# Patient Record
Sex: Female | Born: 1965 | Race: White | Hispanic: Yes | Smoking: Never smoker
Health system: Southern US, Community
[De-identification: ages and names within clinical notes are randomized; demographics above are authoritative.]

## PROBLEM LIST (undated history)

## (undated) DIAGNOSIS — D649 Anemia, unspecified: Secondary | ICD-10-CM

## (undated) DIAGNOSIS — G5603 Carpal tunnel syndrome, bilateral upper limbs: Secondary | ICD-10-CM

## (undated) DIAGNOSIS — E119 Type 2 diabetes mellitus without complications: Secondary | ICD-10-CM

## (undated) HISTORY — DX: Type 2 diabetes mellitus without complications: E11.9

## (undated) HISTORY — PX: FRACTURE SURGERY: SHX138

---

## 1997-10-17 ENCOUNTER — Emergency Department (HOSPITAL_COMMUNITY): Admission: EM | Admit: 1997-10-17 | Discharge: 1997-10-17 | Payer: Self-pay | Admitting: Emergency Medicine

## 1999-09-18 ENCOUNTER — Encounter: Payer: Self-pay | Admitting: Obstetrics & Gynecology

## 1999-09-18 ENCOUNTER — Inpatient Hospital Stay (HOSPITAL_COMMUNITY): Admission: AD | Admit: 1999-09-18 | Discharge: 1999-09-18 | Payer: Self-pay | Admitting: Obstetrics & Gynecology

## 1999-09-19 ENCOUNTER — Ambulatory Visit (HOSPITAL_COMMUNITY): Admission: RE | Admit: 1999-09-19 | Discharge: 1999-09-19 | Payer: Self-pay | Admitting: Obstetrics & Gynecology

## 2000-04-06 ENCOUNTER — Encounter: Payer: Self-pay | Admitting: *Deleted

## 2000-04-06 ENCOUNTER — Inpatient Hospital Stay (HOSPITAL_COMMUNITY): Admission: AD | Admit: 2000-04-06 | Discharge: 2000-04-06 | Payer: Self-pay | Admitting: *Deleted

## 2000-04-20 ENCOUNTER — Encounter: Admission: RE | Admit: 2000-04-20 | Discharge: 2000-04-20 | Payer: Self-pay | Admitting: Obstetrics & Gynecology

## 2000-11-11 ENCOUNTER — Other Ambulatory Visit: Admission: RE | Admit: 2000-11-11 | Discharge: 2000-11-11 | Payer: Self-pay | Admitting: *Deleted

## 2001-01-12 ENCOUNTER — Encounter: Payer: Self-pay | Admitting: *Deleted

## 2001-01-12 ENCOUNTER — Ambulatory Visit (HOSPITAL_COMMUNITY): Admission: RE | Admit: 2001-01-12 | Discharge: 2001-01-12 | Payer: Self-pay | Admitting: *Deleted

## 2001-03-17 ENCOUNTER — Encounter: Admission: RE | Admit: 2001-03-17 | Discharge: 2001-06-15 | Payer: Self-pay | Admitting: *Deleted

## 2001-03-23 ENCOUNTER — Encounter: Admission: RE | Admit: 2001-03-23 | Discharge: 2001-03-23 | Payer: Self-pay | Admitting: *Deleted

## 2001-03-30 ENCOUNTER — Encounter: Admission: RE | Admit: 2001-03-30 | Discharge: 2001-03-30 | Payer: Self-pay

## 2001-04-06 ENCOUNTER — Ambulatory Visit (HOSPITAL_COMMUNITY): Admission: RE | Admit: 2001-04-06 | Discharge: 2001-04-06 | Payer: Self-pay | Admitting: Obstetrics

## 2001-04-13 ENCOUNTER — Encounter: Admission: RE | Admit: 2001-04-13 | Discharge: 2001-04-13 | Payer: Self-pay | Admitting: *Deleted

## 2001-04-20 ENCOUNTER — Encounter: Admission: RE | Admit: 2001-04-20 | Discharge: 2001-04-20 | Payer: Self-pay | Admitting: *Deleted

## 2001-04-27 ENCOUNTER — Encounter: Admission: RE | Admit: 2001-04-27 | Discharge: 2001-04-27 | Payer: Self-pay | Admitting: *Deleted

## 2001-05-04 ENCOUNTER — Encounter: Admission: RE | Admit: 2001-05-04 | Discharge: 2001-05-04 | Payer: Self-pay | Admitting: *Deleted

## 2001-05-11 ENCOUNTER — Encounter: Admission: RE | Admit: 2001-05-11 | Discharge: 2001-05-11 | Payer: Self-pay | Admitting: *Deleted

## 2001-05-18 ENCOUNTER — Encounter: Admission: RE | Admit: 2001-05-18 | Discharge: 2001-05-18 | Payer: Self-pay | Admitting: *Deleted

## 2001-05-18 ENCOUNTER — Inpatient Hospital Stay (HOSPITAL_COMMUNITY): Admission: AD | Admit: 2001-05-18 | Discharge: 2001-05-18 | Payer: Self-pay | Admitting: Obstetrics and Gynecology

## 2001-05-25 ENCOUNTER — Encounter: Admission: RE | Admit: 2001-05-25 | Discharge: 2001-05-25 | Payer: Self-pay | Admitting: *Deleted

## 2001-05-25 ENCOUNTER — Encounter (HOSPITAL_COMMUNITY): Admission: RE | Admit: 2001-05-25 | Discharge: 2001-05-25 | Payer: Self-pay | Admitting: *Deleted

## 2001-05-27 ENCOUNTER — Inpatient Hospital Stay (HOSPITAL_COMMUNITY): Admission: AD | Admit: 2001-05-27 | Discharge: 2001-05-31 | Payer: Self-pay | Admitting: *Deleted

## 2005-04-02 ENCOUNTER — Emergency Department (HOSPITAL_COMMUNITY): Admission: EM | Admit: 2005-04-02 | Discharge: 2005-04-02 | Payer: Self-pay | Admitting: Emergency Medicine

## 2005-09-21 ENCOUNTER — Ambulatory Visit: Payer: Self-pay | Admitting: Family Medicine

## 2005-10-22 ENCOUNTER — Ambulatory Visit: Payer: Self-pay | Admitting: Family Medicine

## 2005-10-27 ENCOUNTER — Ambulatory Visit: Payer: Self-pay | Admitting: *Deleted

## 2005-10-30 ENCOUNTER — Ambulatory Visit: Payer: Self-pay | Admitting: Family Medicine

## 2006-04-21 ENCOUNTER — Emergency Department (HOSPITAL_COMMUNITY): Admission: EM | Admit: 2006-04-21 | Discharge: 2006-04-22 | Payer: Self-pay | Admitting: Emergency Medicine

## 2006-09-07 ENCOUNTER — Ambulatory Visit: Payer: Self-pay | Admitting: Family Medicine

## 2006-09-30 ENCOUNTER — Ambulatory Visit: Payer: Self-pay | Admitting: Family Medicine

## 2006-09-30 LAB — CONVERTED CEMR LAB
Basophils Relative: 0 % (ref 0–1)
Eosinophils Absolute: 0.1 10*3/uL (ref 0.0–0.7)
Eosinophils Relative: 1 % (ref 0–5)
HCT: 39.5 % (ref 36.0–46.0)
Lymphs Abs: 2.4 10*3/uL (ref 0.7–3.3)
MCHC: 31.9 g/dL (ref 30.0–36.0)
MCV: 83.3 fL (ref 78.0–100.0)
Monocytes Relative: 4 % (ref 3–11)
RBC: 4.74 M/uL (ref 3.87–5.11)
WBC: 8.8 10*3/uL (ref 4.0–10.5)

## 2006-10-27 ENCOUNTER — Encounter (INDEPENDENT_AMBULATORY_CARE_PROVIDER_SITE_OTHER): Payer: Self-pay | Admitting: *Deleted

## 2006-11-24 DIAGNOSIS — F341 Dysthymic disorder: Secondary | ICD-10-CM

## 2006-11-24 DIAGNOSIS — E119 Type 2 diabetes mellitus without complications: Secondary | ICD-10-CM | POA: Insufficient documentation

## 2006-11-24 DIAGNOSIS — D649 Anemia, unspecified: Secondary | ICD-10-CM | POA: Insufficient documentation

## 2006-11-24 DIAGNOSIS — E669 Obesity, unspecified: Secondary | ICD-10-CM

## 2007-03-14 ENCOUNTER — Ambulatory Visit: Payer: Self-pay | Admitting: Family Medicine

## 2007-03-14 ENCOUNTER — Encounter (INDEPENDENT_AMBULATORY_CARE_PROVIDER_SITE_OTHER): Payer: Self-pay | Admitting: Family Medicine

## 2007-03-14 LAB — CONVERTED CEMR LAB
BUN: 19 mg/dL (ref 6–23)
CO2: 18 meq/L — ABNORMAL LOW (ref 19–32)
Calcium: 9.6 mg/dL (ref 8.4–10.5)
Chloride: 103 meq/L (ref 96–112)
Creatinine, Ser: 1.03 mg/dL (ref 0.40–1.20)
Glucose, Bld: 83 mg/dL (ref 70–99)

## 2007-07-12 ENCOUNTER — Ambulatory Visit: Payer: Self-pay | Admitting: Family Medicine

## 2007-07-12 LAB — CONVERTED CEMR LAB: Microalb, Ur: 0.57 mg/dL (ref 0.00–1.89)

## 2007-12-27 ENCOUNTER — Ambulatory Visit: Payer: Self-pay | Admitting: Family Medicine

## 2008-01-19 ENCOUNTER — Ambulatory Visit: Payer: Self-pay | Admitting: Internal Medicine

## 2008-01-23 ENCOUNTER — Ambulatory Visit (HOSPITAL_COMMUNITY): Admission: RE | Admit: 2008-01-23 | Discharge: 2008-01-23 | Payer: Self-pay | Admitting: Internal Medicine

## 2008-04-24 ENCOUNTER — Ambulatory Visit: Payer: Self-pay | Admitting: Family Medicine

## 2008-07-31 ENCOUNTER — Ambulatory Visit: Payer: Self-pay | Admitting: Family Medicine

## 2008-10-10 ENCOUNTER — Emergency Department (HOSPITAL_COMMUNITY): Admission: EM | Admit: 2008-10-10 | Discharge: 2008-10-10 | Payer: Self-pay | Admitting: Family Medicine

## 2009-04-16 ENCOUNTER — Emergency Department (HOSPITAL_COMMUNITY): Admission: EM | Admit: 2009-04-16 | Discharge: 2009-04-16 | Payer: Self-pay | Admitting: Emergency Medicine

## 2009-05-16 ENCOUNTER — Ambulatory Visit: Payer: Self-pay | Admitting: Family Medicine

## 2009-05-20 ENCOUNTER — Ambulatory Visit (HOSPITAL_COMMUNITY): Admission: RE | Admit: 2009-05-20 | Discharge: 2009-05-20 | Payer: Self-pay | Admitting: Family Medicine

## 2010-03-24 ENCOUNTER — Encounter (INDEPENDENT_AMBULATORY_CARE_PROVIDER_SITE_OTHER): Payer: Self-pay | Admitting: Family Medicine

## 2010-03-24 LAB — CONVERTED CEMR LAB
ALT: <8 units/L (ref 0–35)
AST: 13 units/L (ref 0–37)
Albumin: 3.8 g/dL (ref 3.5–5.2)
Calcium: 8.8 mg/dL (ref 8.4–10.5)
Chloride: 105 meq/L (ref 96–112)
Creatinine, Ser: 0.64 mg/dL (ref 0.40–1.20)
HDL: 30 mg/dL — ABNORMAL LOW (ref >39)
LDL Cholesterol: 63 mg/dL (ref 0–99)
Potassium: 4.3 meq/L (ref 3.5–5.3)
Sodium: 136 meq/L (ref 135–145)
Total Protein: 7.3 g/dL (ref 6.0–8.3)
VLDL: 24 mg/dL (ref 0–40)

## 2010-05-04 LAB — URINE MICROSCOPIC-ADD ON

## 2010-05-04 LAB — POCT PREGNANCY, URINE: Preg Test, Ur: NEGATIVE

## 2010-05-04 LAB — URINALYSIS, ROUTINE W REFLEX MICROSCOPIC
Bilirubin Urine: NEGATIVE
Glucose, UA: >1000 mg/dL — AB
Hgb urine dipstick: NEGATIVE
Ketones, ur: NEGATIVE mg/dL
Nitrite: NEGATIVE
Protein, ur: NEGATIVE mg/dL
Specific Gravity, Urine: 1.03 (ref 1.005–1.030)
Urobilinogen, UA: 1 mg/dL (ref 0.0–1.0)
pH: 6 (ref 5.0–8.0)

## 2010-05-04 LAB — GLUCOSE, CAPILLARY: Glucose-Capillary: 233 mg/dL — ABNORMAL HIGH (ref 70–99)

## 2010-06-25 ENCOUNTER — Ambulatory Visit (INDEPENDENT_AMBULATORY_CARE_PROVIDER_SITE_OTHER): Payer: Self-pay

## 2010-06-25 ENCOUNTER — Inpatient Hospital Stay (INDEPENDENT_AMBULATORY_CARE_PROVIDER_SITE_OTHER)
Admission: RE | Admit: 2010-06-25 | Discharge: 2010-06-25 | Disposition: A | Discharge status: Home / Self Care | Payer: Self-pay | Source: Ambulatory Visit | Attending: Family Medicine | Admitting: Family Medicine

## 2010-06-25 DIAGNOSIS — M25559 Pain in unspecified hip: Secondary | ICD-10-CM

## 2010-06-25 DIAGNOSIS — G5600 Carpal tunnel syndrome, unspecified upper limb: Secondary | ICD-10-CM

## 2010-08-05 ENCOUNTER — Other Ambulatory Visit: Payer: Self-pay | Admitting: Family Medicine

## 2012-09-29 ENCOUNTER — Ambulatory Visit: Payer: Self-pay | Admitting: Emergency Medicine

## 2012-09-29 VITALS — BP 126/80 | HR 82 | Temp 98.5°F | Resp 18 | Ht 61.0 in | Wt 170.2 lb

## 2012-09-29 DIAGNOSIS — M130 Polyarthritis, unspecified: Secondary | ICD-10-CM

## 2012-09-29 MED ORDER — MELOXICAM 15 MG PO TABS: 15.0000 mg | ORAL_TABLET | Freq: Every day | ORAL | Status: DC

## 2012-09-29 NOTE — Patient Instructions (Addendum)
Artritis - Inespecífica   (Arthritis, Nonspecific)   La artritis se presenta como dolor, enrojecimiento, calor o hinchazón (inflamación) en una articulación. La articulación puede estar rígida o doler al moverla. Pueden verse involucradas una o más articulaciones. Hay diferentes tipos de artritis. Puede ser que el médico no sepa inmediatamente el tipo que usted padece. La causa más frecuente es el desgaste de la articulación (osteoartritis).  CUIDADOS EN EL HOGAR   · Sólo tome los medicamentos según le indique el médico.  · Haga reposo todo el tiempo que pueda.  · Levante (eleve) la articulación si está hinchada.  · Utilice muletas si la articulación que le duele es de la pierna.  · Beba gran cantidad de líquido para mantener el pis (orina) de tono claro o amarillo pálido.  · Siga las instrucciones de su médico sobre la dieta.  · Si siente mucho dolor aplique compresas frías durante 10 a 15 minutos cada hora. Consulte con su médico si puede aplicar compresas calientes.  · Haga ejercicios tal como le indicó el médico.  · Tome una ducha tibia si siente rigidez por la mañana.  · Durante el día, haga movimientos con la articulación que le duele.  SOLICITE AYUDA DE INMEDIATO SI:   · Tiene fiebre.  · Tiene dolor, hinchazón o irritación muy intensos.  · Hay varias articulaciones que le duelen o están hinchadas.  · No mejora con el tratamiento.  · Siente dolor muy intenso en la espalda o debilidad en las piernas.  · No puede controlar su materia fecal (movimientos intestinales) o el pis orina.  · No se siente mejor o empeora luego de 24 horas.  · Los medicamentos le causan efectos adversos.  ASEGÚRESE DE QUE:   · Comprende estas instrucciones.  · Controlará su enfermedad.  · Solicitará ayuda de inmediato si no mejora o si empeora.  Document Released: 07/28/2011  ExitCare® Patient Information ©2014 ExitCare, LLC.

## 2012-09-29 NOTE — Progress Notes (Signed)
[facility] [address] [place] [address] [phone]  Date:  09/29/2012   Name:  Margaret Pace   DOB:  [birth year]   MRN:  [id number]  PCP:  No PCP Per Patient    Chief Complaint: Joints pain - sometime is hard to walk;   History of Present Illness:  Margaret Pace is a 47 y.o. very pleasant female patient who presents with the following:  Patient has a history that is somewhat confusing.  Initially said that she has pain in her finger, wrist, elbow, shoulder, ankle and knee joints that started yesterday.  Then says that she was seen recently at [facility] next door and had labs done that were negative so nothing was done for her.  She then said she was experiencing the pains for the past year or more.  Not taking any prescription medication only OTC with no improvement.  Says her joints become red, tender and swell but no fever or chills.  No history of documented joint disease.  No history of tick exposure.  No improvement with over the counter medications or other home remedies.   Patient Active Problem List   Diagnosis Date Noted  . DIABETES MELLITUS, TYPE II, UNCONTROLLED 11/24/2006  . OBESITY, MODERATE 11/24/2006  . ANEMIA NOS 11/24/2006  . ANXIETY DEPRESSION 11/24/2006    Past Medical History  Diagnosis Date  . Diabetes mellitus without complication     History reviewed. No pertinent past surgical history.  History  Substance Use Topics  . Smoking status: Never Smoker   . Smokeless tobacco: Not on file  . Alcohol Use: No    Family History  Problem Relation Age of Onset  . Diabetes Mother   . Diabetes Sister   . Diabetes Brother   . Diabetes Sister   . Diabetes Sister   . Diabetes Brother     Not on File  Medication list has been reviewed and updated.  No current outpatient prescriptions on file prior to visit.   No current facility-administered medications on file prior to visit.    Review of Systems:  As per HPI,  otherwise negative.    Physical Examination: Filed Vitals:   09/29/12 1550  BP: 126/80  Pulse: 82  Temp: 98.5 F (36.9 C)  Resp: 18   Filed Vitals:   09/29/12 1550  Height: 5\' 1"  (1.549 m)  Weight: 170 lb 3.2 oz (77.202 kg)   Body mass index is 32.18 kg/(m^2). Ideal Body Weight: Weight in (lb) to have BMI = 25: 132   GEN: WDWN, NAD, Non-toxic, Alert & Oriented x 3 HEENT: Atraumatic, Normocephalic.  Ears and Nose: No external deformity. EXTR: No clubbing/cyanosis/edema NEURO: Normal gait.  PSYCH: Normally interactive. Conversant. Not depressed or anxious appearing.  Calm demeanor.  Finger, wrist and elbow joints. No erythema, swelling or deviation.  Some tenderness.  Full PROM.  No crepitus.  No rash.  Assessment and Plan: Polyarthritis Unable to obtain labs from other practice today will order them mobic Follow up in one month    Signed,  Phillips Odor, MD

## 2013-08-07 ENCOUNTER — Emergency Department (HOSPITAL_BASED_OUTPATIENT_CLINIC_OR_DEPARTMENT_OTHER)
Admission: EM | Admit: 2013-08-07 | Discharge: 2013-08-08 | Disposition: A | Discharge status: Home / Self Care | Payer: Self-pay | Attending: Emergency Medicine | Admitting: Emergency Medicine

## 2013-08-07 ENCOUNTER — Encounter (HOSPITAL_BASED_OUTPATIENT_CLINIC_OR_DEPARTMENT_OTHER): Payer: Self-pay | Admitting: Emergency Medicine

## 2013-08-07 DIAGNOSIS — Z791 Long term (current) use of non-steroidal anti-inflammatories (NSAID): Secondary | ICD-10-CM | POA: Insufficient documentation

## 2013-08-07 DIAGNOSIS — Z79899 Other long term (current) drug therapy: Secondary | ICD-10-CM | POA: Insufficient documentation

## 2013-08-07 DIAGNOSIS — M79642 Pain in left hand: Secondary | ICD-10-CM

## 2013-08-07 DIAGNOSIS — M25539 Pain in unspecified wrist: Secondary | ICD-10-CM | POA: Insufficient documentation

## 2013-08-07 DIAGNOSIS — E119 Type 2 diabetes mellitus without complications: Secondary | ICD-10-CM | POA: Insufficient documentation

## 2013-08-07 NOTE — ED Notes (Signed)
Pt c/o bil hand pain and numbness  X 2 days

## 2013-08-07 NOTE — ED Provider Notes (Signed)
CSN: [id number]     Arrival date & time 08/07/13  2338 History   First MD Initiated Contact with Patient 08/07/13 2350     Chief Complaint  Patient presents with  . Hand Pain     (Consider location/radiation/quality/duration/timing/severity/associated sxs/prior Treatment) Patient is a 48 y.o. female presenting with hand pain. The history is provided by the patient. No language interpreter was used.  Hand Pain This is a new problem. The current episode started today. The problem occurs constantly. The problem has been gradually worsening. Associated symptoms include joint swelling and myalgias. Nothing aggravates the symptoms. She has tried nothing for the symptoms. The treatment provided moderate relief.  Pt complains of swelling and pain in wrist.   Pt has had multiple areas   Past Medical History  Diagnosis Date  . Diabetes mellitus without complication    History reviewed. No pertinent past surgical history. Family History  Problem Relation Age of Onset  . Diabetes Mother   . Diabetes Sister   . Diabetes Brother   . Diabetes Sister   . Diabetes Sister   . Diabetes Brother    History  Substance Use Topics  . Smoking status: Never Smoker   . Smokeless tobacco: Not on file  . Alcohol Use: No   OB History   Grav Para Term Preterm Abortions TAB SAB Ect Mult Living                 Review of Systems  Musculoskeletal: Positive for joint swelling and myalgias.  All other systems reviewed and are negative.     Allergies  Review of patient's allergies indicates no known allergies.  Home Medications   Prior to Admission medications   Medication Sig Start Date End Date Taking? Authorizing Provider  metFORMIN (GLUCOPHAGE) 850 MG tablet Take 850 mg by mouth 2 (two) times daily with a meal.   Yes Historical Provider, MD  meloxicam (MOBIC) 15 MG tablet Take 1 tablet (15 mg total) by mouth daily. 09/29/12   Phillips OdorJeffery Anderson, MD  naproxen sodium (ANAPROX) 220 MG tablet Take 220  mg by mouth.    Historical Provider, MD   BP 156/69  Pulse 70  Temp(Src) 97.8 F (36.6 C) (Oral)  Resp 20  Wt 180 lb (81.647 kg)  SpO2 99%  LMP 06/26/2013 Physical Exam  Nursing note and vitals reviewed. Constitutional: She appears well-developed.  HENT:  Head: Normocephalic.  Musculoskeletal: She exhibits tenderness.  Swollen tender wrist,  Decreased range of motion,  nv and ns intact  Neurological: She is alert.  Skin: Skin is warm.    ED Course  Procedures (including critical care time) Labs Review Labs Reviewed - No data to display  Imaging Review No results found.   EKG Interpretation None      MDM   Final diagnoses:  Hand pain, left    Wrist splint Hydrocodone ibuprofen    Elson AreasLeslie K Sofia, PA-C 08/08/13 0129

## 2013-08-08 ENCOUNTER — Emergency Department (HOSPITAL_BASED_OUTPATIENT_CLINIC_OR_DEPARTMENT_OTHER): Payer: Self-pay

## 2013-08-08 MED ORDER — HYDROCODONE-ACETAMINOPHEN 5-325 MG PO TABS
2.0000 | ORAL_TABLET | Freq: Once | ORAL | Status: AC
  Administered 2013-08-08: 2 via ORAL
  Filled 2013-08-08: qty 2

## 2013-08-08 MED ORDER — IBUPROFEN 800 MG PO TABS
800.0000 mg | ORAL_TABLET | Freq: Once | ORAL | Status: AC
  Administered 2013-08-08: 800 mg via ORAL
  Filled 2013-08-08: qty 1

## 2013-08-08 MED ORDER — HYDROCODONE-ACETAMINOPHEN 5-325 MG PO TABS: 2.0000 | ORAL_TABLET | ORAL | Status: DC | PRN

## 2013-08-08 MED ORDER — HYDROMORPHONE HCL PF 1 MG/ML IJ SOLN
INTRAMUSCULAR | Status: AC
  Administered 2013-08-08: 1 mg via INTRAMUSCULAR
  Filled 2013-08-08: qty 1

## 2013-08-08 MED ORDER — HYDROMORPHONE HCL PF 1 MG/ML IJ SOLN
1.0000 mg | Freq: Once | INTRAMUSCULAR | Status: AC
  Administered 2013-08-08: 1 mg via INTRAMUSCULAR

## 2013-08-08 MED ORDER — IBUPROFEN 800 MG PO TABS: 800.0000 mg | ORAL_TABLET | Freq: Three times a day (TID) | ORAL | Status: DC

## 2013-08-08 NOTE — Discharge Instructions (Signed)

## 2013-08-08 NOTE — ED Provider Notes (Signed)
Medical screening examination/treatment/procedure(s) were performed by non-physician practitioner and as supervising physician I was immediately available for consultation/collaboration.   EKG Interpretation None       April K Palumbo-Rasch, MD 08/08/13 [phone]

## 2013-08-09 ENCOUNTER — Ambulatory Visit (INDEPENDENT_AMBULATORY_CARE_PROVIDER_SITE_OTHER): Payer: Self-pay | Admitting: Family Medicine

## 2013-08-09 ENCOUNTER — Encounter: Payer: Self-pay | Admitting: Family Medicine

## 2013-08-09 VITALS — BP 128/77 | HR 64 | Ht 60.0 in | Wt 157.2 lb

## 2013-08-09 DIAGNOSIS — G5603 Carpal tunnel syndrome, bilateral upper limbs: Secondary | ICD-10-CM

## 2013-08-09 DIAGNOSIS — M25531 Pain in right wrist: Secondary | ICD-10-CM

## 2013-08-09 DIAGNOSIS — G5600 Carpal tunnel syndrome, unspecified upper limb: Secondary | ICD-10-CM

## 2013-08-09 DIAGNOSIS — M25532 Pain in left wrist: Secondary | ICD-10-CM

## 2013-08-09 DIAGNOSIS — M25539 Pain in unspecified wrist: Secondary | ICD-10-CM

## 2013-08-09 MED ORDER — PREDNISONE (PAK) 10 MG PO TABS: ORAL_TABLET | ORAL | Status: DC

## 2013-08-09 NOTE — Patient Instructions (Addendum)
You have carpal tunnel syndrome. Wear the wrist splint at nighttime and as often as possible during the day Take prednisone as directed for 6 days. DO NOT take ibuprofen or naproxen while you're taking the prednisone. Corticosteroid injection is a consideration to help with pain and inflammation - call me if you're not improving next week and we can do this. If not improving, will consider nerve conduction studies to assess severity. Otherwise follow-up with me in 1 month.

## 2013-08-14 ENCOUNTER — Encounter: Payer: Self-pay | Admitting: Family Medicine

## 2013-08-14 DIAGNOSIS — M25532 Pain in left wrist: Secondary | ICD-10-CM

## 2013-08-14 DIAGNOSIS — M25531 Pain in right wrist: Secondary | ICD-10-CM | POA: Insufficient documentation

## 2013-08-14 NOTE — Progress Notes (Signed)
Patient ID: [name], female   DOB: 07/30/1965, 48 y.o.   MRN: [id number]  PCP: No PCP Per Patient  Subjective:   HPI: Patient is a 48 y.o. female here for bilateral hand pain.  Patient reports she's had pain in left > right hand for about 3 months. 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 Both go to sleep at times and swell. Pain worse at nighttime. Taking pain medicine but no other treatment.  Past Medical History  Diagnosis Date  . Diabetes mellitus without complication     Current Outpatient Prescriptions on File Prior to Visit  Medication Sig Dispense Refill  . HYDROcodone-acetaminophen (NORCO/VICODIN) 5-325 MG per tablet Take 2 tablets by mouth every 4 (four) hours as needed.  20 tablet  0  . metFORMIN (GLUCOPHAGE) 850 MG tablet Take 850 mg by mouth 2 (two) times daily with a meal.       No current facility-administered medications on file prior to visit.    History reviewed. No pertinent past surgical history.  No Known Allergies  History   Social History  . Marital Status: Married    Spouse Name: N/A    Number of Children: N/A  . Years of Education: N/A   Occupational History  . Not on file.   Social History Main Topics  . Smoking status: Never Smoker   . Smokeless tobacco: Not on file  . Alcohol Use: No  . Drug Use: No  . Sexual Activity: Yes   Other Topics Concern  . Not on file   Social History Narrative  . No narrative on file    Family History  Problem Relation Age of Onset  . Diabetes Mother   . Diabetes Sister   . Diabetes Brother   . Diabetes Sister   . Diabetes Sister   . Diabetes Brother     BP 128/77  Pulse 64  Ht 5' (1.524 m)  Wt 157 lb 3.2 oz (71.305 kg)  BMI 30.70 kg/m2  LMP 06/26/2013  Review of Systems: See HPI above.    Objective:  Physical Exam:  Gen: NAD  Bilateral hands/wrists: No gross deformity, swelling, bruising, atrophy. TTP volar wrists over carpal tunnels. FROM digits and  wrist. 5-/5 with finger abduction.  Otherwise 5/5 strength other motions. Sensation diminished to light touch all digits L > R.    Assessment & Plan:  1. Bilateral wrist pain - consistent with carpal tunnel syndrome.  Start with wrist brace on the left, consider one on the right.  Prednisone dose pack x 6 days - discussed risks of increased blood sugars with her being diabetic.  Consider injection, NCVs if not improving.  F/u in 1 month.

## 2013-08-14 NOTE — Assessment & Plan Note (Signed)
consistent with carpal tunnel syndrome.  Start with wrist brace on the left, consider one on the right.  Prednisone dose pack x 6 days - discussed risks of increased blood sugars with her being diabetic.  Consider injection, NCVs if not improving.  F/u in 1 month.

## 2013-09-11 ENCOUNTER — Ambulatory Visit: Payer: Self-pay | Admitting: Family Medicine

## 2013-11-12 ENCOUNTER — Encounter (HOSPITAL_BASED_OUTPATIENT_CLINIC_OR_DEPARTMENT_OTHER): Payer: Self-pay | Admitting: Emergency Medicine

## 2013-11-12 ENCOUNTER — Emergency Department (HOSPITAL_BASED_OUTPATIENT_CLINIC_OR_DEPARTMENT_OTHER)
Admission: EM | Admit: 2013-11-12 | Discharge: 2013-11-13 | Disposition: A | Discharge status: Home / Self Care | Payer: Self-pay | Attending: Emergency Medicine | Admitting: Emergency Medicine

## 2013-11-12 DIAGNOSIS — Z7952 Long term (current) use of systemic steroids: Secondary | ICD-10-CM | POA: Insufficient documentation

## 2013-11-12 DIAGNOSIS — Z79899 Other long term (current) drug therapy: Secondary | ICD-10-CM | POA: Insufficient documentation

## 2013-11-12 DIAGNOSIS — G5602 Carpal tunnel syndrome, left upper limb: Secondary | ICD-10-CM | POA: Insufficient documentation

## 2013-11-12 DIAGNOSIS — E119 Type 2 diabetes mellitus without complications: Secondary | ICD-10-CM | POA: Insufficient documentation

## 2013-11-12 DIAGNOSIS — G5603 Carpal tunnel syndrome, bilateral upper limbs: Secondary | ICD-10-CM

## 2013-11-12 DIAGNOSIS — G5601 Carpal tunnel syndrome, right upper limb: Secondary | ICD-10-CM | POA: Insufficient documentation

## 2013-11-12 MED ORDER — PREDNISONE 50 MG PO TABS
60.0000 mg | ORAL_TABLET | Freq: Once | ORAL | Status: AC
  Administered 2013-11-12: 60 mg via ORAL
  Filled 2013-11-12 (×2): qty 1

## 2013-11-12 MED ORDER — HYDROCODONE-ACETAMINOPHEN 5-325 MG PO TABS: 1.0000 | ORAL_TABLET | Freq: Four times a day (QID) | ORAL | Status: DC | PRN

## 2013-11-12 MED ORDER — PREDNISONE 10 MG PO TABS: 20.0000 mg | ORAL_TABLET | Freq: Every day | ORAL | Status: DC

## 2013-11-12 MED ORDER — OXYCODONE-ACETAMINOPHEN 5-325 MG PO TABS
1.0000 | ORAL_TABLET | Freq: Once | ORAL | Status: AC
  Administered 2013-11-12: 1 via ORAL
  Filled 2013-11-12: qty 1

## 2013-11-12 MED ORDER — HYDROMORPHONE HCL 1 MG/ML IJ SOLN
1.0000 mg | Freq: Once | INTRAMUSCULAR | Status: AC
  Administered 2013-11-13: 1 mg via INTRAMUSCULAR
  Filled 2013-11-12: qty 1

## 2013-11-12 NOTE — ED Provider Notes (Signed)
CSN: [id number]     Arrival date & time 11/12/13  2233 History   First MD Initiated Contact with Patient 11/12/13 2235     Chief Complaint  Patient presents with  . Wrist Pain    bilateral wrist     (Consider location/radiation/quality/duration/timing/severity/associated sxs/prior Treatment) Patient is a 48 y.o. female presenting with wrist pain. The history is provided by the patient. The history is limited by a language barrier. A language interpreter was used.  Wrist Pain This is a recurrent problem. The problem occurs constantly. The problem has been unchanged. Associated symptoms include joint swelling. Exacerbated by: movement of wrist. She has tried NSAIDs for the symptoms. The treatment provided no relief.   Roxanna Mcever is a 48 y.o. female who presents to the ED with wrist pain that has been off and on x 4 months. She was initially evaluated here in the ED and had x-rays and then referred to Dr. Pearletha Forge. She has been seeing him and was treated for Carpal tunnel. She has been taking ibuprofen and wearing her wrist splints but the pain is worse. She is here for pain management until she can follow up with Dr. Pearletha Forge. She has not had any narcotics for pain since her visit here 08/08/13. Patient states that today the pain is severe just as it was on her first visit here. She does not have a follow up appointment with Dr. Pearletha Forge.   Past Medical History  Diagnosis Date  . Diabetes mellitus without complication    History reviewed. No pertinent past surgical history. Family History  Problem Relation Age of Onset  . Diabetes Mother   . Diabetes Sister   . Diabetes Brother   . Diabetes Sister   . Diabetes Sister   . Diabetes Brother    History  Substance Use Topics  . Smoking status: Never Smoker   . Smokeless tobacco: Not on file  . Alcohol Use: No   OB History   Grav Para Term Preterm Abortions TAB SAB Ect Mult Living                 Review of Systems  Musculoskeletal:  Positive for joint swelling.  Negative except as stated in HPI   Allergies  Review of patient's allergies indicates no known allergies.  Home Medications   Prior to Admission medications   Medication Sig Start Date End Date Taking? Authorizing Provider  HYDROcodone-acetaminophen (NORCO/VICODIN) 5-325 MG per tablet Take 2 tablets by mouth every 4 (four) hours as needed. 08/08/13   Elson Areas, PA-C  metFORMIN (GLUCOPHAGE) 850 MG tablet Take 850 mg by mouth 2 (two) times daily with a meal.    Historical Provider, MD  predniSONE (STERAPRED UNI-PAK) 10 MG tablet 6 tabs po day 1, 5 tabs po day 2, 4 tabs po day 3, 3 tabs po day 4, 2 tabs po day 5, 1 tab po day 6 08/09/13   Lenda Kelp, MD   BP 164/64  Pulse 62  Temp(Src) 97.8 F (36.6 C) (Oral)  Resp 18  SpO2 100%  LMP 11/12/2013 Physical Exam  Nursing note and vitals reviewed. Constitutional: She is oriented to person, place, and time. She appears well-developed and well-nourished. No distress.  HENT:  Head: Normocephalic.  Eyes: Conjunctivae and EOM are normal.  Neck: Neck supple.  Cardiovascular: Normal rate.   Pulmonary/Chest: Effort normal.  Musculoskeletal:  Bilateral wrist tenderness with range of motion. Left > right.  Radial pulses equal, adequate circulation.   Neurological:  She is alert and oriented to person, place, and time. No cranial nerve deficit.  Skin: Skin is warm and dry.  Psychiatric: She has a normal mood and affect. Her behavior is normal.    ED Course  Procedures  Percocet 5/325, Prednisone 60 mg. Without relief Dilaudid 1 mg. IM and Zofran 4 mg ODT Patient getting relief MDM  48 y.o. female with bilateral wrist pain and has been evaluated and treated for ortho by Dr. Pearletha ForgeHudnall. Stable for discharge without neurovascular deficits. She is to wear her wrist splints, take the medication we give her as directed and go for a follow up visit with Dr. Pearletha ForgeHudnall or to the hand surgeon. Using the language line  discharge instructions were given to the patient in detail and she voices understanding. All questions answered. She will return as needed for problems.    Medication List    ASK your doctor about these medications       HYDROcodone-acetaminophen 5-325 MG per tablet  Commonly known as:  NORCO/VICODIN  Take 2 tablets by mouth every 4 (four) hours as needed.  Ask about: Which instructions should I use?     HYDROcodone-acetaminophen 5-325 MG per tablet  Commonly known as:  NORCO  Take 1 tablet by mouth every 6 (six) hours as needed for moderate pain.  Ask about: Which instructions should I use?     metFORMIN 850 MG tablet  Commonly known as:  GLUCOPHAGE  Take 850 mg by mouth 2 (two) times daily with a meal.     predniSONE 10 MG tablet  Commonly known as:  STERAPRED UNI-PAK  6 tabs po day 1, 5 tabs po day 2, 4 tabs po day 3, 3 tabs po day 4, 2 tabs po day 5, 1 tab po day 6  Ask about: Which instructions should I use?     predniSONE 10 MG tablet  Commonly known as:  DELTASONE  Take 2 tablets (20 mg total) by mouth daily.  Ask about: Which instructions should I use?           [address]Jacee Enerson NomeM Helayna Dun, [place] 11/13/13 [phone]

## 2013-11-12 NOTE — ED Notes (Signed)
Pt reports hx of carpal tunnel in bilateral wrist with pain from mid forearm to hands  OTC meds ineffective and pain has become unbearable tonight

## 2013-11-12 NOTE — Discharge Instructions (Signed)
Sndrome del tnel carpiano  (Carpal Tunnel Syndrome)  El tnel carpiano es un espacio estrecho ubicado en el lado palmar de la mueca. Est formado por los huesos de la mueca y los ligamentos. Los nervios, vasos sanguneos y tendones pasan a travs del tnel carpiano. Los movimientos de la mueca o ciertas enfermedades pueden causar hinchazn del tnel. Esta hinchazn comprime el nervio principal en la mueca ((nervio mediano) y ocasiona un trastorno doloroso que se denomina sndrome del tnel carpiano. CAUSAS   Movimientos repetidos de la mueca.  Lesiones en la mueca.  Ciertas enfermedades como la artritis, la diabetes, el alcoholismo, el hipertiroidismo o la insuficiencia renal.  Obesidad.  Embarazo. SNTOMAS   Sensacin de "pinchazos" en los dedos o la mano.  Hormigueo o entumecimiento en los dedos o en la mano.  Sensacin dolorosa en todo el brazo.  Dolor en la mueca que sube por el brazo hasta el hombro.  Dolor que baja por la mano o los dedos.  Sensacin de debilidad en las manos. DIAGNSTICO  El mdico le har una historia clnica y un examen fsico. Puede ser necesario hacer un electromiograma. Esta prueba mide las seales elctricas enviadas por los msculos. Generalmente el paso de las seales elctricas es impedido por el sndrome del tnel carpiano. Tambin puede ser necesario que le tomen radiografas.  TRATAMIENTO  El sndrome del tnel carpiano puede curarse espontneamente sin tratamiento. El mdico le indicar el uso de un cabestrillo para la mueca o que tome medicamentos como antiinflamatorios no esteroides. Las inyecciones de cortisona pueden ayudar. A veces es necesaria la ciruga para liberar el nervio comprimido.  INSTRUCCIONES PARA EL CUIDADO EN EL HOGAR   Tome todos los medicamentos segn le indic su mdico. Slo tome medicamentos de venta libre o recetados para calmar el dolor, las molestias o bajar la fiebre segn las indicaciones de su mdico.  Si  le aconsejaron usar un cabestrillo para evitar que la mueca se doble, selo como le indicaron. Es importante que use el cabestrillo durante la noche. selo mientras sienta dolor o adormecimiento en la mano, el brazo o la mueca. Esto puede durar entre 1 y 2 meses.  Haga reposar la mueca de toda actividad que le cause dolor. Si sus sntomas estn relacionados con el trabajo, deber conversar con su empleador acerca de la posibilidad de cambiar a una tarea que no requiera el uso de la mueca.  Aplique hielo en la mueca despus de los perodos prolongados de actividad.  Ponga el hielo en una bolsa plstica.  Colquese una toalla entre la piel y la bolsa de hielo.  Deje el hielo en el lugar durante 15 a 20 minutos, 3 a 4 veces por da.  Cumpla con todas las visitas de control, segn le indique su mdico. Aqu se incluyen derivaciones a un ortopedista, fisioterapia y rehabilitacin. Toda demora en obtener la asistencia necesaria puede dar como resultado una demora o fracaso en la curacin. SOLICITE ATENCIN MDICA DE INMEDIATO SI:   Desarrolla nuevos e inexplicables sntomas.  Los sntomas actuales empeoran y la medicacin no los controla. ASEGRESE DE QUE:   Comprende estas instrucciones.  Controlar su enfermedad.  Solicitar ayuda de inmediato si no mejora o si empeora. Document Released: 01/26/2005 Document Revised: 10/21/2011 ExitCare Patient Information 2015 ExitCare, LLC. This information is not intended to replace advice given to you by your health care provider. Make sure you discuss any questions you have with your health care provider.  

## 2013-11-13 MED ORDER — ONDANSETRON 4 MG PO TBDP: 4.0000 mg | ORAL_TABLET | Freq: Once | ORAL | Status: DC

## 2013-11-13 MED ORDER — ONDANSETRON 4 MG PO TBDP
4.0000 mg | ORAL_TABLET | Freq: Once | ORAL | Status: AC
  Administered 2013-11-13: 4 mg via ORAL
  Filled 2013-11-13: qty 1

## 2013-11-15 NOTE — ED Provider Notes (Signed)
Medical screening examination/treatment/procedure(s) were performed by non-physician practitioner and as supervising physician I was immediately available for consultation/collaboration.   EKG Interpretation None        Elwin MochaBlair Johncarlo Maalouf, MD 11/15/13 [phone]

## 2014-02-27 ENCOUNTER — Emergency Department (HOSPITAL_BASED_OUTPATIENT_CLINIC_OR_DEPARTMENT_OTHER)
Admission: EM | Admit: 2014-02-27 | Discharge: 2014-02-27 | Disposition: A | Discharge status: Home / Self Care | Payer: Self-pay | Attending: Emergency Medicine | Admitting: Emergency Medicine

## 2014-02-27 ENCOUNTER — Encounter (HOSPITAL_BASED_OUTPATIENT_CLINIC_OR_DEPARTMENT_OTHER): Payer: Self-pay | Admitting: Emergency Medicine

## 2014-02-27 DIAGNOSIS — R202 Paresthesia of skin: Secondary | ICD-10-CM | POA: Insufficient documentation

## 2014-02-27 DIAGNOSIS — Z79899 Other long term (current) drug therapy: Secondary | ICD-10-CM | POA: Insufficient documentation

## 2014-02-27 DIAGNOSIS — Z7952 Long term (current) use of systemic steroids: Secondary | ICD-10-CM | POA: Insufficient documentation

## 2014-02-27 DIAGNOSIS — G5600 Carpal tunnel syndrome, unspecified upper limb: Secondary | ICD-10-CM | POA: Insufficient documentation

## 2014-02-27 DIAGNOSIS — E119 Type 2 diabetes mellitus without complications: Secondary | ICD-10-CM | POA: Insufficient documentation

## 2014-02-27 MED ORDER — TRAMADOL HCL 50 MG PO TABS: 50.0000 mg | ORAL_TABLET | Freq: Four times a day (QID) | ORAL | Status: DC | PRN

## 2014-02-27 NOTE — ED Notes (Signed)
C/o bilat hand pain and numbness X 3 weeks, is worsening, no meds pta, A/O X4, ambulatory and in NAD

## 2014-02-27 NOTE — Discharge Instructions (Signed)
Sndrome del tnel carpiano  (Carpal Tunnel Syndrome)  El tnel carpiano es un rea que se encuentra debajo la piel de la palma de su [place]. A travs del tnel pasan nervios, vasos sanguneos y tejidos resistentes (tendones). El tnel puede inflamarse (hincharse). Si esto ocurre, un nervio puede quedar pellizcado en la [place]. Esto es lo que causa el sndrome del tnel carpiano.  CUIDADOS EN EL HOGAR   Tome los medicamentos como le indic el mdico.  Si le indicaron un cabestrillo, selo segn las indicaciones. selos a la noche o cuando su mdico le indique.  Haga descansar la [place] de la actividad que le Sports administratorproduce dolor.  Ponga hielo en la mueca despus de largos perodos de [place]ad de la misma.  Ponga el hielo en una bolsa plstica.  Colquese una toalla entre la piel y la bolsa de hielo.  Deje el hielo durante 15 a 20 minutos, 3 a 4 veces por da.  Cumpla con los controles mdicos segn las indicaciones. SOLICITE AYUDA DE INMEDIATO SI:   Tiene nuevos sntomas que no puede explicar.  Siente un dolor ms intenso y los medicamentos no Winn-Dixiehacen efecto. ASEGRESE DE QUE:   Comprende estas instrucciones.  Controlar su enfermedad.  Solicitar ayuda de inmediato si no mejora o si empeora. Document Released: 01/15/2011 Document Revised: 04/20/2011 [facility] PaExitCare Patient Information 2015 [place], [place]. This information is not intended to replace advice given to you by your health care provider. Make sure you discuss any questions you have with your health care provider.

## 2014-02-27 NOTE — ED Provider Notes (Signed)
CSN: [id number]     Arrival date & time 02/27/14  1503 History   First MD Initiated Contact with Patient 02/27/14 1519     Chief Complaint  Patient presents with  . Hand Pain     (Consider location/radiation/quality/duration/timing/severity/associated sxs/prior Treatment) HPI Comments: Pt comes in with c/o bilateral hand and wrist pain and some intermittent numbness. Pt states that this is a recurring problem and she is getting not help with the symptoms. Pt states that she has been told that she likely has carpal tunnel and she was wearing bilateral wrist splints which helped but she hasn't been using them recently. States that symptoms started again three weeks ago. Denies swelling or redness to the area. Hasn't taken anything for the symptoms  The history is provided by the patient. A language interpreter was used.    Past Medical History  Diagnosis Date  . Diabetes mellitus without complication    History reviewed. No pertinent past surgical history. Family History  Problem Relation Age of Onset  . Diabetes Mother   . Diabetes Sister   . Diabetes Brother   . Diabetes Sister   . Diabetes Sister   . Diabetes Brother    History  Substance Use Topics  . Smoking status: Never Smoker   . Smokeless tobacco: Not on file  . Alcohol Use: No   OB History    No data available     Review of Systems  All other systems reviewed and are negative.     Allergies  Review of patient's allergies indicates no known allergies.  Home Medications   Prior to Admission medications   Medication Sig Start Date End Date Taking? Authorizing Provider  HYDROcodone-acetaminophen (NORCO) 5-325 MG per tablet Take 1 tablet by mouth every 6 (six) hours as needed for moderate pain. 11/12/13   Hope Orlene OchM Neese, NP  HYDROcodone-acetaminophen (NORCO/VICODIN) 5-325 MG per tablet Take 2 tablets by mouth every 4 (four) hours as needed. 08/08/13   Elson AreasLeslie K Sofia, PA-C  metFORMIN (GLUCOPHAGE) 850 MG tablet Take  850 mg by mouth 2 (two) times daily with a meal.    Historical Provider, MD  predniSONE (DELTASONE) 10 MG tablet Take 2 tablets (20 mg total) by mouth daily. 11/12/13   Hope Orlene OchM Neese, NP  predniSONE (STERAPRED UNI-PAK) 10 MG tablet 6 tabs po day 1, 5 tabs po day 2, 4 tabs po day 3, 3 tabs po day 4, 2 tabs po day 5, 1 tab po day 6 08/09/13   Lenda KelpShane R Hudnall, MD   BP 137/71 mmHg  Pulse 62  Temp(Src) 97.7 F (36.5 C) (Oral)  Resp 16  Ht 5\' 4"  (1.626 m)  Wt 157 lb (71.215 kg)  BMI 26.94 kg/m2  SpO2 100%  LMP 11/12/2013 Physical Exam  Constitutional: She is oriented to person, place, and time. She appears well-developed and well-nourished.  Cardiovascular: Normal rate and regular rhythm.   Pulmonary/Chest: Effort normal and breath sounds normal.  Musculoskeletal:  No swelling redness or warmth to the area. Equal grip strength bilaterally. Pt has good sensation here today  Neurological: She is alert and oriented to person, place, and time. She exhibits normal muscle tone. Coordination normal.  Skin: Skin is warm and dry.  Nursing note and vitals reviewed.   ED Course  Procedures (including critical care time) Labs Review Labs Reviewed - No data to display  Imaging Review No results found.   EKG Interpretation None      MDM   Final diagnoses:  Carpal tunnel syndrome, unspecified laterality  Paresthesia    Discussed follow up with DR. Hudnall. Pt given ultram for pain stressed importance of the braces.    Teressa Lower, NP 02/27/14 1545  Vida Roller, MD 02/28/14 [phone]

## 2014-03-01 ENCOUNTER — Ambulatory Visit (INDEPENDENT_AMBULATORY_CARE_PROVIDER_SITE_OTHER): Payer: Self-pay | Admitting: Family Medicine

## 2014-03-01 ENCOUNTER — Encounter: Payer: Self-pay | Admitting: Family Medicine

## 2014-03-01 VITALS — BP 123/75 | HR 70 | Ht 61.0 in | Wt 155.0 lb

## 2014-03-01 DIAGNOSIS — M79641 Pain in right hand: Secondary | ICD-10-CM

## 2014-03-01 DIAGNOSIS — M25531 Pain in right wrist: Secondary | ICD-10-CM

## 2014-03-01 DIAGNOSIS — M79642 Pain in left hand: Secondary | ICD-10-CM

## 2014-03-01 DIAGNOSIS — M25532 Pain in left wrist: Secondary | ICD-10-CM

## 2014-03-01 MED ORDER — METHYLPREDNISOLONE ACETATE 40 MG/ML IJ SUSP
40.0000 mg | Freq: Once | INTRAMUSCULAR | Status: AC
  Administered 2014-03-01: 40 mg via INTRA_ARTICULAR

## 2014-03-01 NOTE — Patient Instructions (Signed)
You have carpal tunnel syndrome. Wear the wrist splint at nighttime and as often as possible during the day You were given cortisone injections into both areas. If not improving, will consider nerve conduction studies to assess severity. Follow-up with me in 1 month.

## 2014-03-06 NOTE — Assessment & Plan Note (Signed)
consistent with carpal tunnel syndrome.  Continue with wrist braces.  Prednisone without lasting benefits.  Bilateral carpal tunnel injections performed today.  F/u in 1 month.  Consider NCVs/EMGs if not improving.  After informed written consent patient was seated on exam table.  Ultrasound used to identify median nerve and ulnar artery volar left wrist, marked then prepped with alcohol swab and left carpal tunnel injected with 2:1 marcaine: depomedrol.  Patient tolerated procedure well without immediate complications.  After informed written consent patient was seated on exam table.  Ultrasound used to identify median nerve and ulnar artery volar right wrist, marked then prepped with alcohol swab and right carpal tunnel injected with 2:1 marcaine: depomedrol.  Patient tolerated procedure well without immediate complications.

## 2014-03-06 NOTE — Progress Notes (Signed)
Patient ID: Margaret BreedMaria Pace, female   DOB: 08/11/1965, 49 y.o.   MRN: [id number]  PCP: No PCP Per Patient  Subjective:   HPI: Patient is a 49 y.o. female here for bilateral hand pain.  08/09/13: Patient reports she's had pain in left > right hand for about 3 months. 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 Both go to sleep at times and swell. Pain worse at nighttime. Taking pain medicine but no other treatment.  03/01/14: Patient reports she has been struggling with pain in both hands. Associated with numbness, tingling into thumb index and middle digits. Difficulty making a fist. Associated swelling. Left hand worse than right. Tried wrist braces at bedtime.  Past Medical History  Diagnosis Date  . Diabetes mellitus without complication     Current Outpatient Prescriptions on File Prior to Visit  Medication Sig Dispense Refill  . HYDROcodone-acetaminophen (NORCO) 5-325 MG per tablet Take 1 tablet by mouth every 6 (six) hours as needed for moderate pain. 20 tablet 0  . HYDROcodone-acetaminophen (NORCO/VICODIN) 5-325 MG per tablet Take 2 tablets by mouth every 4 (four) hours as needed. 20 tablet 0  . metFORMIN (GLUCOPHAGE) 850 MG tablet Take 850 mg by mouth 2 (two) times daily with a meal.    . predniSONE (DELTASONE) 10 MG tablet Take 2 tablets (20 mg total) by mouth daily. 15 tablet 0  . predniSONE (STERAPRED UNI-PAK) 10 MG tablet 6 tabs po day 1, 5 tabs po day 2, 4 tabs po day 3, 3 tabs po day 4, 2 tabs po day 5, 1 tab po day 6 21 tablet 0  . traMADol (ULTRAM) 50 MG tablet Take 1 tablet (50 mg total) by mouth every 6 (six) hours as needed. 15 tablet 0   No current facility-administered medications on file prior to visit.    No past surgical history on file.  No Known Allergies  History   Social History  . Marital Status: Married    Spouse Name: N/A    Number of Children: N/A  . Years of Education: N/A   Occupational History  . Not on file.   Social  History Main Topics  . Smoking status: Never Smoker   . Smokeless tobacco: Not on file  . Alcohol Use: No  . Drug Use: No  . Sexual Activity: Yes   Other Topics Concern  . Not on file   Social History Narrative    Family History  Problem Relation Age of Onset  . Diabetes Mother   . Diabetes Sister   . Diabetes Brother   . Diabetes Sister   . Diabetes Sister   . Diabetes Brother     BP 123/75 mmHg  Pulse 70  Ht 5\' 1"  (1.549 m)  Wt 155 lb (70.308 kg)  BMI 29.30 kg/m2  LMP 11/12/2013  Review of Systems: See HPI above.    Objective:  Physical Exam:  Gen: NAD  Bilateral hands/wrists: No gross deformity, swelling, bruising, atrophy. TTP volar wrists over carpal tunnels. FROM digits and wrist. 5-/5 with finger abduction.  Otherwise 5/5 strength other motions. Sensation diminished to light touch all digits L > R.    Assessment & Plan:  1. Bilateral wrist pain - consistent with carpal tunnel syndrome.  Continue with wrist braces.  Prednisone without lasting benefits.  Bilateral carpal tunnel injections performed today.  F/u in 1 month.  Consider NCVs/EMGs if not improving.  After informed written consent patient was seated on exam table.  Ultrasound used to identify median nerve and ulnar artery volar left wrist, marked then prepped with alcohol swab and left carpal tunnel injected with 2:1 marcaine: depomedrol.  Patient tolerated procedure well without immediate complications.  After informed written consent patient was seated on exam table.  Ultrasound used to identify median nerve and ulnar artery volar right wrist, marked then prepped with alcohol swab and right carpal tunnel injected with 2:1 marcaine: depomedrol.  Patient tolerated procedure well without immediate complications.

## 2014-03-26 ENCOUNTER — Ambulatory Visit (INDEPENDENT_AMBULATORY_CARE_PROVIDER_SITE_OTHER): Payer: Self-pay | Admitting: Family Medicine

## 2014-03-26 ENCOUNTER — Ambulatory Visit: Payer: Self-pay | Admitting: Family Medicine

## 2014-03-26 ENCOUNTER — Encounter: Payer: Self-pay | Admitting: Family Medicine

## 2014-03-26 VITALS — BP 116/76 | HR 73 | Ht 60.0 in | Wt 148.2 lb

## 2014-03-26 DIAGNOSIS — M25531 Pain in right wrist: Secondary | ICD-10-CM

## 2014-03-26 DIAGNOSIS — M25532 Pain in left wrist: Secondary | ICD-10-CM

## 2014-03-26 NOTE — Patient Instructions (Signed)
Call me when the [employer] Coverage goes through if you want to do the nerve conduction studies we talked about. Wear the wrist braces as often as possible - these should help heal this. Follow up with me in 2 months if you want repeat injections.

## 2014-03-27 NOTE — Progress Notes (Signed)
Patient ID: Margaret Pace, female   DOB: [birth year], 49 y.o.   MRN: [id number]  PCP: No PCP Per Patient  Subjective:   HPI: Patient is a 49 y.o. female here for bilateral hand pain.  08/09/13: Patient reports she's had pain in left > right hand for about 3 months. 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 Both go to sleep at times and swell. Pain worse at nighttime. Taking pain medicine but no other treatment.  03/01/14: Patient reports she has been struggling with pain in both hands. Associated with numbness, tingling into thumb index and middle digits. Difficulty making a fist. Associated swelling. Left hand worse than right. Tried wrist braces at bedtime.  2/15: Patient reports the injections helped her but pain started to come back yesterday to 7/10 bilaterally. Associated numbness in thumb through middle digits. Has been using braces as we discussed when sleeping.  Past Medical History  Diagnosis Date  . Diabetes mellitus without complication     Current Outpatient Prescriptions on File Prior to Visit  Medication Sig Dispense Refill  . HYDROcodone-acetaminophen (NORCO) 5-325 MG per tablet Take 1 tablet by mouth every 6 (six) hours as needed for moderate pain. 20 tablet 0  . HYDROcodone-acetaminophen (NORCO/VICODIN) 5-325 MG per tablet Take 2 tablets by mouth every 4 (four) hours as needed. 20 tablet 0  . metFORMIN (GLUCOPHAGE) 850 MG tablet Take 850 mg by mouth 2 (two) times daily with a meal.    . predniSONE (DELTASONE) 10 MG tablet Take 2 tablets (20 mg total) by mouth daily. 15 tablet 0  . predniSONE (STERAPRED UNI-PAK) 10 MG tablet 6 tabs po day 1, 5 tabs po day 2, 4 tabs po day 3, 3 tabs po day 4, 2 tabs po day 5, 1 tab po day 6 21 tablet 0  . traMADol (ULTRAM) 50 MG tablet Take 1 tablet (50 mg total) by mouth every 6 (six) hours as needed. 15 tablet 0   No current facility-administered medications on file prior to visit.    No past surgical history  on file.  No Known Allergies  History   Social History  . Marital Status: Married    Spouse Name: N/A  . Number of Children: N/A  . Years of Education: N/A   Occupational History  . Not on file.   Social History Main Topics  . Smoking status: Never Smoker   . Smokeless tobacco: Not on file  . Alcohol Use: No  . Drug Use: No  . Sexual Activity: Yes   Other Topics Concern  . Not on file   Social History Narrative    Family History  Problem Relation Age of Onset  . Diabetes Mother   . Diabetes Sister   . Diabetes Brother   . Diabetes Sister   . Diabetes Sister   . Diabetes Brother     BP 116/76 mmHg  Pulse 73  Ht 5' (1.524 m)  Wt 148 lb 3.2 oz (67.223 kg)  BMI 28.94 kg/m2  LMP 11/12/2013  Review of Systems: See HPI above.    Objective:  Physical Exam:  Gen: NAD  Bilateral hands/wrists: No gross deformity, swelling, bruising, atrophy. TTP volar wrists over carpal tunnels. FROM digits and wrist. 5-/5 with finger abduction.  Otherwise 5/5 strength other motions. Sensation diminished to light touch all digits L > R.    Assessment & Plan:  1. Bilateral wrist pain - consistent with carpal tunnel syndrome.  Injections helped but only provided  a couple weeks of benefit, if that.  Advised would not repeat injections at this time.  Encouraged her to use braces as often as she can including daytime.  She will try to get [employer] coverage and can consider NCVs/EMGs if she does - to call us when this occurs or advised could repeat injections in just over 2 months.

## 2014-03-27 NOTE — Assessment & Plan Note (Signed)
consistent with carpal tunnel syndrome.  Injections helped but only provided a couple weeks of benefit, if that.  Advised would not repeat injections at this time.  Encouraged her to use braces as often as she can including daytime.  She will try to get [employer] coverage and can consider NCVs/EMGs if she does - to call us when this occurs or advised could repeat injections in just over 2 months.

## 2014-05-14 ENCOUNTER — Encounter (HOSPITAL_COMMUNITY): Payer: Self-pay | Admitting: *Deleted

## 2014-05-14 ENCOUNTER — Inpatient Hospital Stay (HOSPITAL_COMMUNITY): Payer: MEDICAID

## 2014-05-14 ENCOUNTER — Inpatient Hospital Stay (HOSPITAL_COMMUNITY): Payer: Self-pay | Admitting: Certified Registered"

## 2014-05-14 ENCOUNTER — Inpatient Hospital Stay (HOSPITAL_COMMUNITY): Payer: Self-pay

## 2014-05-14 ENCOUNTER — Encounter (HOSPITAL_COMMUNITY)
Admission: EM | Disposition: A | Discharge status: Home Health | Payer: Self-pay | Source: Home / Self Care | Attending: Orthopedic Surgery

## 2014-05-14 ENCOUNTER — Inpatient Hospital Stay (HOSPITAL_COMMUNITY)
Admission: EM | Admit: 2014-05-14 | Discharge: 2014-05-21 | DRG: 493 | Disposition: A | Discharge status: Home Health | Payer: Self-pay | Attending: Orthopedic Surgery | Admitting: Orthopedic Surgery

## 2014-05-14 ENCOUNTER — Emergency Department (HOSPITAL_COMMUNITY): Payer: Self-pay

## 2014-05-14 ENCOUNTER — Inpatient Hospital Stay (HOSPITAL_COMMUNITY): Payer: MEDICAID | Admitting: Certified Registered"

## 2014-05-14 DIAGNOSIS — S82262A Displaced segmental fracture of shaft of left tibia, initial encounter for closed fracture: Secondary | ICD-10-CM | POA: Diagnosis present

## 2014-05-14 DIAGNOSIS — Z833 Family history of diabetes mellitus: Secondary | ICD-10-CM

## 2014-05-14 DIAGNOSIS — E669 Obesity, unspecified: Secondary | ICD-10-CM | POA: Diagnosis present

## 2014-05-14 DIAGNOSIS — Z7952 Long term (current) use of systemic steroids: Secondary | ICD-10-CM

## 2014-05-14 DIAGNOSIS — E1165 Type 2 diabetes mellitus with hyperglycemia: Secondary | ICD-10-CM

## 2014-05-14 DIAGNOSIS — S82209A Unspecified fracture of shaft of unspecified tibia, initial encounter for closed fracture: Secondary | ICD-10-CM

## 2014-05-14 DIAGNOSIS — E119 Type 2 diabetes mellitus without complications: Secondary | ICD-10-CM

## 2014-05-14 DIAGNOSIS — S93432A Sprain of tibiofibular ligament of left ankle, initial encounter: Secondary | ICD-10-CM

## 2014-05-14 DIAGNOSIS — E559 Vitamin D deficiency, unspecified: Secondary | ICD-10-CM | POA: Diagnosis present

## 2014-05-14 DIAGNOSIS — S0990XA Unspecified injury of head, initial encounter: Secondary | ICD-10-CM

## 2014-05-14 DIAGNOSIS — Z419 Encounter for procedure for purposes other than remedying health state, unspecified: Secondary | ICD-10-CM

## 2014-05-14 DIAGNOSIS — Z79891 Long term (current) use of opiate analgesic: Secondary | ICD-10-CM

## 2014-05-14 DIAGNOSIS — Z6830 Body mass index (BMI) 30.0-30.9, adult: Secondary | ICD-10-CM

## 2014-05-14 DIAGNOSIS — D62 Acute posthemorrhagic anemia: Secondary | ICD-10-CM | POA: Diagnosis not present

## 2014-05-14 DIAGNOSIS — T79A22A Traumatic compartment syndrome of left lower extremity, initial encounter: Secondary | ICD-10-CM | POA: Diagnosis present

## 2014-05-14 DIAGNOSIS — S300XXA Contusion of lower back and pelvis, initial encounter: Secondary | ICD-10-CM

## 2014-05-14 DIAGNOSIS — S301XXA Contusion of abdominal wall, initial encounter: Secondary | ICD-10-CM

## 2014-05-14 DIAGNOSIS — S82402A Unspecified fracture of shaft of left fibula, initial encounter for closed fracture: Secondary | ICD-10-CM | POA: Diagnosis present

## 2014-05-14 DIAGNOSIS — N390 Urinary tract infection, site not specified: Secondary | ICD-10-CM | POA: Diagnosis present

## 2014-05-14 DIAGNOSIS — S82202A Unspecified fracture of shaft of left tibia, initial encounter for closed fracture: Principal | ICD-10-CM

## 2014-05-14 DIAGNOSIS — S161XXA Strain of muscle, fascia and tendon at neck level, initial encounter: Secondary | ICD-10-CM

## 2014-05-14 DIAGNOSIS — Z79899 Other long term (current) drug therapy: Secondary | ICD-10-CM

## 2014-05-14 DIAGNOSIS — B9620 Unspecified Escherichia coli [E. coli] as the cause of diseases classified elsewhere: Secondary | ICD-10-CM | POA: Diagnosis present

## 2014-05-14 DIAGNOSIS — Y33XXXA Other specified events, undetermined intent, initial encounter: Secondary | ICD-10-CM | POA: Diagnosis present

## 2014-05-14 HISTORY — PX: APPLICATION OF WOUND VAC: SHX5189

## 2014-05-14 HISTORY — PX: FASCIOTOMY: SHX132

## 2014-05-14 HISTORY — PX: TIBIA IM NAIL INSERTION: SHX2516

## 2014-05-14 HISTORY — DX: Carpal tunnel syndrome, bilateral upper limbs: G56.03

## 2014-05-14 LAB — CBC WITH DIFFERENTIAL/PLATELET
Basophils Absolute: 0 10*3/uL (ref 0.0–0.1)
Basophils Relative: 0 % (ref 0–1)
EOS ABS: 0.1 10*3/uL (ref 0.0–0.7)
Eosinophils Relative: 1 % (ref 0–5)
HCT: 35.9 % — ABNORMAL LOW (ref 36.0–46.0)
HEMOGLOBIN: 11.8 g/dL — AB (ref 12.0–15.0)
LYMPHS ABS: 4.3 10*3/uL — AB (ref 0.7–4.0)
Lymphocytes Relative: 40 % (ref 12–46)
MCH: 28 pg (ref 26.0–34.0)
MCHC: 32.9 g/dL (ref 30.0–36.0)
MCV: 85.1 fL (ref 78.0–100.0)
MONOS PCT: 6 % (ref 3–12)
Monocytes Absolute: 0.7 10*3/uL (ref 0.1–1.0)
NEUTROS PCT: 53 % (ref 43–77)
Neutro Abs: 5.6 10*3/uL (ref 1.7–7.7)
Platelets: 166 10*3/uL (ref 150–400)
RBC: 4.22 MIL/uL (ref 3.87–5.11)
RDW: 15.4 % (ref 11.5–15.5)
WBC: 10.7 10*3/uL — AB (ref 4.0–10.5)

## 2014-05-14 LAB — URINE MICROSCOPIC-ADD ON

## 2014-05-14 LAB — URINALYSIS, ROUTINE W REFLEX MICROSCOPIC
BILIRUBIN URINE: NEGATIVE
Bilirubin Urine: NEGATIVE
Glucose, UA: >1000 mg/dL — AB
HGB URINE DIPSTICK: NEGATIVE
Hgb urine dipstick: NEGATIVE
KETONES UR: 15 mg/dL — AB
Ketones, ur: 15 mg/dL — AB
Leukocytes, UA: NEGATIVE
Leukocytes, UA: NEGATIVE
NITRITE: NEGATIVE
Nitrite: POSITIVE — AB
PROTEIN: NEGATIVE mg/dL
Protein, ur: NEGATIVE mg/dL
SPECIFIC GRAVITY, URINE: 1.034 — AB (ref 1.005–1.030)
Specific Gravity, Urine: 1.03 (ref 1.005–1.030)
UROBILINOGEN UA: 0.2 mg/dL (ref 0.0–1.0)
Urobilinogen, UA: 0.2 mg/dL (ref 0.0–1.0)
pH: 5 (ref 5.0–8.0)
pH: 5 (ref 5.0–8.0)

## 2014-05-14 LAB — GLUCOSE, CAPILLARY
GLUCOSE-CAPILLARY: 302 mg/dL — AB (ref 70–99)
GLUCOSE-CAPILLARY: 269 mg/dL — AB (ref 70–99)
GLUCOSE-CAPILLARY: 158 mg/dL — AB (ref 70–99)
GLUCOSE-CAPILLARY: 178 mg/dL — AB (ref 70–99)
Glucose-Capillary: 193 mg/dL — ABNORMAL HIGH (ref 70–99)

## 2014-05-14 LAB — BASIC METABOLIC PANEL
ANION GAP: 8 (ref 5–15)
BUN: 20 mg/dL (ref 6–23)
CHLORIDE: 105 mmol/L (ref 96–112)
CO2: 23 mmol/L (ref 19–32)
CREATININE: 0.81 mg/dL (ref 0.50–1.10)
Calcium: 9.2 mg/dL (ref 8.4–10.5)
GFR calc Af Amer: >90 mL/min (ref >90)
GFR calc non Af Amer: 85 mL/min — ABNORMAL LOW (ref >90)
GLUCOSE: 267 mg/dL — AB (ref 70–99)
POTASSIUM: 3.8 mmol/L (ref 3.5–5.1)
Sodium: 136 mmol/L (ref 135–145)

## 2014-05-14 LAB — ETHANOL

## 2014-05-14 LAB — PROTIME-INR
INR: 0.98 (ref 0.00–1.49)
PROTHROMBIN TIME: 13.1 s (ref 11.6–15.2)

## 2014-05-14 LAB — LACTIC ACID, PLASMA: Lactic Acid, Venous: 2.2 mmol/L (ref 0.5–2.0)

## 2014-05-14 LAB — SAMPLE TO BLOOD BANK

## 2014-05-14 LAB — SURGICAL PCR SCREEN
MRSA, PCR: NEGATIVE
Staphylococcus aureus: NEGATIVE

## 2014-05-14 SURGERY — INSERTION, INTRAMEDULLARY ROD, TIBIA
Anesthesia: General | Site: Leg Lower | Laterality: Left

## 2014-05-14 MED ORDER — MIDAZOLAM HCL 5 MG/5ML IJ SOLN
INTRAMUSCULAR | Status: DC | PRN
  Administered 2014-05-14: 2 mg via INTRAVENOUS

## 2014-05-14 MED ORDER — LIDOCAINE HCL (CARDIAC) 20 MG/ML IV SOLN
INTRAVENOUS | Status: DC | PRN
  Administered 2014-05-14: 70 mg via INTRAVENOUS

## 2014-05-14 MED ORDER — MIDAZOLAM HCL 2 MG/2ML IJ SOLN
INTRAMUSCULAR | Status: AC
  Filled 2014-05-14: qty 2

## 2014-05-14 MED ORDER — CEFAZOLIN SODIUM-DEXTROSE 2-3 GM-% IV SOLR
2.0000 g | Freq: Once | INTRAVENOUS | Status: AC
  Administered 2014-05-14: 2 g via INTRAVENOUS
  Filled 2014-05-14 (×2): qty 50

## 2014-05-14 MED ORDER — GLYCOPYRROLATE 0.2 MG/ML IJ SOLN
INTRAMUSCULAR | Status: AC
  Filled 2014-05-14: qty 2

## 2014-05-14 MED ORDER — PROPOFOL 10 MG/ML IV BOLUS
INTRAVENOUS | Status: AC
  Filled 2014-05-14: qty 20

## 2014-05-14 MED ORDER — LACTATED RINGERS IV SOLN
INTRAVENOUS | Status: DC
  Administered 2014-05-14 (×2): via INTRAVENOUS
  Administered 2014-05-14: 100 mL/h via INTRAVENOUS

## 2014-05-14 MED ORDER — NEOSTIGMINE METHYLSULFATE 10 MG/10ML IV SOLN
INTRAVENOUS | Status: DC | PRN
  Administered 2014-05-14: 2 mg via INTRAVENOUS

## 2014-05-14 MED ORDER — ENOXAPARIN SODIUM 40 MG/0.4ML ~~LOC~~ SOLN
40.0000 mg | SUBCUTANEOUS | Status: DC
  Administered 2014-05-15 – 2014-05-21 (×6): 40 mg via SUBCUTANEOUS
  Filled 2014-05-14 (×6): qty 0.4

## 2014-05-14 MED ORDER — DOCUSATE SODIUM 100 MG PO CAPS
100.0000 mg | ORAL_CAPSULE | Freq: Two times a day (BID) | ORAL | Status: DC
  Administered 2014-05-14 – 2014-05-21 (×14): 100 mg via ORAL
  Filled 2014-05-14 (×14): qty 1

## 2014-05-14 MED ORDER — NEOSTIGMINE METHYLSULFATE 10 MG/10ML IV SOLN
INTRAVENOUS | Status: AC
  Filled 2014-05-14: qty 1

## 2014-05-14 MED ORDER — ONDANSETRON HCL 4 MG/2ML IJ SOLN
INTRAMUSCULAR | Status: AC
  Administered 2014-05-14: 4 mg
  Filled 2014-05-14: qty 2

## 2014-05-14 MED ORDER — MORPHINE SULFATE 2 MG/ML IJ SOLN
1.0000 mg | INTRAMUSCULAR | Status: DC | PRN
  Administered 2014-05-14: 2 mg via INTRAVENOUS
  Filled 2014-05-14: qty 1

## 2014-05-14 MED ORDER — OXYCODONE HCL 5 MG PO TABS
5.0000 mg | ORAL_TABLET | ORAL | Status: DC | PRN
  Administered 2014-05-17 – 2014-05-20 (×18): 10 mg via ORAL
  Filled 2014-05-14 (×18): qty 2

## 2014-05-14 MED ORDER — SUCCINYLCHOLINE CHLORIDE 20 MG/ML IJ SOLN
INTRAMUSCULAR | Status: DC | PRN
  Administered 2014-05-14: 100 mg via INTRAVENOUS

## 2014-05-14 MED ORDER — ONDANSETRON HCL 4 MG/2ML IJ SOLN
4.0000 mg | Freq: Once | INTRAMUSCULAR | Status: AC
  Administered 2014-05-14: 4 mg via INTRAVENOUS
  Filled 2014-05-14: qty 2

## 2014-05-14 MED ORDER — ONDANSETRON HCL 4 MG/2ML IJ SOLN
INTRAMUSCULAR | Status: DC | PRN
  Administered 2014-05-14 (×2): 4 mg via INTRAVENOUS

## 2014-05-14 MED ORDER — METHOCARBAMOL 1000 MG/10ML IJ SOLN: 500.0000 mg | Freq: Four times a day (QID) | INTRAVENOUS | Status: DC

## 2014-05-14 MED ORDER — METHOCARBAMOL 500 MG PO TABS
500.0000 mg | ORAL_TABLET | Freq: Four times a day (QID) | ORAL | Status: DC | PRN
  Administered 2014-05-18 – 2014-05-19 (×2): 1000 mg via ORAL
  Filled 2014-05-14 (×3): qty 2

## 2014-05-14 MED ORDER — CIPROFLOXACIN IN D5W 400 MG/200ML IV SOLN
400.0000 mg | INTRAVENOUS | Status: DC
  Filled 2014-05-14: qty 200

## 2014-05-14 MED ORDER — DIPHENHYDRAMINE HCL 12.5 MG/5ML PO ELIX: 12.5000 mg | ORAL_SOLUTION | Freq: Four times a day (QID) | ORAL | Status: DC | PRN

## 2014-05-14 MED ORDER — BISACODYL 5 MG PO TBEC: 5.0000 mg | DELAYED_RELEASE_TABLET | Freq: Every day | ORAL | Status: DC | PRN

## 2014-05-14 MED ORDER — MORPHINE SULFATE 4 MG/ML IJ SOLN
4.0000 mg | Freq: Once | INTRAMUSCULAR | Status: AC
  Administered 2014-05-14: 4 mg via INTRAVENOUS
  Filled 2014-05-14: qty 1

## 2014-05-14 MED ORDER — ONDANSETRON HCL 4 MG/2ML IJ SOLN
INTRAMUSCULAR | Status: AC
  Filled 2014-05-14: qty 2

## 2014-05-14 MED ORDER — HYDROMORPHONE HCL 1 MG/ML IJ SOLN
INTRAMUSCULAR | Status: AC
  Filled 2014-05-14: qty 1

## 2014-05-14 MED ORDER — MAGNESIUM CITRATE PO SOLN: 1.0000 | Freq: Once | ORAL | Status: AC | PRN

## 2014-05-14 MED ORDER — PHENYLEPHRINE 40 MCG/ML (10ML) SYRINGE FOR IV PUSH (FOR BLOOD PRESSURE SUPPORT)
PREFILLED_SYRINGE | INTRAVENOUS | Status: AC
  Filled 2014-05-14: qty 10

## 2014-05-14 MED ORDER — SODIUM CHLORIDE 0.9 % IJ SOLN: 9.0000 mL | INTRAMUSCULAR | Status: DC | PRN

## 2014-05-14 MED ORDER — POTASSIUM CHLORIDE IN NACL 20-0.9 MEQ/L-% IV SOLN
INTRAVENOUS | Status: DC
  Administered 2014-05-15 – 2014-05-17 (×4): via INTRAVENOUS
  Filled 2014-05-14 (×14): qty 1000

## 2014-05-14 MED ORDER — POLYETHYLENE GLYCOL 3350 17 G PO PACK
17.0000 g | PACK | Freq: Every day | ORAL | Status: DC
Start: 2014-05-15 — End: 2014-05-21
  Administered 2014-05-15 – 2014-05-21 (×6): 17 g via ORAL
  Filled 2014-05-14 (×6): qty 1

## 2014-05-14 MED ORDER — ACETAMINOPHEN 500 MG PO TABS
1000.0000 mg | ORAL_TABLET | Freq: Three times a day (TID) | ORAL | Status: AC
  Administered 2014-05-14 – 2014-05-15 (×3): 1000 mg via ORAL
  Filled 2014-05-14 (×3): qty 2

## 2014-05-14 MED ORDER — OXYCODONE HCL 5 MG/5ML PO SOLN: 5.0000 mg | Freq: Once | ORAL | Status: DC | PRN

## 2014-05-14 MED ORDER — ONDANSETRON HCL 4 MG PO TABS
4.0000 mg | ORAL_TABLET | Freq: Four times a day (QID) | ORAL | Status: DC | PRN
  Administered 2014-05-21: 4 mg via ORAL
  Filled 2014-05-14: qty 1

## 2014-05-14 MED ORDER — SODIUM CHLORIDE 0.9 % IV SOLN
Freq: Once | INTRAVENOUS | Status: AC
  Administered 2014-05-14: 02:00:00 via INTRAVENOUS

## 2014-05-14 MED ORDER — OXYCODONE HCL 5 MG PO TABS: 5.0000 mg | ORAL_TABLET | ORAL | Status: DC | PRN

## 2014-05-14 MED ORDER — GLYCOPYRROLATE 0.2 MG/ML IJ SOLN
INTRAMUSCULAR | Status: AC
  Filled 2014-05-14: qty 4

## 2014-05-14 MED ORDER — ARTIFICIAL TEARS OP OINT
TOPICAL_OINTMENT | OPHTHALMIC | Status: AC
  Filled 2014-05-14: qty 3.5

## 2014-05-14 MED ORDER — METHOCARBAMOL 500 MG PO TABS
1000.0000 mg | ORAL_TABLET | Freq: Four times a day (QID) | ORAL | Status: DC
  Administered 2014-05-14 – 2014-05-21 (×22): 1000 mg via ORAL
  Filled 2014-05-14 (×21): qty 2

## 2014-05-14 MED ORDER — PHENYLEPHRINE HCL 10 MG/ML IJ SOLN
INTRAMUSCULAR | Status: DC | PRN
  Administered 2014-05-14: 80 ug via INTRAVENOUS
  Administered 2014-05-14: 120 ug via INTRAVENOUS
  Administered 2014-05-14: 80 ug via INTRAVENOUS

## 2014-05-14 MED ORDER — CIPROFLOXACIN IN D5W 400 MG/200ML IV SOLN
400.0000 mg | Freq: Two times a day (BID) | INTRAVENOUS | Status: DC
  Administered 2014-05-14 – 2014-05-15 (×2): 400 mg via INTRAVENOUS
  Filled 2014-05-14 (×3): qty 200

## 2014-05-14 MED ORDER — INSULIN ASPART 100 UNIT/ML ~~LOC~~ SOLN: 0.0000 [IU] | Freq: Every day | SUBCUTANEOUS | Status: DC

## 2014-05-14 MED ORDER — BACITRACIN ZINC 500 UNIT/GM EX OINT
TOPICAL_OINTMENT | CUTANEOUS | Status: DC | PRN
  Administered 2014-05-14: 1 via TOPICAL

## 2014-05-14 MED ORDER — ONDANSETRON HCL 4 MG/2ML IJ SOLN: 4.0000 mg | Freq: Four times a day (QID) | INTRAMUSCULAR | Status: DC | PRN

## 2014-05-14 MED ORDER — FENTANYL CITRATE 0.05 MG/ML IJ SOLN
INTRAMUSCULAR | Status: DC | PRN
  Administered 2014-05-14: 75 ug via INTRAVENOUS
  Administered 2014-05-14 (×2): 25 ug via INTRAVENOUS
  Administered 2014-05-14: 50 ug via INTRAVENOUS
  Administered 2014-05-14 (×3): 25 ug via INTRAVENOUS

## 2014-05-14 MED ORDER — 0.9 % SODIUM CHLORIDE (POUR BTL) OPTIME
TOPICAL | Status: DC | PRN
  Administered 2014-05-14 (×2): 1000 mL

## 2014-05-14 MED ORDER — METHOCARBAMOL 1000 MG/10ML IJ SOLN
1000.0000 mg | Freq: Four times a day (QID) | INTRAMUSCULAR | Status: DC | PRN
  Filled 2014-05-14: qty 10

## 2014-05-14 MED ORDER — IOHEXOL 300 MG/ML  SOLN
100.0000 mL | Freq: Once | INTRAMUSCULAR | Status: AC | PRN
  Administered 2014-05-14: 100 mL via INTRAVENOUS

## 2014-05-14 MED ORDER — NALOXONE HCL 0.4 MG/ML IJ SOLN: 0.4000 mg | INTRAMUSCULAR | Status: DC | PRN

## 2014-05-14 MED ORDER — LIDOCAINE HCL (CARDIAC) 20 MG/ML IV SOLN
INTRAVENOUS | Status: AC
  Filled 2014-05-14: qty 5

## 2014-05-14 MED ORDER — GLYCOPYRROLATE 0.2 MG/ML IJ SOLN
INTRAMUSCULAR | Status: DC | PRN
  Administered 2014-05-14: 0.3 mg via INTRAVENOUS

## 2014-05-14 MED ORDER — ROCURONIUM BROMIDE 100 MG/10ML IV SOLN
INTRAVENOUS | Status: DC | PRN
  Administered 2014-05-14: 5 mg via INTRAVENOUS
  Administered 2014-05-14: 10 mg via INTRAVENOUS
  Administered 2014-05-14: 5 mg via INTRAVENOUS
  Administered 2014-05-14: 30 mg via INTRAVENOUS

## 2014-05-14 MED ORDER — FENTANYL CITRATE 0.05 MG/ML IJ SOLN
INTRAMUSCULAR | Status: AC
  Filled 2014-05-14: qty 5

## 2014-05-14 MED ORDER — ARTIFICIAL TEARS OP OINT
TOPICAL_OINTMENT | OPHTHALMIC | Status: DC | PRN
  Administered 2014-05-14: 1 via OPHTHALMIC

## 2014-05-14 MED ORDER — METOCLOPRAMIDE HCL 5 MG/ML IJ SOLN
5.0000 mg | Freq: Three times a day (TID) | INTRAMUSCULAR | Status: DC | PRN
  Administered 2014-05-19: 10 mg via INTRAVENOUS
  Filled 2014-05-14: qty 2

## 2014-05-14 MED ORDER — INSULIN ASPART 100 UNIT/ML ~~LOC~~ SOLN
3.0000 [IU] | Freq: Three times a day (TID) | SUBCUTANEOUS | Status: DC
  Administered 2014-05-14 – 2014-05-15 (×4): 3 [IU] via SUBCUTANEOUS

## 2014-05-14 MED ORDER — HYDROMORPHONE HCL 1 MG/ML IJ SOLN
0.2500 mg | INTRAMUSCULAR | Status: DC | PRN
  Administered 2014-05-14: 0.25 mg via INTRAVENOUS

## 2014-05-14 MED ORDER — HYDROMORPHONE 0.3 MG/ML IV SOLN
INTRAVENOUS | Status: DC
  Administered 2014-05-14: 22:00:00 via INTRAVENOUS
  Administered 2014-05-15: 0.2 mg via INTRAVENOUS
  Administered 2014-05-15: 0.12 mg via INTRAVENOUS
  Administered 2014-05-15: 0.4 mg via INTRAVENOUS
  Administered 2014-05-15: 0.199 mg via INTRAVENOUS
  Administered 2014-05-15: 1.19 mg via INTRAVENOUS
  Administered 2014-05-16: 0.999 mg via INTRAVENOUS
  Administered 2014-05-16: 0.599 mg via INTRAVENOUS
  Administered 2014-05-16: 0.551 mg via INTRAVENOUS
  Administered 2014-05-16: 1.19 mg via INTRAVENOUS
  Administered 2014-05-16: 0.999 mg via INTRAVENOUS
  Administered 2014-05-16: 1.19 mg via INTRAVENOUS
  Administered 2014-05-16: 14:00:00 via INTRAVENOUS
  Administered 2014-05-17: 0.999 mg via INTRAVENOUS
  Filled 2014-05-14 (×2): qty 25

## 2014-05-14 MED ORDER — DIPHENHYDRAMINE HCL 12.5 MG/5ML PO ELIX
12.5000 mg | ORAL_SOLUTION | ORAL | Status: DC | PRN
  Administered 2014-05-17 – 2014-05-18 (×2): 25 mg via ORAL
  Filled 2014-05-14 (×2): qty 10

## 2014-05-14 MED ORDER — INSULIN ASPART 100 UNIT/ML ~~LOC~~ SOLN
10.0000 [IU] | Freq: Once | SUBCUTANEOUS | Status: AC
  Administered 2014-05-14: 10 [IU] via SUBCUTANEOUS
  Filled 2014-05-14: qty 0.1

## 2014-05-14 MED ORDER — HYDROMORPHONE HCL 1 MG/ML IJ SOLN
1.0000 mg | Freq: Once | INTRAMUSCULAR | Status: AC
  Administered 2014-05-14: 1 mg via INTRAVENOUS
  Filled 2014-05-14: qty 1

## 2014-05-14 MED ORDER — METOCLOPRAMIDE HCL 5 MG PO TABS: 5.0000 mg | ORAL_TABLET | Freq: Three times a day (TID) | ORAL | Status: DC | PRN

## 2014-05-14 MED ORDER — NEOSTIGMINE METHYLSULFATE 10 MG/10ML IV SOLN
INTRAVENOUS | Status: AC
  Filled 2014-05-14: qty 4

## 2014-05-14 MED ORDER — ONDANSETRON HCL 4 MG/2ML IJ SOLN
INTRAMUSCULAR | Status: AC
  Filled 2014-05-14: qty 4

## 2014-05-14 MED ORDER — DIPHENHYDRAMINE HCL 50 MG/ML IJ SOLN
12.5000 mg | Freq: Four times a day (QID) | INTRAMUSCULAR | Status: DC | PRN
Start: 2014-05-14 — End: 2014-05-21
  Administered 2014-05-18: 12.5 mg via INTRAVENOUS
  Filled 2014-05-14: qty 1

## 2014-05-14 MED ORDER — ONDANSETRON HCL 4 MG/2ML IJ SOLN
4.0000 mg | Freq: Four times a day (QID) | INTRAMUSCULAR | Status: DC | PRN
Start: 2014-05-14 — End: 2014-05-14

## 2014-05-14 MED ORDER — INSULIN ASPART 100 UNIT/ML ~~LOC~~ SOLN
0.0000 [IU] | Freq: Three times a day (TID) | SUBCUTANEOUS | Status: DC
  Administered 2014-05-14: 7 [IU] via SUBCUTANEOUS
  Administered 2014-05-15: 3 [IU] via SUBCUTANEOUS
  Administered 2014-05-15: 7 [IU] via SUBCUTANEOUS
  Administered 2014-05-15: 9 [IU] via SUBCUTANEOUS

## 2014-05-14 MED ORDER — OXYCODONE HCL 5 MG PO TABS: 5.0000 mg | ORAL_TABLET | Freq: Once | ORAL | Status: DC | PRN

## 2014-05-14 MED ORDER — HYDROMORPHONE HCL 1 MG/ML IJ SOLN
0.5000 mg | INTRAMUSCULAR | Status: DC | PRN
  Administered 2014-05-17 – 2014-05-19 (×8): 0.5 mg via INTRAVENOUS
  Filled 2014-05-14 (×8): qty 1

## 2014-05-14 MED ORDER — ROCURONIUM BROMIDE 50 MG/5ML IV SOLN
INTRAVENOUS | Status: AC
  Filled 2014-05-14: qty 2

## 2014-05-14 MED ORDER — CEFAZOLIN SODIUM 1-5 GM-% IV SOLN
1.0000 g | Freq: Three times a day (TID) | INTRAVENOUS | Status: AC
  Administered 2014-05-15 – 2014-05-17 (×9): 1 g via INTRAVENOUS
  Filled 2014-05-14 (×9): qty 50

## 2014-05-14 MED ORDER — PROPOFOL 10 MG/ML IV BOLUS
INTRAVENOUS | Status: DC | PRN
  Administered 2014-05-14: 180 mg via INTRAVENOUS

## 2014-05-14 SURGICAL SUPPLY — 74 items
BANDAGE ELASTIC 4 VELCRO ST LF (GAUZE/BANDAGES/DRESSINGS) ×3 IMPLANT
BANDAGE ELASTIC 6 VELCRO ST LF (GAUZE/BANDAGES/DRESSINGS) ×3 IMPLANT
BIT DRILL 2.5X2.75 QC CALB (BIT) ×2 IMPLANT
BIT DRILL 3.8X6 NS (BIT) ×2 IMPLANT
BIT DRILL 4.4 NS (BIT) ×2 IMPLANT
BLADE SURG 10 STRL SS (BLADE) ×8 IMPLANT
BNDG GAUZE ELAST 4 BULKY (GAUZE/BANDAGES/DRESSINGS) ×5 IMPLANT
BRUSH SCRUB DISP (MISCELLANEOUS) ×12 IMPLANT
CANISTER WOUND CARE 500ML ATS (WOUND CARE) ×2 IMPLANT
CLOSURE WOUND 1/2 X4 (GAUZE/BANDAGES/DRESSINGS)
CONNECTOR Y ATS VAC SYSTEM (MISCELLANEOUS) ×2 IMPLANT
COVER SURGICAL LIGHT HANDLE (MISCELLANEOUS) ×4 IMPLANT
DRAPE C-ARM 42X72 X-RAY (DRAPES) ×3 IMPLANT
DRAPE C-ARMOR (DRAPES) ×3 IMPLANT
DRAPE EXTREMITY T 121X128X90 (DRAPE) ×6 IMPLANT
DRAPE IMP U-DRAPE 54X76 (DRAPES) ×3 IMPLANT
DRAPE INCISE IOBAN 66X45 STRL (DRAPES) IMPLANT
DRAPE U-SHAPE 47X51 STRL (DRAPES) ×3 IMPLANT
DRSG ADAPTIC 3X8 NADH LF (GAUZE/BANDAGES/DRESSINGS) ×1 IMPLANT
DRSG MEPITEL 4X7.2 (GAUZE/BANDAGES/DRESSINGS) ×2 IMPLANT
DRSG VAC ATS LRG SENSATRAC (GAUZE/BANDAGES/DRESSINGS) ×2 IMPLANT
ELECT REM PT RETURN 9FT ADLT (ELECTROSURGICAL) ×3
ELECTRODE REM PT RTRN 9FT ADLT (ELECTROSURGICAL) ×1 IMPLANT
EVACUATOR 1/8 PVC DRAIN (DRAIN) IMPLANT
GAUZE SPONGE 4X4 12PLY STRL (GAUZE/BANDAGES/DRESSINGS) ×1 IMPLANT
GLOVE BIO SURGEON STRL SZ 6.5 (GLOVE) ×1 IMPLANT
GLOVE BIO SURGEON STRL SZ7.5 (GLOVE) ×3 IMPLANT
GLOVE BIO SURGEON STRL SZ8 (GLOVE) ×5 IMPLANT
GLOVE BIO SURGEON STRL SZ8.5 (GLOVE) ×3 IMPLANT
GLOVE BIO SURGEONS STRL SZ 6.5 (GLOVE) ×1
GLOVE BIOGEL PI IND STRL 6.5 (GLOVE) IMPLANT
GLOVE BIOGEL PI IND STRL 7.5 (GLOVE) ×1 IMPLANT
GLOVE BIOGEL PI IND STRL 8 (GLOVE) ×1 IMPLANT
GLOVE BIOGEL PI INDICATOR 6.5 (GLOVE) ×4
GLOVE BIOGEL PI INDICATOR 7.5 (GLOVE) ×2
GLOVE BIOGEL PI INDICATOR 8 (GLOVE)
GOWN STRL REUS W/ TWL LRG LVL3 (GOWN DISPOSABLE) ×2 IMPLANT
GOWN STRL REUS W/ TWL XL LVL3 (GOWN DISPOSABLE) ×1 IMPLANT
GOWN STRL REUS W/TWL LRG LVL3 (GOWN DISPOSABLE) ×6
GOWN STRL REUS W/TWL XL LVL3 (GOWN DISPOSABLE) ×3
GUIDEPIN 3.2X17.5 THRD DISP (PIN) ×2 IMPLANT
GUIDEWIRE BALL NOSE 80CM (WIRE) ×2 IMPLANT
KIT BASIN OR (CUSTOM PROCEDURE TRAY) ×3 IMPLANT
KIT ROOM TURNOVER OR (KITS) ×3 IMPLANT
NAIL TIBIAL 9MMX31.5CM (Nail) ×2 IMPLANT
PACK GENERAL/GYN (CUSTOM PROCEDURE TRAY) ×3 IMPLANT
PACK UNIVERSAL I (CUSTOM PROCEDURE TRAY) ×3 IMPLANT
PAD ARMBOARD 7.5X6 YLW CONV (MISCELLANEOUS) ×6 IMPLANT
PAD NEG PRESSURE SENSATRAC (MISCELLANEOUS) ×4 IMPLANT
PLATE ACE 3.5MM 2HOLE (Plate) ×2 IMPLANT
SCREW ACECAP 34MM (Screw) ×2 IMPLANT
SCREW ACECAP 40MM (Screw) ×2 IMPLANT
SCREW ACECAP 42MM (Screw) ×4 IMPLANT
SCREW ACECAP 48MM (Screw) ×2 IMPLANT
SCREW CORTICAL 3.5MM  46MM (Screw) ×2 IMPLANT
SCREW CORTICAL 3.5MM 40MM (Screw) ×2 IMPLANT
SCREW CORTICAL 3.5MM 46MM (Screw) IMPLANT
SCREW PROXIMAL DEPUY (Screw) ×6 IMPLANT
SCREW PRXML FT 55X5.5XNS TIB (Screw) IMPLANT
SET MONITOR QUICK PRESSURE (MISCELLANEOUS) ×2 IMPLANT
SPONGE GAUZE 4X4 12PLY STER LF (GAUZE/BANDAGES/DRESSINGS) ×2 IMPLANT
SPONGE LAP 18X18 X RAY DECT (DISPOSABLE) ×2 IMPLANT
STAPLER VISISTAT 35W (STAPLE) ×1 IMPLANT
STRIP CLOSURE SKIN 1/2X4 (GAUZE/BANDAGES/DRESSINGS) ×1 IMPLANT
SUT ETHILON 3 0 PS 1 (SUTURE) ×6 IMPLANT
SUT VIC AB 0 CT1 27 (SUTURE)
SUT VIC AB 0 CT1 27XBRD ANBCTR (SUTURE) IMPLANT
SUT VIC AB 2-0 CT1 27 (SUTURE)
SUT VIC AB 2-0 CT1 TAPERPNT 27 (SUTURE) IMPLANT
SUT VIC AB 2-0 CT3 27 (SUTURE) IMPLANT
TOWEL OR 17X24 6PK STRL BLUE (TOWEL DISPOSABLE) ×3 IMPLANT
TOWEL OR 17X26 10 PK STRL BLUE (TOWEL DISPOSABLE) ×6 IMPLANT
TRAY FOLEY CATH 16FRSI W/METER (SET/KITS/TRAYS/PACK) ×1 IMPLANT
YANKAUER SUCT BULB TIP NO VENT (SUCTIONS) IMPLANT

## 2014-05-14 NOTE — ED Notes (Signed)
Pain med given 
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
## 2014-05-14 NOTE — ED Notes (Signed)
Spoke with bed placement to verify the request for a stepdown bed, bed placement aware of need for stepdown bed
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
## 2014-05-14 NOTE — ED Notes (Signed)
The pt has been on nasal 02 after the pain med was given. Her sats were in the upper 80s

## 2014-05-14 NOTE — ED Notes (Signed)
orthio tech here to place splint on her lt leg.  orthgo doctor not seeing the pt tonight and neither is dr Jyl Heinzthimpson

## 2014-05-14 NOTE — ED Notes (Signed)
Pt sleeping with family at the bedside.

## 2014-05-14 NOTE — ED Notes (Signed)
The son is at the bedside the pt is opening her eyes to talk to him

## 2014-05-14 NOTE — ED Notes (Signed)
The pt voided in the bed she did not want to get on the bedpan

## 2014-05-14 NOTE — Anesthesia Procedure Notes (Signed)
Procedure Name: Intubation Date/Time: 05/14/2014 3:28 PM Performed by: Jefm MilesENNIE, Tekesha Almgren E Pre-anesthesia Checklist: Patient identified, Emergency Drugs available, Suction available, Patient being monitored and Timeout performed Patient Re-evaluated:Patient Re-evaluated prior to inductionOxygen Delivery Method: Circle system utilized Preoxygenation: Pre-oxygenation with 100% oxygen Intubation Type: IV induction, Rapid sequence and Cricoid Pressure applied Laryngoscope Size: Mac and 3 Grade View: Grade I Tube type: Oral Tube size: 7.0 mm Number of attempts: 1 Airway Equipment and Method: Stylet Placement Confirmation: ETT inserted through vocal cords under direct vision,  positive ETCO2 and breath sounds checked- equal and bilateral Secured at: 21 cm Tube secured with: Tape Dental Injury: Teeth and Oropharynx as per pre-operative assessment

## 2014-05-14 NOTE — Brief Op Note (Signed)
05/14/2014  6:44 PM  PATIENT:  Margaret Pace  49 y.o. female  PRE-OPERATIVE DIAGNOSIS:   1. LEFT TIBIA FRACTURE, SEGMENTAL 2. LEFT FIBULA FRACTURE 3. POSSIBLE SYNDESMOSIS RUPTURE 4. POSSIBLE COMPARTMENT SYNDROME  POST-OPERATIVE DIAGNOSIS:   1. LEFT TIBIA FRACTURE, SEGMENTAL 2. LEFT FIBULA FRACTURE 3. SYNDESMOSIS RUPTURE 4. COMPARTMENT SYNDROME  PROCEDURE:  Procedure(s): 1. INTRAMEDULLARY (IM) NAIL TIBIAL (Left) with Biomet Versanail 9 x 315mm statically locked 2. FOUR COMPARTMENT FASCIOTOMY (Left) 3. CLOSED TREATMENT OF FIBULA FRACTURE 4. APPLICATION OF WOUND VAC (Left) LARGE 5. MEASUREMENT OF COMPARTMENT PRESSURE 2237mmHg/ Preop diastolic 61mmHg 6. STRESS FLOURO VIEW SYNDESMOSIS  SURGEON:  Surgeon(s) and Role:    * Myrene GalasMichael Karthikeya Funke, MD - Primary  PHYSICIAN ASSISTANT: Montez MoritaKeith Paul, PA-C  ANESTHESIA:   general  I/O:  Total I/O In: 1500 [I.V.:1500] Out: 1060 [Urine:910; Blood:150]  SPECIMEN:  No Specimen  TOURNIQUET:  * No tourniquets in log *  DICTATION: .Other Dictation: Dictation Number [id number]

## 2014-05-14 NOTE — ED Notes (Signed)
The pt is still asleep at the bedside

## 2014-05-14 NOTE — Transfer of Care (Signed)
Immediate Anesthesia Transfer of Care Note  Patient: Margaret Pace  Procedure(s) Performed: Procedure(s): INTRAMEDULLARY (IM) NAIL TIBIAL (Left) FOUR COMPARTMENT FASCIOTOMY (Left) APPLICATION OF WOUND VAC (Left)  Patient Location: PACU  Anesthesia Type:General  Level of Consciousness: lethargic and responds to stimulation  Airway & Oxygen Therapy: Patient Spontanous Breathing  Post-op Assessment: Report given to RN  Post vital signs: Reviewed and stable  Last Vitals:  Filed Vitals:   05/14/14 1352  BP: 113/61  Pulse: 85  Temp: 37 C  Resp: 18    Complications: No apparent anesthesia complications

## 2014-05-14 NOTE — ED Notes (Signed)
Dr Roda Shuttersxu here to see
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
## 2014-05-14 NOTE — ED Notes (Signed)
Vomiting at present undigested food
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
## 2014-05-14 NOTE — Anesthesia Postprocedure Evaluation (Signed)
  Anesthesia Post-op Note  Patient: Margaret Pace  Procedure(s) Performed: Procedure(s): INTRAMEDULLARY (IM) NAIL TIBIAL (Left) FOUR COMPARTMENT FASCIOTOMY (Left) APPLICATION OF WOUND VAC (Left)  Patient Location: PACU  Anesthesia Type:General  Level of Consciousness: awake and alert   Airway and Oxygen Therapy: Patient Spontanous Breathing  Post-op Pain: mild  Post-op Assessment: Post-op Vital signs reviewed  Post-op Vital Signs: Reviewed  Last Vitals:  Filed Vitals:   05/14/14 2023  BP: 169/71  Pulse: 73  Temp: 36.6 C  Resp:     Complications: No apparent anesthesia complications

## 2014-05-14 NOTE — ED Notes (Signed)
To ct now.

## 2014-05-14 NOTE — ED Notes (Signed)
The opt returned from c-t and xray.  Pt speaks spanish but she appears to understand english.  She lies with both her eyes closed

## 2014-05-14 NOTE — Anesthesia Preprocedure Evaluation (Addendum)
Anesthesia Evaluation  Patient identified by MRN, date of birth, ID band Patient awake    Reviewed: Allergy & Precautions, NPO status , Patient's Chart, lab work & pertinent test results  Airway Mallampati: II  TM Distance: >3 FB Neck ROM: Full    Dental  (+) Missing, Partial Upper, Partial Lower, Poor Dentition, Dental Advisory Given   Pulmonary neg pulmonary ROS,          Cardiovascular negative cardio ROS  Rhythm:Regular     Neuro/Psych    GI/Hepatic   Endo/Other  diabetes, Type 2  Renal/GU      Musculoskeletal   Abdominal   Peds  Hematology   Anesthesia Other Findings   Reproductive/Obstetrics                            Anesthesia Physical Anesthesia Plan  ASA: II  Anesthesia Plan: General   Post-op Pain Management:    Induction: Intravenous  Airway Management Planned: Oral ETT  Additional Equipment:   Intra-op Plan:   Post-operative Plan: Extubation in OR  Informed Consent: I have reviewed the patients History and Physical, chart, labs and discussed the procedure including the risks, benefits and alternatives for the proposed anesthesia with the patient or authorized representative who has indicated his/her understanding and acceptance.     Plan Discussed with: CRNA, Anesthesiologist and Surgeon  Anesthesia Plan Comments:         Anesthesia Quick Evaluation

## 2014-05-14 NOTE — Progress Notes (Signed)
Orthopedic Tech Progress Note Patient Details:  Margaret Pace 02/15/1965 [id number] Delivered ankle contracture boot to OR per Dr. Magdalene PatriciaHandy's order. Patient ID: Margaret Pace, female   DOB: 11/04/1965, 49 y.o.   MRN: [id number]   Margaret Pace, Margaret Pace L 05/14/2014, 7:37 PM

## 2014-05-15 ENCOUNTER — Encounter (HOSPITAL_COMMUNITY): Payer: Self-pay | Admitting: Orthopedic Surgery

## 2014-05-15 ENCOUNTER — Inpatient Hospital Stay (HOSPITAL_COMMUNITY): Payer: Self-pay

## 2014-05-15 LAB — COMPREHENSIVE METABOLIC PANEL
ALBUMIN: 2.6 g/dL — AB (ref 3.5–5.2)
ALK PHOS: 83 U/L (ref 39–117)
ALT: 15 U/L (ref 0–35)
AST: 23 U/L (ref 0–37)
Anion gap: 3 — ABNORMAL LOW (ref 5–15)
BUN: 9 mg/dL (ref 6–23)
CALCIUM: 8.2 mg/dL — AB (ref 8.4–10.5)
CO2: 30 mmol/L (ref 19–32)
Chloride: 103 mmol/L (ref 96–112)
Creatinine, Ser: 0.71 mg/dL (ref 0.50–1.10)
GFR calc non Af Amer: >90 mL/min (ref >90)
Glucose, Bld: 232 mg/dL — ABNORMAL HIGH (ref 70–99)
POTASSIUM: 3.6 mmol/L (ref 3.5–5.1)
SODIUM: 136 mmol/L (ref 135–145)
Total Bilirubin: 0.6 mg/dL (ref 0.3–1.2)
Total Protein: 5.6 g/dL — ABNORMAL LOW (ref 6.0–8.3)

## 2014-05-15 LAB — GLUCOSE, CAPILLARY
GLUCOSE-CAPILLARY: 202 mg/dL — AB (ref 70–99)
Glucose-Capillary: 369 mg/dL — ABNORMAL HIGH (ref 70–99)
Glucose-Capillary: 346 mg/dL — ABNORMAL HIGH (ref 70–99)
Glucose-Capillary: 194 mg/dL — ABNORMAL HIGH (ref 70–99)

## 2014-05-15 LAB — VITAMIN D 25 HYDROXY (VIT D DEFICIENCY, FRACTURES): VIT D 25 HYDROXY: 27.9 ng/mL — AB (ref 30.0–100.0)

## 2014-05-15 LAB — CBC
HCT: 24.3 % — ABNORMAL LOW (ref 36.0–46.0)
Hemoglobin: 8.1 g/dL — ABNORMAL LOW (ref 12.0–15.0)
MCH: 27.9 pg (ref 26.0–34.0)
MCHC: 33.3 g/dL (ref 30.0–36.0)
MCV: 83.8 fL (ref 78.0–100.0)
Platelets: 128 10*3/uL — ABNORMAL LOW (ref 150–400)
RBC: 2.9 MIL/uL — AB (ref 3.87–5.11)
RDW: 15.5 % (ref 11.5–15.5)
WBC: 6.8 10*3/uL (ref 4.0–10.5)

## 2014-05-15 MED ORDER — VITAMIN D3 25 MCG (1000 UNIT) PO TABS
1000.0000 [IU] | ORAL_TABLET | Freq: Two times a day (BID) | ORAL | Status: DC
  Administered 2014-05-15 – 2014-05-21 (×13): 1000 [IU] via ORAL
  Filled 2014-05-15 (×26): qty 1

## 2014-05-15 MED ORDER — CIPROFLOXACIN HCL 500 MG PO TABS
500.0000 mg | ORAL_TABLET | Freq: Two times a day (BID) | ORAL | Status: DC
  Administered 2014-05-15 – 2014-05-21 (×13): 500 mg via ORAL
  Filled 2014-05-15 (×12): qty 1

## 2014-05-15 MED ORDER — ADULT MULTIVITAMIN W/MINERALS CH
1.0000 | ORAL_TABLET | Freq: Every day | ORAL | Status: DC
Start: 2014-05-15 — End: 2014-05-21
  Administered 2014-05-15 – 2014-05-21 (×7): 1 via ORAL
  Filled 2014-05-15 (×7): qty 1

## 2014-05-15 MED ORDER — VITAMIN D (ERGOCALCIFEROL) 1.25 MG (50000 UNIT) PO CAPS
50000.0000 [IU] | ORAL_CAPSULE | ORAL | Status: DC
  Administered 2014-05-15: 50000 [IU] via ORAL
  Filled 2014-05-15: qty 1

## 2014-05-15 MED ORDER — INSULIN DETEMIR 100 UNIT/ML ~~LOC~~ SOLN
7.0000 [IU] | Freq: Every day | SUBCUTANEOUS | Status: DC
  Administered 2014-05-15: 7 [IU] via SUBCUTANEOUS
  Filled 2014-05-15 (×2): qty 0.07

## 2014-05-15 MED ORDER — VITAMIN C 500 MG PO TABS
1000.0000 mg | ORAL_TABLET | Freq: Every day | ORAL | Status: DC
  Administered 2014-05-15 – 2014-05-21 (×7): 1000 mg via ORAL
  Filled 2014-05-15 (×7): qty 2
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
## 2014-05-15 NOTE — Progress Notes (Signed)
OT Cancellation Note  Patient Details Name: [name] MRN: [id number] DOB: 05/20/1965   Cancelled Treatment:    Reason Eval/Treat Not Completed: Pain limiting ability to participate. Pt reports 9/10 pian and requests OT return later; family member interpreting. OT will re attempt later today as time allows/as appropriate  Galen ManilaSpencer, Ashantae Pangallo Jeanette 05/15/2014, 1:00 PM
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
## 2014-05-15 NOTE — Progress Notes (Signed)
Spoke with MD/PA regarding d/c medications without insurance. Told him to order care mgmt consult to [facility]  Follow-up and meds. Care Mgt can make appt at the clinic before discharge as long as she has an appt.   Thank you Lenor CoffinAnn Halbert Jesson, RN, MSN, CDE  Diabetes Inpatient Program Office: [phone] Pager: [phone] 8:00 am to 5:00 pm

## 2014-05-15 NOTE — Progress Notes (Signed)
Inpatient Diabetes Program Recommendations  AACE/ADA: New Consensus Statement on Inpatient Glycemic Control (2013)  Target Ranges:  Prepandial:   less than 140 mg/dL      Peak postprandial:   less than 180 mg/dL (1-2 hours)      Critically ill patients:  140 - 180 mg/dL   Results for Margaret Pace, Margaret Pace (MRN [id number]) as of 05/15/2014 08:45  Ref. Range 05/14/2014 12:43 05/14/2014 14:41 05/14/2014 17:47 05/14/2014 18:51 05/14/2014 21:44 05/15/2014 06:41  Glucose-Capillary Latest Range: 70-99 mg/dL 213302 (H) 086269 (H) 578193 (H) 158 (H) 178 (H) 202 (H)   Diabetes history: DM2 Outpatient Diabetes medications: Amaryl 4 mg QAM, Glumetza 1000 mg QAM Current orders for Inpatient glycemic control: Novolog 0-9 units TID with meals, Novolog 0-5 units HS, Novolog 3 units TID with meals  Inpatient Diabetes Program Recommendations Insulin - Basal: Please consider ordering low dose basal insulin; recommend starting with Levemir 7 units Q24H starting now (based on 74 kg x 0.1 units).  Thanks, Orlando PennerMarie Leanord Thibeau, RN, MSN, CCRN, CDE Diabetes Coordinator Inpatient Diabetes Program [phone] (Team Pager from 8am to 5pm) [phone] ([facility] office) [phone] [facility] office)
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
## 2014-05-16 ENCOUNTER — Encounter (HOSPITAL_COMMUNITY): Payer: Self-pay | Admitting: General Practice

## 2014-05-16 LAB — PHOSPHORUS: Phosphorus: 2.9 mg/dL (ref 2.3–4.6)

## 2014-05-16 LAB — COMPREHENSIVE METABOLIC PANEL
ALBUMIN: 2.6 g/dL — AB (ref 3.5–5.2)
ALT: 16 U/L (ref 0–35)
AST: 32 U/L (ref 0–37)
Alkaline Phosphatase: 91 U/L (ref 39–117)
Anion gap: 9 (ref 5–15)
BUN: 6 mg/dL (ref 6–23)
CALCIUM: 8.3 mg/dL — AB (ref 8.4–10.5)
CO2: 25 mmol/L (ref 19–32)
CREATININE: 0.69 mg/dL (ref 0.50–1.10)
Chloride: 103 mmol/L (ref 96–112)
GFR calc Af Amer: >90 mL/min (ref >90)
GLUCOSE: 163 mg/dL — AB (ref 70–99)
Potassium: 3.4 mmol/L — ABNORMAL LOW (ref 3.5–5.1)
Sodium: 137 mmol/L (ref 135–145)
Total Bilirubin: 0.5 mg/dL (ref 0.3–1.2)
Total Protein: 6 g/dL (ref 6.0–8.3)

## 2014-05-16 LAB — GLUCOSE, CAPILLARY
GLUCOSE-CAPILLARY: 171 mg/dL — AB (ref 70–99)
GLUCOSE-CAPILLARY: 215 mg/dL — AB (ref 70–99)
GLUCOSE-CAPILLARY: 149 mg/dL — AB (ref 70–99)
Glucose-Capillary: 287 mg/dL — ABNORMAL HIGH (ref 70–99)

## 2014-05-16 LAB — URINE CULTURE: Colony Count: >=100000

## 2014-05-16 LAB — CBC
HCT: 24 % — ABNORMAL LOW (ref 36.0–46.0)
Hemoglobin: 8 g/dL — ABNORMAL LOW (ref 12.0–15.0)
MCH: 28.6 pg (ref 26.0–34.0)
MCHC: 33.3 g/dL (ref 30.0–36.0)
MCV: 85.7 fL (ref 78.0–100.0)
PLATELETS: 133 10*3/uL — AB (ref 150–400)
RBC: 2.8 MIL/uL — ABNORMAL LOW (ref 3.87–5.11)
RDW: 15.7 % — ABNORMAL HIGH (ref 11.5–15.5)
WBC: 7.4 10*3/uL (ref 4.0–10.5)

## 2014-05-16 LAB — MAGNESIUM: Magnesium: 1.7 mg/dL (ref 1.5–2.5)

## 2014-05-16 LAB — TSH: TSH: 1.75 u[IU]/mL (ref 0.350–4.500)

## 2014-05-16 MED ORDER — PNEUMOCOCCAL VAC POLYVALENT 25 MCG/0.5ML IJ INJ
0.5000 mL | INJECTION | INTRAMUSCULAR | Status: AC
  Administered 2014-05-17: 0.5 mL via INTRAMUSCULAR
  Filled 2014-05-16: qty 0.5

## 2014-05-16 MED ORDER — INSULIN ASPART 100 UNIT/ML ~~LOC~~ SOLN
0.0000 [IU] | Freq: Every day | SUBCUTANEOUS | Status: DC
  Administered 2014-05-17: 2 [IU] via SUBCUTANEOUS

## 2014-05-16 MED ORDER — INSULIN ASPART 100 UNIT/ML ~~LOC~~ SOLN
4.0000 [IU] | Freq: Three times a day (TID) | SUBCUTANEOUS | Status: DC
  Administered 2014-05-16 – 2014-05-18 (×3): 4 [IU] via SUBCUTANEOUS

## 2014-05-16 MED ORDER — INSULIN DETEMIR 100 UNIT/ML ~~LOC~~ SOLN
9.0000 [IU] | Freq: Every day | SUBCUTANEOUS | Status: DC
  Administered 2014-05-16 – 2014-05-17 (×2): 9 [IU] via SUBCUTANEOUS
  Filled 2014-05-16 (×3): qty 0.09

## 2014-05-16 MED ORDER — INSULIN ASPART 100 UNIT/ML ~~LOC~~ SOLN
0.0000 [IU] | Freq: Three times a day (TID) | SUBCUTANEOUS | Status: DC
  Administered 2014-05-16: 8 [IU] via SUBCUTANEOUS
  Administered 2014-05-16: 5 [IU] via SUBCUTANEOUS
  Administered 2014-05-17: 3 [IU] via SUBCUTANEOUS
  Administered 2014-05-18: 5 [IU] via SUBCUTANEOUS
  Administered 2014-05-18: 2 [IU] via SUBCUTANEOUS
  Administered 2014-05-18 – 2014-05-19 (×2): 3 [IU] via SUBCUTANEOUS
  Administered 2014-05-20 (×2): 2 [IU] via SUBCUTANEOUS
  Administered 2014-05-20: 5 [IU] via SUBCUTANEOUS
  Administered 2014-05-21: 3 [IU] via SUBCUTANEOUS

## 2014-05-16 NOTE — Clinical Documentation Improvement (Signed)
MD's, NP's, and PAs  Noted random urine culture grew > 100,000 colonies of E. Coli.  If appropriate please note clinical significance of information.  Thank you    Possible Clinical Conditions?    Ecoli UTI  Bacturia                                 Other Condition                 Cannot Clinically Determine     Evaluation: Urinalysis and urine culture   Thank You, Margaret JumboLawanda J Cherith Tewell ,RN Clinical Documentation Specialist:  [phone]  [facility]- Health Information Management
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
## 2014-05-16 NOTE — Progress Notes (Signed)
Inpatient Diabetes Program Recommendations  AACE/ADA: New Consensus Statement on Inpatient Glycemic Control (2013)  Target Ranges:  Prepandial:   less than 140 mg/dL      Peak postprandial:   less than 180 mg/dL (1-2 hours)      Critically ill patients:  140 - 180 mg/dL   Results for Stanford BreedRAMIREZ, Margaret (MRN [id number]) as of 05/16/2014 09:10  Ref. Range 05/15/2014 06:41 05/15/2014 12:23 05/15/2014 16:55 05/15/2014 21:35 05/16/2014 06:46  Glucose-Capillary Latest Range: 70-99 mg/dL 409202 (H) 811369 (H) 914346 (H) 194 (H) 171 (H)   Diabetes history: DM2 Outpatient Diabetes medications: Amaryl 4 mg QAM, Glumetza 1000 mg QAM Current orders for Inpatient glycemic control: Levemir 7 units QHS, Novolog 0-15 units TID with meals, Novolog 0-5 units HS, Novolog 4 units TID with meals   Inpatient Diabetes Program Recommendations Insulin - Basal: Please consider increasing Levemir to 9 units QHS. If patient will discharge on insulin, will need to change basal insulin to 70/30 since it is more affordable ([facility] 70[facility] for $24.88 per vial whereas Levemir is over $250 per vial). If basal insulin is switched to 70/30 recommend ordering 70/30 8 units BID with meals (if 70/30 ordered be sure to discontinue meal coverage since the 30% is intended to cover carbs from meals).  Thanks, Orlando PennerMarie Tessie Ordaz, RN, MSN, CCRN, CDE Diabetes Coordinator Inpatient Diabetes Program [phone] (Team Pager from 8am to 5pm) [phone] ([facility] office) [phone] [facility] office)

## 2014-05-17 ENCOUNTER — Encounter (HOSPITAL_COMMUNITY)
Admission: EM | Disposition: A | Discharge status: Home Health | Payer: Self-pay | Source: Home / Self Care | Attending: Orthopedic Surgery

## 2014-05-17 ENCOUNTER — Inpatient Hospital Stay (HOSPITAL_COMMUNITY): Payer: MEDICAID | Admitting: Certified Registered Nurse Anesthetist

## 2014-05-17 ENCOUNTER — Inpatient Hospital Stay (HOSPITAL_COMMUNITY): Payer: Self-pay | Admitting: Certified Registered Nurse Anesthetist

## 2014-05-17 ENCOUNTER — Encounter (HOSPITAL_COMMUNITY): Payer: Self-pay | Admitting: Certified Registered Nurse Anesthetist

## 2014-05-17 DIAGNOSIS — N390 Urinary tract infection, site not specified: Secondary | ICD-10-CM | POA: Diagnosis present

## 2014-05-17 HISTORY — PX: SECONDARY CLOSURE OF WOUND: SHX6208

## 2014-05-17 LAB — BASIC METABOLIC PANEL
ANION GAP: 6 (ref 5–15)
CALCIUM: 8.6 mg/dL (ref 8.4–10.5)
CO2: 29 mmol/L (ref 19–32)
Chloride: 101 mmol/L (ref 96–112)
Creatinine, Ser: 0.62 mg/dL (ref 0.50–1.10)
GFR calc Af Amer: >90 mL/min (ref >90)
GLUCOSE: 172 mg/dL — AB (ref 70–99)
Potassium: 3.8 mmol/L (ref 3.5–5.1)
Sodium: 136 mmol/L (ref 135–145)

## 2014-05-17 LAB — ABO/RH: ABO/RH(D): O POS

## 2014-05-17 LAB — PTH, INTACT AND CALCIUM
CALCIUM TOTAL (PTH): 8.5 mg/dL — AB (ref 8.7–10.2)
PTH: 30 pg/mL (ref 15–65)

## 2014-05-17 LAB — PREALBUMIN: Prealbumin: 10 mg/dL — ABNORMAL LOW (ref 18.0–45.0)

## 2014-05-17 LAB — CBC
HCT: 22.1 % — ABNORMAL LOW (ref 36.0–46.0)
Hemoglobin: 7.4 g/dL — ABNORMAL LOW (ref 12.0–15.0)
MCH: 28.7 pg (ref 26.0–34.0)
MCHC: 33.5 g/dL (ref 30.0–36.0)
MCV: 85.7 fL (ref 78.0–100.0)
Platelets: 143 10*3/uL — ABNORMAL LOW (ref 150–400)
RBC: 2.58 MIL/uL — ABNORMAL LOW (ref 3.87–5.11)
RDW: 15.7 % — AB (ref 11.5–15.5)
WBC: 6.4 10*3/uL (ref 4.0–10.5)

## 2014-05-17 LAB — PREPARE RBC (CROSSMATCH)

## 2014-05-17 LAB — GLUCOSE, CAPILLARY
GLUCOSE-CAPILLARY: 159 mg/dL — AB (ref 70–99)
GLUCOSE-CAPILLARY: 140 mg/dL — AB (ref 70–99)
Glucose-Capillary: 213 mg/dL — ABNORMAL HIGH (ref 70–99)

## 2014-05-17 LAB — HEMOGLOBIN A1C
Hgb A1c MFr Bld: 8.9 % — ABNORMAL HIGH (ref 4.8–5.6)
Mean Plasma Glucose: 209 mg/dL

## 2014-05-17 SURGERY — SECONDARY CLOSURE OF WOUND
Anesthesia: General | Site: Leg Lower | Laterality: Left

## 2014-05-17 MED ORDER — DIPHENHYDRAMINE HCL 50 MG/ML IJ SOLN
INTRAMUSCULAR | Status: DC | PRN
  Administered 2014-05-17: 25 mg via INTRAVENOUS

## 2014-05-17 MED ORDER — HYDROMORPHONE HCL 1 MG/ML IJ SOLN
INTRAMUSCULAR | Status: AC
  Administered 2014-05-17: 0.25 mg via INTRAVENOUS
  Filled 2014-05-17: qty 1

## 2014-05-17 MED ORDER — DIPHENHYDRAMINE HCL 50 MG/ML IJ SOLN
INTRAMUSCULAR | Status: AC
  Filled 2014-05-17: qty 2

## 2014-05-17 MED ORDER — LACTATED RINGERS IV SOLN
INTRAVENOUS | Status: DC
  Administered 2014-05-17: 50 mL/h via INTRAVENOUS

## 2014-05-17 MED ORDER — METFORMIN HCL ER 500 MG PO TB24
1000.0000 mg | ORAL_TABLET | Freq: Every day | ORAL | Status: DC
  Administered 2014-05-18 – 2014-05-21 (×4): 1000 mg via ORAL
  Filled 2014-05-17 (×4): qty 2

## 2014-05-17 MED ORDER — ALBUTEROL SULFATE HFA 108 (90 BASE) MCG/ACT IN AERS
INHALATION_SPRAY | RESPIRATORY_TRACT | Status: AC
  Filled 2014-05-17: qty 6.7

## 2014-05-17 MED ORDER — PROPOFOL INFUSION 10 MG/ML OPTIME
INTRAVENOUS | Status: DC | PRN
  Administered 2014-05-17: 25 ug/kg/min via INTRAVENOUS

## 2014-05-17 MED ORDER — FUROSEMIDE 10 MG/ML IJ SOLN
20.0000 mg | Freq: Once | INTRAMUSCULAR | Status: AC
  Administered 2014-05-18: 20 mg via INTRAVENOUS
  Filled 2014-05-17: qty 2

## 2014-05-17 MED ORDER — MIDAZOLAM HCL 2 MG/2ML IJ SOLN
INTRAMUSCULAR | Status: AC
  Filled 2014-05-17: qty 2

## 2014-05-17 MED ORDER — ONDANSETRON HCL 4 MG/2ML IJ SOLN
INTRAMUSCULAR | Status: DC | PRN
  Administered 2014-05-17: 4 mg via INTRAVENOUS

## 2014-05-17 MED ORDER — PROMETHAZINE HCL 25 MG/ML IJ SOLN: 6.2500 mg | INTRAMUSCULAR | Status: DC | PRN

## 2014-05-17 MED ORDER — BUPIVACAINE IN DEXTROSE 0.75-8.25 % IT SOLN
INTRATHECAL | Status: DC | PRN
  Administered 2014-05-17: 13.5 mg via INTRATHECAL

## 2014-05-17 MED ORDER — SODIUM CHLORIDE 0.9 % IR SOLN
Status: DC | PRN
  Administered 2014-05-17: 1000 mL

## 2014-05-17 MED ORDER — PROPOFOL 10 MG/ML IV BOLUS
INTRAVENOUS | Status: AC
  Filled 2014-05-17: qty 20

## 2014-05-17 MED ORDER — MIDAZOLAM HCL 5 MG/5ML IJ SOLN
INTRAMUSCULAR | Status: DC | PRN
  Administered 2014-05-17: 1 mg via INTRAVENOUS

## 2014-05-17 MED ORDER — FUROSEMIDE 10 MG/ML IJ SOLN
20.0000 mg | Freq: Once | INTRAMUSCULAR | Status: AC
  Administered 2014-05-17: 20 mg via INTRAVENOUS
  Filled 2014-05-17: qty 2

## 2014-05-17 MED ORDER — MIDAZOLAM HCL 2 MG/2ML IJ SOLN: 0.5000 mg | Freq: Once | INTRAMUSCULAR | Status: DC | PRN

## 2014-05-17 MED ORDER — DEXAMETHASONE SODIUM PHOSPHATE 4 MG/ML IJ SOLN
INTRAMUSCULAR | Status: AC
  Filled 2014-05-17: qty 1

## 2014-05-17 MED ORDER — FENTANYL CITRATE 0.05 MG/ML IJ SOLN
INTRAMUSCULAR | Status: AC
  Filled 2014-05-17: qty 5

## 2014-05-17 MED ORDER — ACETAMINOPHEN 325 MG PO TABS
650.0000 mg | ORAL_TABLET | Freq: Four times a day (QID) | ORAL | Status: DC | PRN
  Administered 2014-05-17: 650 mg via ORAL
  Filled 2014-05-17: qty 2

## 2014-05-17 MED ORDER — MEPERIDINE HCL 25 MG/ML IJ SOLN: 6.2500 mg | INTRAMUSCULAR | Status: DC | PRN

## 2014-05-17 MED ORDER — ARTIFICIAL TEARS OP OINT
TOPICAL_OINTMENT | OPHTHALMIC | Status: AC
  Filled 2014-05-17: qty 3.5

## 2014-05-17 MED ORDER — SODIUM CHLORIDE 0.9 % IV SOLN
Freq: Once | INTRAVENOUS | Status: AC
  Administered 2014-05-17: via INTRAVENOUS

## 2014-05-17 MED ORDER — CEFAZOLIN SODIUM 1-5 GM-% IV SOLN
1.0000 g | Freq: Three times a day (TID) | INTRAVENOUS | Status: AC
  Administered 2014-05-17 – 2014-05-18 (×3): 1 g via INTRAVENOUS
  Filled 2014-05-17 (×3): qty 50

## 2014-05-17 MED ORDER — ONDANSETRON HCL 4 MG/2ML IJ SOLN
INTRAMUSCULAR | Status: AC
Start: 2014-05-17 — End: 2014-05-17
  Filled 2014-05-17: qty 2

## 2014-05-17 MED ORDER — HYDROMORPHONE HCL 1 MG/ML IJ SOLN
0.2500 mg | INTRAMUSCULAR | Status: DC | PRN
  Administered 2014-05-17: 0.5 mg via INTRAVENOUS
  Administered 2014-05-17 (×2): 0.25 mg via INTRAVENOUS

## 2014-05-17 MED ORDER — ROCURONIUM BROMIDE 50 MG/5ML IV SOLN
INTRAVENOUS | Status: AC
  Filled 2014-05-17: qty 1

## 2014-05-17 SURGICAL SUPPLY — 50 items
BANDAGE ELASTIC 4 VELCRO ST LF (GAUZE/BANDAGES/DRESSINGS) ×2 IMPLANT
BANDAGE ELASTIC 6 VELCRO ST LF (GAUZE/BANDAGES/DRESSINGS) ×2 IMPLANT
BLADE SURG 10 STRL SS (BLADE) ×3 IMPLANT
BNDG COHESIVE 4X5 TAN STRL (GAUZE/BANDAGES/DRESSINGS) ×3 IMPLANT
BNDG GAUZE ELAST 4 BULKY (GAUZE/BANDAGES/DRESSINGS) ×4 IMPLANT
BNDG GAUZE STRTCH 6 (GAUZE/BANDAGES/DRESSINGS) ×3 IMPLANT
BRUSH SCRUB DISP (MISCELLANEOUS) ×6 IMPLANT
COVER SURGICAL LIGHT HANDLE (MISCELLANEOUS) ×4 IMPLANT
DRAPE C-ARMOR (DRAPES) ×1 IMPLANT
DRAPE U-SHAPE 47X51 STRL (DRAPES) ×3 IMPLANT
DRSG ADAPTIC 3X8 NADH LF (GAUZE/BANDAGES/DRESSINGS) ×5 IMPLANT
ELECT CAUTERY BLADE 6.4 (BLADE) ×2 IMPLANT
ELECT REM PT RETURN 9FT ADLT (ELECTROSURGICAL)
ELECTRODE REM PT RTRN 9FT ADLT (ELECTROSURGICAL) IMPLANT
GAUZE SPONGE 4X4 12PLY STRL (GAUZE/BANDAGES/DRESSINGS) ×3 IMPLANT
GLOVE BIO SURGEON STRL SZ7.5 (GLOVE) ×1 IMPLANT
GLOVE BIO SURGEON STRL SZ8 (GLOVE) ×3 IMPLANT
GLOVE BIOGEL PI IND STRL 7.5 (GLOVE) ×1 IMPLANT
GLOVE BIOGEL PI IND STRL 8 (GLOVE) ×1 IMPLANT
GLOVE BIOGEL PI INDICATOR 7.5 (GLOVE) ×2
GLOVE BIOGEL PI INDICATOR 8 (GLOVE) ×2
GOWN STRL REUS W/ TWL LRG LVL3 (GOWN DISPOSABLE) ×2 IMPLANT
GOWN STRL REUS W/ TWL XL LVL3 (GOWN DISPOSABLE) ×1 IMPLANT
GOWN STRL REUS W/TWL LRG LVL3 (GOWN DISPOSABLE) ×6
GOWN STRL REUS W/TWL XL LVL3 (GOWN DISPOSABLE) ×3
HANDPIECE INTERPULSE COAX TIP (DISPOSABLE)
KIT BASIN OR (CUSTOM PROCEDURE TRAY) ×3 IMPLANT
KIT ROOM TURNOVER OR (KITS) ×3 IMPLANT
MANIFOLD NEPTUNE II (INSTRUMENTS) ×3 IMPLANT
NS IRRIG 1000ML POUR BTL (IV SOLUTION) ×3 IMPLANT
PACK ORTHO EXTREMITY (CUSTOM PROCEDURE TRAY) ×3 IMPLANT
PAD ARMBOARD 7.5X6 YLW CONV (MISCELLANEOUS) ×4 IMPLANT
PADDING CAST COTTON 6X4 STRL (CAST SUPPLIES) ×3 IMPLANT
SET HNDPC FAN SPRY TIP SCT (DISPOSABLE) IMPLANT
SPLINT PLASTER CAST XFAST 5X30 (CAST SUPPLIES) IMPLANT
SPLINT PLASTER XFAST SET 5X30 (CAST SUPPLIES) ×2
SPONGE LAP 18X18 X RAY DECT (DISPOSABLE) ×3 IMPLANT
STOCKINETTE IMPERVIOUS 9X36 MD (GAUZE/BANDAGES/DRESSINGS) ×3 IMPLANT
SUT ETHILON 3 0 PS 1 (SUTURE) ×8 IMPLANT
SUT PDS AB 2-0 CT1 27 (SUTURE) IMPLANT
SUT VIC AB 2-0 CT1 27 (SUTURE) ×6
SUT VIC AB 2-0 CT1 TAPERPNT 27 (SUTURE) IMPLANT
TOWEL OR 17X24 6PK STRL BLUE (TOWEL DISPOSABLE) ×3 IMPLANT
TOWEL OR 17X26 10 PK STRL BLUE (TOWEL DISPOSABLE) ×6 IMPLANT
TUBE ANAEROBIC SPECIMEN COL (MISCELLANEOUS) IMPLANT
TUBE CONNECTING 12'X1/4 (SUCTIONS) ×1
TUBE CONNECTING 12X1/4 (SUCTIONS) ×2 IMPLANT
UNDERPAD 30X30 INCONTINENT (UNDERPADS AND DIAPERS) ×3 IMPLANT
WATER STERILE IRR 1000ML POUR (IV SOLUTION) ×1 IMPLANT
YANKAUER SUCT BULB TIP NO VENT (SUCTIONS) ×3 IMPLANT

## 2014-05-17 NOTE — Progress Notes (Signed)
Spoke to [place] in short stay OR (ext. [phone]). Gave report on patient.
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
## 2014-05-17 NOTE — Anesthesia Preprocedure Evaluation (Signed)
Anesthesia Evaluation  Patient identified by MRN, date of birth, ID band Patient awake    Reviewed: Allergy & Precautions, NPO status , Patient's Chart, lab work & pertinent test results  Airway Mallampati: I       Dental  (+) Teeth Intact   Pulmonary          Cardiovascular     Neuro/Psych    GI/Hepatic   Endo/Other  diabetes, Type 2  Renal/GU      Musculoskeletal   Abdominal   Peds  Hematology   Anesthesia Other Findings   Reproductive/Obstetrics                             Anesthesia Physical Anesthesia Plan  ASA: II  Anesthesia Plan: Spinal   Post-op Pain Management:    Induction:   Airway Management Planned: Simple Face Mask  Additional Equipment:   Intra-op Plan:   Post-operative Plan:   Informed Consent: I have reviewed the patients History and Physical, chart, labs and discussed the procedure including the risks, benefits and alternatives for the proposed anesthesia with the patient or authorized representative who has indicated his/her understanding and acceptance.   Dental advisory given  Plan Discussed with: CRNA, Anesthesiologist and Surgeon  Anesthesia Plan Comments:         Anesthesia Quick Evaluation

## 2014-05-17 NOTE — Transfer of Care (Signed)
Immediate Anesthesia Transfer of Care Note  Patient: [name]  Procedure(s) Performed: Procedure(s): WOUND CLOSURE LEFT LEG  (Left)  Patient Location: PACU  Anesthesia Type:Spinal  Level of Consciousness: awake, alert  and oriented  Airway & Oxygen Therapy: Patient Spontanous Breathing  Post-op Assessment: Report given to RN and Post -op Vital signs reviewed and stable  Post vital signs: Reviewed and stable  Last Vitals:  Filed Vitals:   05/17/14 1014  BP:   Pulse:   Temp:   Resp: 16    Complications: No apparent anesthesia complications

## 2014-05-17 NOTE — Anesthesia Procedure Notes (Signed)
Spinal Patient location during procedure: OR Start time: 05/17/2014 1:39 PM End time: 05/17/2014 1:44 PM Staffing Anesthesiologist: Jairo BenJACKSON, Aleshia Cartelli Performed by: anesthesiologist  Preanesthetic Checklist Completed: patient identified, surgical consent, pre-op evaluation, timeout performed, IV checked, risks and benefits discussed and monitors and equipment checked Spinal Block Patient position: sitting Prep: Betadine Patient monitoring: heart rate, cardiac monitor, continuous pulse ox and blood pressure Approach: midline Location: L2-3 Injection technique: single-shot Needle Needle type: Quincke  Needle gauge: 25 G Needle length: 5 cm Assessment Events: slow CSF return/unable to aspirate at L3,4, single pass L2,3 with asp CSF beginning/end of injection Additional Notes Pt identified in OR.  Monitors applied. Working IV access confirmed. Sitting position. Sterile prep lumbar spine.  #25ga Quincke into clear CSF L3,4 but unable to asp CSF. Needle withdrawn and into clear CSF at L2,3.  13.5mg   0.75% Bupivacaine with dextrose injected with CSF asp at beginning and end of injection.  Patient asymptomatic, VSS, tolerated well.  Sandford Craze Crytal Pensinger, MD

## 2014-05-17 NOTE — Progress Notes (Signed)
2200- During pre transfusion vital sign check pts temp 101.6 F, pts blood returned to blood bank and MD on call notified. Received verbal orders to administer 650 mg of tylenol, wait one hour and recheck temperature, if temperature had decreased I am to go forth with the transfusion.   2330- Pts temp decreased to 100.0 F. 1 Unit of PRBC's were administered to pt pre MD order. Pt in no signs of distress and resting comfortably. Nursing will continue to monitor.

## 2014-05-17 NOTE — Brief Op Note (Signed)
05/14/2014 - 05/17/2014  2:12 PM  PATIENT:  [name]  49 y.o. female  PRE-OPERATIVE DIAGNOSIS:  OPEN FASCIOTOMY WOUNDS LEFT LEG  POST-OPERATIVE DIAGNOSIS:  OPEN FASCIOTOMY WOUNDS LEFT LEG  PROCEDURE:  Procedure(s): 1. WOUND CLOSURES LEFT LEG  (Left), TOTAL 31CM 2. SHORT LEG SPLINT  SURGEON:  Surgeon(s) and Role:    * Myrene GalasMichael Mario Voong, MD - Primary  PHYSICIAN ASSISTANT: Montez MoritaKeith Paul, PA-C  ANESTHESIA:   Spinal  I/O:  Total I/O In: 900 [I.V.:900] Out: 425 [Urine:425]  SPECIMEN:  No Specimen  TOURNIQUET:  * No tourniquets in log *  DICTATION: .Other Dictation: Dictation Number [id number]

## 2014-05-17 NOTE — Progress Notes (Signed)
Utilization review completed.  

## 2014-05-17 NOTE — Progress Notes (Signed)
OT Cancellation Note  Patient Details Name: Margaret Pace MRN: [id number] DOB: 04/05/1965   Cancelled Treatment:    Reason Eval/Treat Not Completed: Patient at procedure or test/ unavailable. Pt gone for fasciotomy   Galen ManilaSpencer, Keyston Ardolino Jeanette 05/17/2014, 11:24 AM

## 2014-05-17 NOTE — Progress Notes (Signed)
Returning to the OR for surgery today. No new complaints.  No change in examination.   I discussed with the patient the risks and benefits of surgery for closure of her left leg fasciotomies and short leg splinting, including the possibility of infection, nerve injury, vessel injury, wound breakdown, arthritis, symptomatic hardware, DVT/ PE, loss of motion, and need for further surgery among others.  She understood these risks and wished to proceed.  Myrene GalasMichael Myking Sar, MD [facility] [phone] (p)

## 2014-05-18 ENCOUNTER — Encounter (HOSPITAL_COMMUNITY): Payer: Self-pay | Admitting: Orthopedic Surgery

## 2014-05-18 LAB — TYPE AND SCREEN
ABO/RH(D): O POS
Antibody Screen: NEGATIVE
UNIT DIVISION: 0
Unit division: 0

## 2014-05-18 LAB — GLUCOSE, CAPILLARY
GLUCOSE-CAPILLARY: 207 mg/dL — AB (ref 70–99)
GLUCOSE-CAPILLARY: 175 mg/dL — AB (ref 70–99)
Glucose-Capillary: 144 mg/dL — ABNORMAL HIGH (ref 70–99)
Glucose-Capillary: 108 mg/dL — ABNORMAL HIGH (ref 70–99)

## 2014-05-18 LAB — CBC
HEMATOCRIT: 32.1 % — AB (ref 36.0–46.0)
HEMOGLOBIN: 10.8 g/dL — AB (ref 12.0–15.0)
MCH: 28.5 pg (ref 26.0–34.0)
MCHC: 33.6 g/dL (ref 30.0–36.0)
MCV: 84.7 fL (ref 78.0–100.0)
Platelets: 148 10*3/uL — ABNORMAL LOW (ref 150–400)
RBC: 3.79 MIL/uL — ABNORMAL LOW (ref 3.87–5.11)
RDW: 15 % (ref 11.5–15.5)
WBC: 6.3 10*3/uL (ref 4.0–10.5)

## 2014-05-18 LAB — TRANSFERRIN: TRANSFERRIN: 173 mg/dL — AB (ref 200–370)

## 2014-05-18 MED ORDER — INSULIN ASPART PROT & ASPART (70-30 MIX) 100 UNIT/ML ~~LOC~~ SUSP
8.0000 [IU] | Freq: Two times a day (BID) | SUBCUTANEOUS | Status: DC
  Administered 2014-05-18 – 2014-05-21 (×6): 8 [IU] via SUBCUTANEOUS
  Filled 2014-05-18: qty 10

## 2014-05-18 MED ORDER — INSULIN STARTER KIT- SYRINGES (SPANISH)
1.0000 | Freq: Once | Status: DC
  Filled 2014-05-18: qty 1
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
## 2014-05-18 NOTE — Anesthesia Postprocedure Evaluation (Signed)
  Anesthesia Post-op Note  Patient: Margaret Pace  Procedure(s) Performed: Procedure(s): WOUND CLOSURE LEFT LEG  (Left)  Patient Location: PACU  Anesthesia Type:Spinal  Level of Consciousness: awake, alert , oriented, patient cooperative and responds to stimulation  Airway and Oxygen Therapy: Patient Spontanous Breathing  Post-op Pain: none  Post-op Assessment: Post-op Vital signs reviewed, Patient's Cardiovascular Status Stable, Respiratory Function Stable, Patent Airway, No signs of Nausea or vomiting, Pain level controlled, No headache and No backache, spinal motor block receding  Post-op Vital Signs: Reviewed and stable  Last Vitals:  Filed Vitals:   05/18/14 0800  BP:   Pulse:   Temp:   Resp: 18    Complications: No apparent anesthesia complications (delayed note, pt eval in PACU)
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
## 2014-05-18 NOTE — Progress Notes (Signed)
PT Cancellation Note  Patient Details Name: [name] MRN: [id number] DOB: 11/05/1965   Cancelled Treatment:    Reason Eval/Treat Not Completed: Other (comment).  Pt reports (via daughter who is translating) that she worked with OT ~1 hour ago and is in significant pain now. She would prefer to work with PT in AM.  PT will check back in AM.    Thanks,    Lurena Joinerebecca B. Chidi Shirer, PT, DPT [phone]   05/18/2014, 6:00 PM
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
## 2014-05-18 NOTE — Progress Notes (Signed)
Inpatient Diabetes Program Recommendations  AACE/ADA: New Consensus Statement on Inpatient Glycemic Control (2013)  Target Ranges:  Prepandial:   less than 140 mg/dL      Peak postprandial:   less than 180 mg/dL (1-2 hours)      Critically ill patients:  140 - 180 mg/dL    Results for LENEE, FRANZE (MRN [id number]) as of 05/18/2014 16:35  Ref. Range 05/17/2014 06:39 05/17/2014 14:22 05/17/2014 21:53 05/18/2014 06:33 05/18/2014 11:27 05/18/2014 16:24  Glucose-Capillary Latest Range: 70-99 mg/dL 159 (H) 140 (H) 213 (H) 207 (H) 144 (H) 175 (H)   Pt to start on 70/30 8 units bid at 1700. Ordered Insulin Starter Kit in Spanish and briefly mentioned going home on insulin. Pt was working with OT at the time of my visit.  RN to begin teaching insulin administration with syringe. Will need appt with a PCP to manage diabetes after discharge. Recommend [facility] with case management assisting with appt. Will need prescription for meter and supplies which pt may obtain from [facility].  Will order OP Diabetes Education consult for uncontrolled DM and new to insulin.  Continue to follow while inpatient. Thank you. Lorenda Peck, RD, LDN, CDE Inpatient Diabetes Coordinator [phone]

## 2014-05-19 LAB — CBC
HCT: 31 % — ABNORMAL LOW (ref 36.0–46.0)
Hemoglobin: 10.3 g/dL — ABNORMAL LOW (ref 12.0–15.0)
MCH: 28.4 pg (ref 26.0–34.0)
MCHC: 33.2 g/dL (ref 30.0–36.0)
MCV: 85.4 fL (ref 78.0–100.0)
Platelets: 157 10*3/uL (ref 150–400)
RBC: 3.63 MIL/uL — ABNORMAL LOW (ref 3.87–5.11)
RDW: 15 % (ref 11.5–15.5)
WBC: 5.9 10*3/uL (ref 4.0–10.5)

## 2014-05-19 LAB — GLUCOSE, CAPILLARY
GLUCOSE-CAPILLARY: 164 mg/dL — AB (ref 70–99)
Glucose-Capillary: 122 mg/dL — ABNORMAL HIGH (ref 70–99)
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
## 2014-05-20 LAB — CBC
HCT: 32.4 % — ABNORMAL LOW (ref 36.0–46.0)
Hemoglobin: 10.8 g/dL — ABNORMAL LOW (ref 12.0–15.0)
MCH: 28.3 pg (ref 26.0–34.0)
MCHC: 33.3 g/dL (ref 30.0–36.0)
MCV: 84.8 fL (ref 78.0–100.0)
PLATELETS: 184 10*3/uL (ref 150–400)
RBC: 3.82 MIL/uL — ABNORMAL LOW (ref 3.87–5.11)
RDW: 15 % (ref 11.5–15.5)
WBC: 7 10*3/uL (ref 4.0–10.5)

## 2014-05-20 LAB — GLUCOSE, CAPILLARY
GLUCOSE-CAPILLARY: 114 mg/dL — AB (ref 70–99)
GLUCOSE-CAPILLARY: 114 mg/dL — AB (ref 70–99)
GLUCOSE-CAPILLARY: 140 mg/dL — AB (ref 70–99)
GLUCOSE-CAPILLARY: 215 mg/dL — AB (ref 70–99)
GLUCOSE-CAPILLARY: 136 mg/dL — AB (ref 70–99)
GLUCOSE-CAPILLARY: 95 mg/dL (ref 70–99)
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
## 2014-05-21 DIAGNOSIS — D62 Acute posthemorrhagic anemia: Secondary | ICD-10-CM

## 2014-05-21 LAB — CBC
HCT: 33.8 % — ABNORMAL LOW (ref 36.0–46.0)
Hemoglobin: 11.1 g/dL — ABNORMAL LOW (ref 12.0–15.0)
MCH: 28.2 pg (ref 26.0–34.0)
MCHC: 32.8 g/dL (ref 30.0–36.0)
MCV: 86 fL (ref 78.0–100.0)
Platelets: 197 10*3/uL (ref 150–400)
RBC: 3.93 MIL/uL (ref 3.87–5.11)
RDW: 15.2 % (ref 11.5–15.5)
WBC: 5.9 10*3/uL (ref 4.0–10.5)

## 2014-05-21 LAB — GLUCOSE, CAPILLARY
Glucose-Capillary: 110 mg/dL — ABNORMAL HIGH (ref 70–99)
Glucose-Capillary: 199 mg/dL — ABNORMAL HIGH (ref 70–99)

## 2014-05-21 LAB — CREATININE, SERUM
CREATININE: 0.7 mg/dL (ref 0.50–1.10)
GFR calc Af Amer: >90 mL/min (ref >90)
GFR calc non Af Amer: >90 mL/min (ref >90)

## 2014-05-21 MED ORDER — METFORMIN HCL ER (MOD) 1000 MG PO TB24: 1000.0000 mg | ORAL_TABLET | Freq: Every day | ORAL | Status: DC

## 2014-05-21 MED ORDER — ASPIRIN EC 325 MG PO TBEC: 325.0000 mg | DELAYED_RELEASE_TABLET | Freq: Every day | ORAL | Status: DC

## 2014-05-21 MED ORDER — VITAMIN D (ERGOCALCIFEROL) 1.25 MG (50000 UNIT) PO CAPS: 50000.0000 [IU] | ORAL_CAPSULE | ORAL | Status: DC

## 2014-05-21 MED ORDER — INSULIN SYRINGES (DISPOSABLE) U-100 0.3 ML MISC: 8.0000 [IU] | Freq: Two times a day (BID) | Status: DC

## 2014-05-21 MED ORDER — ACETAMINOPHEN 325 MG PO TABS: 650.0000 mg | ORAL_TABLET | Freq: Four times a day (QID) | ORAL | Status: DC | PRN

## 2014-05-21 MED ORDER — OXYCODONE-ACETAMINOPHEN 5-325 MG PO TABS: 1.0000 | ORAL_TABLET | Freq: Four times a day (QID) | ORAL | Status: DC | PRN

## 2014-05-21 MED ORDER — ASCORBIC ACID 1000 MG PO TABS: 1000.0000 mg | ORAL_TABLET | Freq: Every day | ORAL | Status: DC

## 2014-05-21 MED ORDER — VITAMIN D3 25 MCG (1000 UNIT) PO TABS: 1000.0000 [IU] | ORAL_TABLET | Freq: Two times a day (BID) | ORAL | Status: DC

## 2014-05-21 MED ORDER — INSULIN ASPART PROT & ASPART (70-30 MIX) 100 UNIT/ML ~~LOC~~ SUSP: 8.0000 [IU] | Freq: Two times a day (BID) | SUBCUTANEOUS | Status: DC

## 2014-05-21 MED ORDER — INSULIN STARTER KIT- SYRINGES (SPANISH): 1.0000 | Freq: Once | Status: DC

## 2014-05-21 MED ORDER — DOCUSATE SODIUM 100 MG PO CAPS: 100.0000 mg | ORAL_CAPSULE | Freq: Two times a day (BID) | ORAL | Status: DC

## 2014-05-21 MED ORDER — BLOOD GLUCOSE METER KIT: PACK | Status: DC

## 2014-05-21 MED ORDER — OXYCODONE HCL 5 MG PO TABS: 5.0000 mg | ORAL_TABLET | Freq: Four times a day (QID) | ORAL | Status: DC | PRN

## 2014-05-21 MED ORDER — ADULT MULTIVITAMIN W/MINERALS CH: 1.0000 | ORAL_TABLET | Freq: Every day | ORAL | Status: DC

## 2014-05-21 MED ORDER — METHOCARBAMOL 500 MG PO TABS: 500.0000 mg | ORAL_TABLET | Freq: Four times a day (QID) | ORAL | Status: DC | PRN
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
## 2014-05-21 NOTE — Discharge Instructions (Signed)
[facility] Discharge Instructions   General Discharge Instructions  WEIGHT BEARING STATUS: Nonweightbearing Left leg  RANGE OF MOTION/ACTIVITY:no ankle range of motion. Ok to move knee   Wound care: do not remove splint. Do not get splint wet   Diet: as you were eating previously.  Can use over the counter stool softeners and bowel preparations, such as Miralax, to help with bowel movements.  Narcotics can be constipating.  Be sure to drink plenty of fluids  STOP SMOKING OR USING NICOTINE PRODUCTS!!!!  As discussed nicotine severely impairs your body's ability to heal surgical and traumatic wounds but also impairs bone healing.  Wounds and bone heal by forming microscopic blood vessels (angiogenesis) and nicotine is a vasoconstrictor (essentially, shrinks blood vessels).  Therefore, if vasoconstriction occurs to these microscopic blood vessels they essentially disappear and are unable to deliver necessary nutrients to the healing tissue.  This is one modifiable factor that you can do to dramatically increase your chances of healing your injury.    (This means no smoking, no nicotine gum, patches, etc)  DO NOT USE NONSTEROIDAL ANTI-INFLAMMATORY DRUGS (NSAID'S)  Using products such as Advil (ibuprofen), Aleve (naproxen), Motrin (ibuprofen) for additional pain control during fracture healing can delay and/or prevent the healing response.  If you would like to take over the counter (OTC) medication, Tylenol (acetaminophen) is ok.  However, some narcotic medications that are given for pain control contain acetaminophen as well. Therefore, you should not exceed more than 4000 mg of tylenol in a day if you do not have liver disease.  Also note that there are may OTC medicines, such as cold medicines and allergy medicines that my contain tylenol as well.  If you have any questions about medications and/or interactions please ask your doctor/PA or your pharmacist.   PAIN MEDICATION USE AND  EXPECTATIONS  You have likely been given narcotic medications to help control your pain.  After a traumatic event that results in an fracture (broken bone) with or without surgery, it is ok to use narcotic pain medications to help control one's pain.  We understand that everyone responds to pain differently and each individual patient will be evaluated on a regular basis for the continued need for narcotic medications. Ideally, narcotic medication use should last no more than 6-8 weeks (coinciding with fracture healing).   As a patient it is your responsibility as well to monitor narcotic medication use and report the amount and frequency you use these medications when you come to your office visit.   We would also advise that if you are using narcotic medications, you should take a dose prior to therapy to maximize you participation.  IF YOU ARE ON NARCOTIC MEDICATIONS IT IS NOT PERMISSIBLE TO OPERATE A MOTOR VEHICLE (MOTORCYCLE/CAR/TRUCK/MOPED) OR HEAVY MACHINERY DO NOT MIX NARCOTICS WITH OTHER CNS (CENTRAL NERVOUS SYSTEM) DEPRESSANTS SUCH AS ALCOHOL       ICE AND ELEVATE INJURED/OPERATIVE EXTREMITY  Using ice and elevating the injured extremity above your heart can help with swelling and pain control.  Icing in a pulsatile fashion, such as 20 minutes on and 20 minutes off, can be followed.    Do not place ice directly on skin. Make sure there is a barrier between to skin and the ice pack.    Using frozen items such as frozen peas works well as the conform nicely to the are that needs to be iced.  USE AN ACE WRAP OR TED HOSE FOR SWELLING CONTROL  In addition  to icing and elevation, Ace wraps or TED hose are used to help limit and resolve swelling.  It is recommended to use Ace wraps or TED hose until you are informed to stop.    When using Ace Wraps start the wrapping distally (farthest away from the body) and wrap proximally (closer to the body)   Example: If you had surgery on your leg or  thing and you do not have a splint on, start the ace wrap at the toes and work your way up to the thigh        If you had surgery on your upper extremity and do not have a splint on, start the ace wrap at your fingers and work your way up to the upper arm  IF YOU ARE IN A SPLINT OR CAST DO NOT REMOVE IT FOR ANY REASON   If your splint gets wet for any reason please contact the office immediately. You may shower in your splint or cast as long as you keep it dry.  This can be done by wrapping in a cast cover or garbage back (or similar)  Do Not stick any thing down your splint or cast such as pencils, money, or hangers to try and scratch yourself with.  If you feel itchy take benadryl as prescribed on the bottle for itching  IF YOU ARE IN A CAM BOOT (BLACK BOOT)  You may remove boot periodically. Perform daily dressing changes as noted below.  Wash the liner of the boot regularly and wear a sock when wearing the boot. It is recommended that you sleep in the boot until told otherwise  CALL THE OFFICE WITH ANY QUESTIONS OR CONCERTS: [phone]
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
## 2014-05-24 ENCOUNTER — Encounter: Payer: Self-pay | Admitting: Family Medicine

## 2014-05-24 ENCOUNTER — Ambulatory Visit: Payer: Self-pay | Attending: Family Medicine | Admitting: Family Medicine

## 2014-05-24 VITALS — BP 124/76 | HR 68 | Temp 97.9°F | Resp 16 | Ht 63.0 in | Wt 160.0 lb

## 2014-05-24 DIAGNOSIS — N390 Urinary tract infection, site not specified: Secondary | ICD-10-CM

## 2014-05-24 DIAGNOSIS — S82262D Displaced segmental fracture of shaft of left tibia, subsequent encounter for closed fracture with routine healing: Secondary | ICD-10-CM

## 2014-05-24 DIAGNOSIS — K5909 Other constipation: Secondary | ICD-10-CM

## 2014-05-24 DIAGNOSIS — E1129 Type 2 diabetes mellitus with other diabetic kidney complication: Secondary | ICD-10-CM

## 2014-05-24 LAB — GLUCOSE, POCT (MANUAL RESULT ENTRY): POC Glucose: 195 mg/dl — AB (ref 70–99)

## 2014-05-24 MED ORDER — LACTULOSE 10 GM/15ML PO SOLN: 10.0000 g | Freq: Three times a day (TID) | ORAL | Status: DC

## 2014-05-24 MED ORDER — METFORMIN HCL 500 MG PO TABS: ORAL_TABLET | ORAL | Status: DC

## 2014-05-24 MED ORDER — LISINOPRIL 5 MG PO TABS: 5.0000 mg | ORAL_TABLET | Freq: Every day | ORAL | Status: DC
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
## 2014-05-24 NOTE — Patient Instructions (Signed)
Diabetes Mellitus and Food It is important for you to manage your blood sugar (glucose) level. Your blood glucose level can be greatly affected by what you eat. Eating healthier foods in the appropriate amounts throughout the day at about the same time each day will help you control your blood glucose level. It can also help slow or prevent worsening of your diabetes mellitus. Healthy eating may even help you improve the level of your blood pressure and reach or maintain a healthy weight.  HOW CAN FOOD AFFECT ME? Carbohydrates Carbohydrates affect your blood glucose level more than any other type of food. Your dietitian will help you determine how many carbohydrates to eat at each meal and teach you how to count carbohydrates. Counting carbohydrates is important to keep your blood glucose at a healthy level, especially if you are using insulin or taking certain medicines for diabetes mellitus. Alcohol Alcohol can cause sudden decreases in blood glucose (hypoglycemia), especially if you use insulin or take certain medicines for diabetes mellitus. Hypoglycemia can be a life-threatening condition. Symptoms of hypoglycemia (sleepiness, dizziness, and disorientation) are similar to symptoms of having too much alcohol.  If your health care provider has given you approval to drink alcohol, do so in moderation and use the following guidelines:  Women should not have more than one drink per day, and men should not have more than two drinks per day. One drink is equal to:  12 oz of beer.  5 oz of wine.  1 oz of hard liquor.  Do not drink on an empty stomach.  Keep yourself hydrated. Have water, diet soda, or unsweetened iced tea.  Regular soda, juice, and other mixers might contain a lot of carbohydrates and should be counted. WHAT FOODS ARE NOT RECOMMENDED? As you make food choices, it is important to remember that all foods are not the same. Some foods have fewer nutrients per serving than other  foods, even though they might have the same number of calories or carbohydrates. It is difficult to get your body what it needs when you eat foods with fewer nutrients. Examples of foods that you should avoid that are high in calories and carbohydrates but low in nutrients include:  Trans fats (most processed foods list trans fats on the Nutrition Facts label).  Regular soda.  Juice.  Candy.  Sweets, such as cake, pie, doughnuts, and cookies.  Fried foods. WHAT FOODS CAN I EAT? Have nutrient-rich foods, which will nourish your body and keep you healthy. The food you should eat also will depend on several factors, including:  The calories you need.  The medicines you take.  Your weight.  Your blood glucose level.  Your blood pressure level.  Your cholesterol level. You also should eat a variety of foods, including:  Protein, such as meat, poultry, fish, tofu, nuts, and seeds (lean animal proteins are best).  Fruits.  Vegetables.  Dairy products, such as milk, cheese, and yogurt (low fat is best).  Breads, grains, pasta, cereal, rice, and beans.  Fats such as olive oil, trans fat-free margarine, canola oil, avocado, and olives. DOES EVERYONE WITH DIABETES MELLITUS HAVE THE SAME MEAL PLAN? Because every person with diabetes mellitus is different, there is not one meal plan that works for everyone. It is very important that you meet with a dietitian who will help you create a meal plan that is just right for you. Document Released: 10/23/2004 Document Revised: 01/31/2013 Document Reviewed: 12/23/2012 ExitCare Patient Information 2015 [employer]. This   information is not intended to replace advice given to you by your health care provider. Make sure you discuss any questions you have with your health care provider.  

## 2014-05-24 NOTE — Progress Notes (Signed)
Quick Note:  Labs addressed at office as well as medications and patient was made aware. ______ 
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
## 2014-05-25 ENCOUNTER — Ambulatory Visit: Payer: Self-pay | Admitting: Family Medicine

## 2014-05-28 ENCOUNTER — Other Ambulatory Visit: Payer: Self-pay

## 2014-05-28 ENCOUNTER — Encounter: Payer: Self-pay | Admitting: *Deleted

## 2014-05-28 ENCOUNTER — Ambulatory Visit: Payer: MEDICAID | Attending: Family Medicine

## 2014-05-28 DIAGNOSIS — E1129 Type 2 diabetes mellitus with other diabetic kidney complication: Secondary | ICD-10-CM

## 2014-05-28 LAB — LIPID PANEL
CHOLESTEROL: 119 mg/dL (ref 0–200)
HDL: 32 mg/dL — ABNORMAL LOW (ref >=46)
LDL CALC: 54 mg/dL (ref 0–99)
Total CHOL/HDL Ratio: 3.7 Ratio
Triglycerides: 166 mg/dL — ABNORMAL HIGH (ref <150)
VLDL: 33 mg/dL (ref 0–40)

## 2014-05-28 NOTE — Progress Notes (Signed)
Celene SkeenBelen Watkins present for interpreter services.  Patient was here for a lab visit and Celene SkeenBelen Watkins, interpreter asked if I could help patient with how to use her insulin pen.  Upon talking with patient, she was recently started on Novolog 70/30 flex pen and was given no diabetes education.  She states she has a home health RN coming but she is focusing on patient's blood pressure.  Spent time talking with patient about normal blood sugar readings and symptoms of hypo and hyper glycemia.  Patient does have a glucometer and is checking her sugars ACHS.  I instructed her to start a CBG log so that she can bring it to her next appointment so that we can see her ranges.    Patient had no idea how to use her insulin pen so I worked with her on how to administer.  She was able to give herself an injection and all questions were answered.  Patient was seen as a hospital follow up by Dr. Venetia NightAmao but has no follow up appointment.  Patient asking why she was started on insulin and not on oral medication.  Patient also asking for help to get a wheelchair.  I explained that these questions would be addressed at her establish care appointment.  Patient needs to be seen ASAP with her permanent provider here.  I have asked Charlett LangoJamilla Pinder to help in getting the patient an appointment here within the next day or so.  I told patient's son, who speaks english, that I would call this afternoon with patient's appointment date and time.  He was in agreement.

## 2014-05-29 LAB — MICROALBUMIN / CREATININE URINE RATIO
Creatinine, Urine: 97.9 mg/dL
MICROALB/CREAT RATIO: 21.5 mg/g (ref 0.0–30.0)
Microalb, Ur: 2.1 mg/dL — ABNORMAL HIGH (ref <2.0)

## 2014-05-30 ENCOUNTER — Telehealth: Payer: Self-pay

## 2014-05-30 NOTE — Telephone Encounter (Signed)
-----   Message from Jaclyn ShaggyEnobong Amao, MD sent at 05/30/2014 10:39 AM EDT ----- Cholesterol is normal except for mildly elevated triglycerides and I would like her to work on increasing physical activity, omega-3 capsules could also help. Her kidneys also spilling some protein and she is on lisinopril which should help with this.

## 2014-06-01 NOTE — Telephone Encounter (Signed)
-----   Message from Enobong Amao, MD sent at 05/30/2014 10:39 AM EDT ----- Cholesterol is normal except for mildly elevated triglycerides and I would like her to work on increasing physical activity, omega-3 capsules could also help. Her kidneys also spilling some protein and she is on lisinopril which should help with this. 

## 2014-06-01 NOTE — Telephone Encounter (Signed)
Nurse called patient via interpretor, Margaret Pace. Patient verified date of birth. Patient aware of normal cholesterol except mildly elevated triglycerides. Patient has problem with foot and can not exercise at this time. Patient aware of kidneys spilling some protein and lisinopril will help. Patient understands to start taking O[place]-3 capsules and reassures nurse she is taking lisinopril as directed.

## 2014-06-05 ENCOUNTER — Ambulatory Visit: Payer: Self-pay | Admitting: Internal Medicine

## 2014-06-06 ENCOUNTER — Encounter: Payer: Self-pay | Admitting: Internal Medicine

## 2014-06-06 ENCOUNTER — Ambulatory Visit: Payer: Self-pay | Attending: Internal Medicine | Admitting: Internal Medicine

## 2014-06-06 VITALS — BP 103/70 | HR 81 | Temp 98.0°F | Resp 16

## 2014-06-06 DIAGNOSIS — E119 Type 2 diabetes mellitus without complications: Secondary | ICD-10-CM

## 2014-06-06 DIAGNOSIS — R112 Nausea with vomiting, unspecified: Secondary | ICD-10-CM

## 2014-06-06 DIAGNOSIS — Z794 Long term (current) use of insulin: Secondary | ICD-10-CM | POA: Insufficient documentation

## 2014-06-06 LAB — POCT URINALYSIS DIPSTICK
GLUCOSE UA: NEGATIVE
KETONES UA: 15
Leukocytes, UA: NEGATIVE
Nitrite, UA: NEGATIVE
PH UA: 7.5
Protein, UA: 100
RBC UA: NEGATIVE
SPEC GRAV UA: 1.02
Urobilinogen, UA: 4

## 2014-06-06 LAB — GLUCOSE, POCT (MANUAL RESULT ENTRY): POC Glucose: 158 mg/dl — AB (ref 70–99)

## 2014-06-06 MED ORDER — ONDANSETRON HCL 4 MG PO TABS
4.0000 mg | ORAL_TABLET | Freq: Once | ORAL | Status: AC
  Administered 2014-06-06: 4 mg via ORAL

## 2014-06-06 MED ORDER — METFORMIN HCL 500 MG PO TABS: ORAL_TABLET | ORAL | Status: DC

## 2014-06-06 MED ORDER — ONDANSETRON 4 MG PO TBDP: 4.0000 mg | ORAL_TABLET | Freq: Three times a day (TID) | ORAL | Status: DC | PRN

## 2014-06-06 NOTE — Progress Notes (Signed)
Pt is here following up on her HTN and her new diagnosis of diabetes.

## 2014-06-06 NOTE — Patient Instructions (Addendum)
Take Lisinopril (BP medication) at bedtime to help with dizziness     Take one Metformin pill in the morning and one with dinner to help with upset stomach    I have sent nausea medication (ondansetron). May take every 8 hours as needed for nausea and vomiting      Diabetes y normas bsicas de atencin mdica (Diabetes and Standards of Medical Care) La diabetes es una enfermedad complicada. El equipo que trate su diabetes deber incluir un nutricionista, un enfermero, un educador para la diabetes, un oftalmlogo y ms. Para que todas las personas conozcan sobre su enfermedad y para que los pacientes tengan los cuidados que necesitan, se crearon las siguientes normas bsicas para un mejor control. A continuacin se indican los estudios, vacunas, medicamentos, educacin y planes que necesitar. Prueba de HbA1c Esta prueba muestra cmo ha sido controlada su glucosa en los ltimos 2 o 3 meses. Se utiliza para verificar si el plan de control de la diabetes debe ser ajustado.   Hgalos al menos 2 veces al ao si cumple los objetivos del [place].  Si le han cambiado el tratamiento o si no cumple con los objetivos del [place], debe hacerlo 4 veces al ao. Control de la presin arterial.  Hgalas en cada visita mdica de rutina. El objetivo es tener menos de 140/90 mm Hg en la mayora de las personas, pero 130/80 mm Hg en algunos casos. Consulte a su mdico acerca de su objetivo. Examen dental.  Concurra regularmente a las visitas de control con el dentista. Examen ocular.  Si le diagnosticaron diabetes tipo 1 siendo un nio, debe hacerse estudios al llegar a los 10 aos o ms y si ha sufrido de diabetes durante 3 a 5 aos. Se recomienda hacer anualmente los exmenes oculares despus de ese examen inicial.  Si le diagnosticaron diabetes tipo 1 siendo adulto, hgase un examen dentro de los 5 aos del diagnstico y luego una vez por ao.  Si le diagnosticaron diabetes tipo 2, hgase  un estudio lo antes posible despus del diagnstico y luego una vez por ao. Examen de los pies  Se har una inspeccin visual en cada visita mdica de rutina. Estos controles observarn si hay cortes, lesiones u otros problemas en los pies.  Una vez por ao debe hacerse un examen ms intensivo. Este examen incluye la inspeccin visual y Ardelia Mems evaluacin de los pulsos del pie y la sensibilidad.  Contrlese los pies todas las noches para ver si hay cortes, lesiones u otros problemas. Comunquese con su mdico si observa que no se curan. Estudio de la funcin renal (microalbmina en orina)  Debe realizarse una vez por ao.  Diabetes tipo 1: La primera prueba se realiza a los 5 aos despus del diagnstico.  Diabetes tipo2: La primera prueba se realiza en el momento del diagnstico.  La creatinina srica y el ndice de filtracin glomerular estimada (eGFR, por sus siglas en ingls) se realizan una vez por ao para informar el nivel de enfermedad renal crnica, si la hubiera. Perfil lipdico (colesterol, HDL, LDL, triglicridos).  La mayora de las personas lo hacen cada 5 aos.  En relacin al LDL, el objetivo es tener menos de 100 mg/dl. Si tiene alto riesgo, el objetivo es tener menos de 70 mg/dl.  En relacin al HDL, el objetivo es [employer] 40 y 10 mg/dl para los hombres y entre 45 y 64 mg/dl para las mujeres. Un nivel de colesterol HDL de 60 mg/dl o superior da [employer]  proteccin contra la enfermedad cardaca.  En relacin a los triglicridos, el objetivo es tener menos de 150 mg/dl. Vacuna contra la gripe, contra el neumococo y contra la hepatitis B  Se recomienda aplicarse la vacuna contra la gripe anualmente.  Se recomienda que las [employer] de 65aos con diabetes se apliquen la vacuna contra la neumona. En algunos casos, pueden administrarse dos inyecciones separadas. Pregntele al mdico si tiene la vacuna contra la neumona al da.  Tambin se recomienda la  aplicacin de la vacuna contra la hepatitis B en los adultos con diabetes. Educacin para el autocontrol de la diabetes  Recomendaciones al momento del diagnstico y los controles segn sea necesario. Plan de tratamiento  Su plan de tratamiento ser revisado en cada visita mdica. Document Released: 04/22/2009 Document Revised: 06/12/2013 [facility] Patient Information 2015 [place]. This information is not intended to replace advice given to you by your health care provider. Make sure you discuss any questions you have with your health care provider.
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
## 2014-06-11 ENCOUNTER — Telehealth: Payer: Self-pay | Admitting: *Deleted

## 2014-06-11 NOTE — Telephone Encounter (Signed)
Please call patient and follow up on her her blood sugars are doing since decreasing Metformin (have her to read you numbers). Find out if she is still dizzy and did it help after switching the Lisinopril to bedtime. Thanks         [employer] IncSpanish Interpreter Carlos [phone]#[id number]  Left HIPPA compliant message on voicemail

## 2014-06-13 ENCOUNTER — Other Ambulatory Visit: Payer: Self-pay | Admitting: Internal Medicine

## 2014-06-13 ENCOUNTER — Encounter: Payer: Self-pay | Attending: Orthopedic Surgery | Admitting: *Deleted

## 2014-06-13 ENCOUNTER — Encounter: Payer: Self-pay | Admitting: *Deleted

## 2014-06-13 ENCOUNTER — Telehealth: Payer: Self-pay | Admitting: *Deleted

## 2014-06-13 VITALS — Ht 63.0 in | Wt 160.0 lb

## 2014-06-13 DIAGNOSIS — E1165 Type 2 diabetes mellitus with hyperglycemia: Secondary | ICD-10-CM | POA: Insufficient documentation

## 2014-06-13 DIAGNOSIS — Z713 Dietary counseling and surveillance: Secondary | ICD-10-CM | POA: Insufficient documentation

## 2014-06-13 DIAGNOSIS — Z794 Long term (current) use of insulin: Secondary | ICD-10-CM | POA: Insufficient documentation

## 2014-06-13 NOTE — Progress Notes (Signed)
Diabetes Self-Management Education  Visit Type:  Initial DSMe  Appt. Start Time: 1100 Appt. End Time: 1230  06/13/2014  Ms. Margaret Pace, identified by name and date of birth, is a 49 y.o. female with a diagnosis of Diabetes: Type 2.  Other people present during visit:  Patient, Interpreter . Patient is presently taking 70/30 BID. She takes it at different times of day depending on her glucose readings.  After review of patient glucose readings  I contacted [facility] and spoke with Margaret HatchAnn, RN Triage. I suggested that we may want to decrease her insulin dose. After further discussion with provider Margaret Pace(Margaret Keck, NP) the recommendation was made that the patient discontinue the insulin immediately and increase the Metformin to 1000mg  BID with meals. Patient was agreeable.   ASSESSMENT  Height 5\' 3"  (1.6 m), weight 160 lb (72.576 kg), last menstrual period 11/12/2013. Body mass index is 28.35 kg/(m^2).  Initial Visit Information:  Are you currently following a meal plan?: No Are you taking your medications as prescribed?: Yes Are you checking your feet?: Yes How often do you need to have someone help you when you read instructions, pamphlets, or other written materials from your doctor or pharmacy?: 4 - Often (does not speak/read english)   Psychosocial:   Patient Belief/Attitude about Diabetes: Motivated to manage diabetes Self-care barriers: English as a second language (does not speak english) Self-management support: Doctor's office, Family, CDE visits Other persons present: Patient, Interpreter Patient Concerns: Nutrition/Meal planning, Medication, Glycemic Control Special Needs: Other (comment) (written materials in spanish) Preferred Learning Style: No preference indicated Learning Readiness: Ready  Complications:   Last HgB A1C per patient/outside source: 8.9 mg/dL How often do you check your blood sugar?: 3-4 times/day Fasting Blood glucose range (mg/dL):  [phone], [phone]  Diet Intake:  Breakfast: 1 slice whole wheat bread , peanut butter, water / coffee, whole wheat bread peanut butter, apple, coffee, cream splenda. (0800) Lunch: boiled tender cactus (vegetable), fried potatoes, zero calories jello (1200) Snack (afternoon): boiled tender cactus (vegetable) fried potatoes, orange  (1500) Dinner: fish, lettuce, tomatoe , soda (1900) Beverage(s): water, coffee/splenda, diet soda  Exercise:  Exercise: ADL's (Patient recently broke foot, on crutches)  Individualized Plan for Diabetes Self-Management Training:   Learning Objective:  Patient will have a greater understanding of diabetes self-management. Patient education plan per assessed needs and concerns is to attend individual sessions     Education Topics Reviewed with Patient Today:   Role of diet in the treatment of diabetes and the relationship between the three main macronutrients and blood glucose level Reviewed patients medication for diabetes, action, purpose, timing of dose and side effects.  PATIENTS GOALS/Plan (Developed by the patient):  Nutrition: General guidelines for healthy choices and portions discussed Medications: take my medication as prescribed (discontinue insulin, increase metformin to 10000mg  BID)  Plan:   Patient Instructions  Eat a good breakfast, lunch and dinner that has carbohydrates and protein. Have a snack at 3:00 with carbohydrate and protein. Test your sugar where the yellow lines are. Discontinue your insulin. Begin taking metformin 2 tablets in the morning 8:00 with breakfast and 2 tablets at 7:00 with dinner  OK to have torilla with rice and beans, small 4 inch.  [facility]    Expected Outcomes:  Demonstrated interest in learning. Expect positive outcomes  Education material provided: Living Well with Diabetes, Meal plan card and My Plate Spanish Version  If problems or questions, patient to  contact team via:  Phone  Future DSME appointment: 4-6 wks (appt 06/26/14 @ 5:00) to focus on nutrition

## 2014-06-13 NOTE — Telephone Encounter (Signed)
Margaret Pace with [facility] called to say patient had not been taking her insulin and her CBG readings first thing in the morning were under 160 but by lunch time they were elevated above 160.  Patient reports that she has been taking her metformin 500 mg bid as prescribed and having no side effects.  Spoke to PCP who is fine with patient stopping the insulin and resuming the metformin 1000 mg bid.  Patient reports she had gastrointestinal discomfort only when the hospital added the insulin on top of her taking the Metformin 100 mg bid.  She states before the insulin was added she was tolerating the metformin 1000 mg bid.   Margaret Pace agreed with plan and will pass on plan to patient.  Diabetes educator was transferred to the front desk to make an appointment for the patient to see our Pace educator in 2 weeks to evaluate these medication changes.

## 2014-06-13 NOTE — Telephone Encounter (Signed)
Margaret Pace with [facility] outpatient diabetes education called in because patient was stating medication instructions that were not documented in Epic by the provider.  Confirmed Ms. [name]'s instructions documented in Epic on 4/27 that patient is to take her Metformin 500mg  two times a day and take her insulin 70/30 8 units bid.  Ms. [name] wanted the patient to continue this regimen for 2-3 weeks and come back for a RN visit around 06/20/14 to review her CBG log and make adjustments as needed with the goal to eventually get the patient off of the insulin and have her on metformin only for her diabetes.   RN diabetes educator understood and said she would let the patient know that she needs to make an appointment with [name] for a CBG review.

## 2014-06-13 NOTE — Patient Instructions (Addendum)
Eat a good breakfast, lunch and dinner that has carbohydrates and protein. Have a snack at 3:00 with carbohydrate and protein. Test your sugar where the yellow lines are. Discontinue your insulin. Begin taking metformin 2 tablets in the morning 8:00 with breakfast and 2 tablets at 7:00 with dinner  OK to have torilla with rice and beans, small 4 inch.  [facility]

## 2014-06-26 ENCOUNTER — Ambulatory Visit: Payer: Self-pay | Admitting: *Deleted

## 2014-08-14 ENCOUNTER — Ambulatory Visit: Payer: Self-pay | Admitting: Internal Medicine

## 2014-08-15 ENCOUNTER — Encounter: Payer: Self-pay | Admitting: Internal Medicine

## 2014-08-15 ENCOUNTER — Ambulatory Visit: Payer: Self-pay | Attending: Internal Medicine | Admitting: Internal Medicine

## 2014-08-15 VITALS — BP 118/76 | HR 66 | Temp 98.4°F | Ht 62.0 in | Wt 151.0 lb

## 2014-08-15 DIAGNOSIS — E1165 Type 2 diabetes mellitus with hyperglycemia: Secondary | ICD-10-CM

## 2014-08-15 LAB — GLUCOSE, POCT (MANUAL RESULT ENTRY): POC GLUCOSE: 186 mg/dL — AB (ref 70–99)

## 2014-08-15 LAB — POCT GLYCOSYLATED HEMOGLOBIN (HGB A1C): Hemoglobin A1C: 8.8

## 2014-08-15 MED ORDER — METFORMIN HCL 1000 MG PO TABS: 1000.0000 mg | ORAL_TABLET | Freq: Two times a day (BID) | ORAL | Status: DC

## 2014-08-15 MED ORDER — DAPAGLIFLOZIN PROPANEDIOL 5 MG PO TABS: 5.0000 mg | ORAL_TABLET | Freq: Every day | ORAL | Status: DC

## 2014-08-15 NOTE — Progress Notes (Signed)
Margaret SkeenBelen Pace was present for interpretation Patient has not been taking any medications except for her metformin She claims she left all her medications in [place] on the 8th of June She does have a glucometer She states she does not take her insulin because a "nutritionist' told her not too because it made her throw  Up She has a 48 year old daughter that has recently had to start dialysis and she is very sad about that.

## 2014-08-15 NOTE — Progress Notes (Signed)
Patient ID: Margaret Pace, female   DOB: Jun 25, 1965, 49 y.o.   MRN: [id number] SUBJECTIVE: 49 y.o. female for follow up of diabetes. Diabetic Review of Systems - medication compliance: noncompliant much of the time, diabetic diet compliance: noncompliant some of the time, home glucose monitoring: is performed sporadically.  Other symptoms and concerns: Patient reports that she stop taking insulin several months ago because her nutritionist told her to stop taking it because she felt sick much of the time. She reports that she has been taking 1,000 mg of Metformin twice daily only.  Current Outpatient Prescriptions  Medication Sig Dispense Refill  . aspirin EC 325 MG tablet Take 1 tablet (325 mg total) by mouth daily. 60 tablet 0  . metFORMIN (GLUCOPHAGE) 500 MG tablet 1 tablets two times a day in the morning and evening (Patient taking differently: Take 1,000 mg by mouth 2 (two) times daily with a meal. 2 tablets two times a day in the morning and evening) 120 tablet 3  . blood glucose meter kit and supplies Dispense based on patient and insurance preference. Use up to four times daily as directed. (FOR ICD-9 250.00, 250.01). (Patient not taking: Reported on 05/24/2014) 1 each 1  . insulin starter kit- syringes MISC 1 kit by Other route once. (Patient not taking: Reported on 05/24/2014) 1 kit 0  . Insulin Syringes, Disposable, U-100 0.3 ML MISC Inject 8 Units into the skin 2 (two) times daily with a meal. (Patient not taking: Reported on 05/24/2014) 1 each 0  . lisinopril (PRINIVIL,ZESTRIL) 5 MG tablet Take 1 tablet (5 mg total) by mouth daily. (Patient not taking: Reported on 08/15/2014) 30 tablet 3  . Omega-3 Fatty Acids (FISH OIL) 1200 MG CAPS Take by mouth.    . oxyCODONE-acetaminophen (ROXICET) 5-325 MG per tablet Take 1-2 tablets by mouth every 6 (six) hours as needed for severe pain. (Patient not taking: Reported on 08/15/2014) 80 tablet 0   No current facility-administered medications for this visit.     OBJECTIVE: Appearance: alert, well appearing, and in no distress and oriented to person, place, and time. BP 118/76 mmHg  Pulse 66  Temp(Src) 98.4 F (36.9 C)  Ht _0  (1.575 m)  Wt 151 lb (68.493 kg)  BMI 27.61 kg/m2  SpO2 100%  LMP 11/12/2013  Exam: heart sounds normal rate, regular rhythm, normal S1, S2, no murmurs, rubs, clicks or gallops, no JVD, no carotid bruits.Left leg edema--previous surgery for broken tibia  ASSESSMENT: Diabetes Mellitus: needs improvement  PLAN: See orders for this visit as documented in the electronic medical record. Issues reviewed with her: diabetic diet discussed in detail, written exchange diet given, low cholesterol diet, weight control and daily exercise discussed, foot care discussed and Podiatry visits discussed, annual eye examinations at Ophthalmology discussed and long term diabetic complications discussed.  Due to language barrier, an interpreter was present during the history-taking and subsequent discussion (and for part of the physical exam) with this patient.  Return in about 3 months (around 11/15/2014) for Diabetes Mellitus.  Lance Bosch, NP 08/26/2014 4:53 PM

## 2014-08-26 ENCOUNTER — Encounter: Payer: Self-pay | Admitting: Internal Medicine

## 2014-10-18 ENCOUNTER — Other Ambulatory Visit (HOSPITAL_COMMUNITY): Payer: Self-pay | Admitting: *Deleted

## 2014-10-18 NOTE — Progress Notes (Signed)
Mrs Margaret Pace is not available and her son Margaret Pace is bringing Ms Margaret Pace to the hospital.  Through Spanish Interpreter Noreene Larsson, [place] # [phone], I instructed son for patient to arrive at the main entrance of [facility] at 11:00AM.   Patient should not eat or drink after midnight and she should not take any medications. I instructed son that patient will need an adult with her for the first 24 hours after surgery.  Son will try to have Ms Margaret Pace to call me back later today, if she becomes available.

## 2014-10-18 NOTE — Progress Notes (Deleted)
Nettie Elm from [facility]. And reported that they will not be able to transport patient here until 7:30 or 0800.  I notified Judeth Cornfield at Dr Adele Dan office.

## 2014-10-19 ENCOUNTER — Encounter (HOSPITAL_COMMUNITY)
Admission: RE | Disposition: A | Discharge status: Home / Self Care | Payer: Self-pay | Source: Ambulatory Visit | Attending: Orthopedic Surgery

## 2014-10-19 ENCOUNTER — Encounter (HOSPITAL_COMMUNITY): Payer: Self-pay | Admitting: Certified Registered Nurse Anesthetist

## 2014-10-19 ENCOUNTER — Ambulatory Visit (HOSPITAL_COMMUNITY): Payer: Self-pay

## 2014-10-19 ENCOUNTER — Ambulatory Visit (HOSPITAL_COMMUNITY): Payer: Self-pay | Admitting: Certified Registered Nurse Anesthetist

## 2014-10-19 ENCOUNTER — Ambulatory Visit (HOSPITAL_COMMUNITY)
Admission: RE | Admit: 2014-10-19 | Discharge: 2014-10-19 | Disposition: A | Discharge status: Home / Self Care | Payer: Self-pay | Source: Ambulatory Visit | Attending: Orthopedic Surgery | Admitting: Orthopedic Surgery

## 2014-10-19 DIAGNOSIS — E119 Type 2 diabetes mellitus without complications: Secondary | ICD-10-CM | POA: Insufficient documentation

## 2014-10-19 DIAGNOSIS — Z7982 Long term (current) use of aspirin: Secondary | ICD-10-CM | POA: Insufficient documentation

## 2014-10-19 DIAGNOSIS — S82202D Unspecified fracture of shaft of left tibia, subsequent encounter for closed fracture with routine healing: Secondary | ICD-10-CM | POA: Insufficient documentation

## 2014-10-19 DIAGNOSIS — Z419 Encounter for procedure for purposes other than remedying health state, unspecified: Secondary | ICD-10-CM

## 2014-10-19 DIAGNOSIS — Z794 Long term (current) use of insulin: Secondary | ICD-10-CM | POA: Insufficient documentation

## 2014-10-19 DIAGNOSIS — S8262XA Displaced fracture of lateral malleolus of left fibula, initial encounter for closed fracture: Secondary | ICD-10-CM | POA: Insufficient documentation

## 2014-10-19 HISTORY — DX: Anemia, unspecified: D64.9

## 2014-10-19 HISTORY — PX: HARDWARE REMOVAL: SHX979

## 2014-10-19 LAB — BASIC METABOLIC PANEL
ANION GAP: 6 (ref 5–15)
BUN: 15 mg/dL (ref 6–20)
CALCIUM: 9.1 mg/dL (ref 8.9–10.3)
CO2: 22 mmol/L (ref 22–32)
CREATININE: 0.62 mg/dL (ref 0.44–1.00)
Chloride: 108 mmol/L (ref 101–111)
Glucose, Bld: 109 mg/dL — ABNORMAL HIGH (ref 65–99)
Potassium: 3.8 mmol/L (ref 3.5–5.1)
Sodium: 136 mmol/L (ref 135–145)

## 2014-10-19 LAB — GLUCOSE, CAPILLARY
GLUCOSE-CAPILLARY: 83 mg/dL (ref 65–99)
Glucose-Capillary: 97 mg/dL (ref 65–99)
Glucose-Capillary: 85 mg/dL (ref 65–99)

## 2014-10-19 LAB — CBC
HCT: 37 % (ref 36.0–46.0)
HEMOGLOBIN: 12.4 g/dL (ref 12.0–15.0)
MCH: 29.2 pg (ref 26.0–34.0)
MCHC: 33.5 g/dL (ref 30.0–36.0)
MCV: 87.1 fL (ref 78.0–100.0)
PLATELETS: 173 10*3/uL (ref 150–400)
RBC: 4.25 MIL/uL (ref 3.87–5.11)
RDW: 14.4 % (ref 11.5–15.5)
WBC: 5.8 10*3/uL (ref 4.0–10.5)

## 2014-10-19 LAB — SURGICAL PCR SCREEN
MRSA, PCR: NEGATIVE
STAPHYLOCOCCUS AUREUS: POSITIVE — AB

## 2014-10-19 SURGERY — REMOVAL, HARDWARE
Anesthesia: General | Site: Ankle | Laterality: Left

## 2014-10-19 MED ORDER — LACTATED RINGERS IV SOLN
INTRAVENOUS | Status: DC
  Administered 2014-10-19: 13:00:00 via INTRAVENOUS

## 2014-10-19 MED ORDER — MIDAZOLAM HCL 2 MG/2ML IJ SOLN
INTRAMUSCULAR | Status: AC
  Filled 2014-10-19: qty 4

## 2014-10-19 MED ORDER — 0.9 % SODIUM CHLORIDE (POUR BTL) OPTIME
TOPICAL | Status: DC | PRN
  Administered 2014-10-19: 1000 mL

## 2014-10-19 MED ORDER — CEFAZOLIN SODIUM-DEXTROSE 2-3 GM-% IV SOLR
2.0000 g | INTRAVENOUS | Status: AC
  Administered 2014-10-19: 2 g via INTRAVENOUS

## 2014-10-19 MED ORDER — BUPIVACAINE-EPINEPHRINE (PF) 0.5% -1:200000 IJ SOLN
INTRAMUSCULAR | Status: AC
  Filled 2014-10-19: qty 30

## 2014-10-19 MED ORDER — LACTATED RINGERS IV SOLN: INTRAVENOUS | Status: DC

## 2014-10-19 MED ORDER — HYDROCODONE-ACETAMINOPHEN 5-325 MG PO TABS: 1.0000 | ORAL_TABLET | Freq: Three times a day (TID) | ORAL | Status: DC | PRN

## 2014-10-19 MED ORDER — PROMETHAZINE HCL 25 MG/ML IJ SOLN: 6.2500 mg | INTRAMUSCULAR | Status: DC | PRN

## 2014-10-19 MED ORDER — KETOROLAC TROMETHAMINE 30 MG/ML IJ SOLN
INTRAMUSCULAR | Status: AC
  Filled 2014-10-19: qty 1

## 2014-10-19 MED ORDER — CEFAZOLIN SODIUM-DEXTROSE 2-3 GM-% IV SOLR
INTRAVENOUS | Status: AC
  Filled 2014-10-19: qty 50

## 2014-10-19 MED ORDER — BUPIVACAINE-EPINEPHRINE 0.5% -1:200000 IJ SOLN
INTRAMUSCULAR | Status: DC | PRN
  Administered 2014-10-19: 7 mL

## 2014-10-19 MED ORDER — PROMETHAZINE HCL 12.5 MG PO TABS: 12.5000 mg | ORAL_TABLET | Freq: Four times a day (QID) | ORAL | Status: DC | PRN

## 2014-10-19 MED ORDER — LIDOCAINE HCL (CARDIAC) 20 MG/ML IV SOLN
INTRAVENOUS | Status: AC
  Filled 2014-10-19: qty 5

## 2014-10-19 MED ORDER — PROPOFOL 10 MG/ML IV BOLUS
INTRAVENOUS | Status: AC
  Filled 2014-10-19: qty 20

## 2014-10-19 MED ORDER — KETOROLAC TROMETHAMINE 10 MG PO TABS: 10.0000 mg | ORAL_TABLET | Freq: Four times a day (QID) | ORAL | Status: DC | PRN

## 2014-10-19 MED ORDER — FENTANYL CITRATE (PF) 250 MCG/5ML IJ SOLN
INTRAMUSCULAR | Status: AC
  Filled 2014-10-19: qty 5

## 2014-10-19 MED ORDER — LIDOCAINE HCL (CARDIAC) 20 MG/ML IV SOLN
INTRAVENOUS | Status: DC | PRN
  Administered 2014-10-19: 100 mg via INTRAVENOUS

## 2014-10-19 MED ORDER — CHLORHEXIDINE GLUCONATE 4 % EX LIQD: 60.0000 mL | Freq: Once | CUTANEOUS | Status: DC

## 2014-10-19 MED ORDER — SUCCINYLCHOLINE CHLORIDE 20 MG/ML IJ SOLN
INTRAMUSCULAR | Status: DC | PRN
  Administered 2014-10-19: 100 mg via INTRAVENOUS

## 2014-10-19 MED ORDER — MIDAZOLAM HCL 5 MG/5ML IJ SOLN
INTRAMUSCULAR | Status: DC | PRN
  Administered 2014-10-19: 2 mg via INTRAVENOUS

## 2014-10-19 MED ORDER — HYDROMORPHONE HCL 1 MG/ML IJ SOLN: 0.2500 mg | INTRAMUSCULAR | Status: DC | PRN

## 2014-10-19 MED ORDER — EPHEDRINE SULFATE 50 MG/ML IJ SOLN
INTRAMUSCULAR | Status: DC | PRN
  Administered 2014-10-19: 10 mg via INTRAVENOUS

## 2014-10-19 MED ORDER — MUPIROCIN 2 % EX OINT
1.0000 "application " | TOPICAL_OINTMENT | Freq: Once | CUTANEOUS | Status: AC
  Administered 2014-10-19: 1 via TOPICAL
  Filled 2014-10-19: qty 22

## 2014-10-19 MED ORDER — FENTANYL CITRATE (PF) 100 MCG/2ML IJ SOLN
INTRAMUSCULAR | Status: DC | PRN
  Administered 2014-10-19: 75 ug via INTRAVENOUS

## 2014-10-19 MED ORDER — ONDANSETRON HCL 4 MG/2ML IJ SOLN
INTRAMUSCULAR | Status: DC | PRN
  Administered 2014-10-19: 4 mg via INTRAVENOUS

## 2014-10-19 MED ORDER — KETOROLAC TROMETHAMINE 30 MG/ML IJ SOLN
INTRAMUSCULAR | Status: DC | PRN
  Administered 2014-10-19: 30 mg via INTRAVENOUS

## 2014-10-19 MED ORDER — PROPOFOL 10 MG/ML IV BOLUS
INTRAVENOUS | Status: DC | PRN
  Administered 2014-10-19: 160 mg via INTRAVENOUS

## 2014-10-19 SURGICAL SUPPLY — 61 items
BANDAGE ELASTIC 4 VELCRO ST LF (GAUZE/BANDAGES/DRESSINGS) ×3 IMPLANT
BANDAGE ELASTIC 6 VELCRO ST LF (GAUZE/BANDAGES/DRESSINGS) ×3 IMPLANT
BANDAGE ESMARK 6X9 LF (GAUZE/BANDAGES/DRESSINGS) ×1 IMPLANT
BNDG CMPR 9X6 STRL LF SNTH (GAUZE/BANDAGES/DRESSINGS) ×1
BNDG COHESIVE 6X5 TAN STRL LF (GAUZE/BANDAGES/DRESSINGS) ×3 IMPLANT
BNDG ESMARK 6X9 LF (GAUZE/BANDAGES/DRESSINGS) ×3
BNDG GAUZE ELAST 4 BULKY (GAUZE/BANDAGES/DRESSINGS) ×2 IMPLANT
BRUSH SCRUB DISP (MISCELLANEOUS) ×6 IMPLANT
CLEANER TIP ELECTROSURG 2X2 (MISCELLANEOUS) ×3 IMPLANT
CLOSURE WOUND 1/2 X4 (GAUZE/BANDAGES/DRESSINGS)
COVER SURGICAL LIGHT HANDLE (MISCELLANEOUS) ×4 IMPLANT
CUFF TOURNIQUET SINGLE 18IN (TOURNIQUET CUFF) IMPLANT
CUFF TOURNIQUET SINGLE 24IN (TOURNIQUET CUFF) IMPLANT
CUFF TOURNIQUET SINGLE 34IN LL (TOURNIQUET CUFF) IMPLANT
DRAPE C-ARM 42X72 X-RAY (DRAPES) ×2 IMPLANT
DRAPE C-ARMOR (DRAPES) ×3 IMPLANT
DRAPE OEC MINIVIEW 54X84 (DRAPES) ×1 IMPLANT
DRAPE U-SHAPE 47X51 STRL (DRAPES) ×3 IMPLANT
DRSG ADAPTIC 3X8 NADH LF (GAUZE/BANDAGES/DRESSINGS) ×3 IMPLANT
DRSG PAD ABDOMINAL 8X10 ST (GAUZE/BANDAGES/DRESSINGS) ×2 IMPLANT
ELECT REM PT RETURN 9FT ADLT (ELECTROSURGICAL) ×3
ELECTRODE REM PT RTRN 9FT ADLT (ELECTROSURGICAL) ×1 IMPLANT
EVACUATOR 1/8 PVC DRAIN (DRAIN) IMPLANT
GAUZE SPONGE 4X4 12PLY STRL (GAUZE/BANDAGES/DRESSINGS) ×3 IMPLANT
GLOVE BIO SURGEON STRL SZ7.5 (GLOVE) ×3 IMPLANT
GLOVE BIO SURGEON STRL SZ8 (GLOVE) ×3 IMPLANT
GLOVE BIOGEL PI IND STRL 7.5 (GLOVE) ×1 IMPLANT
GLOVE BIOGEL PI IND STRL 8 (GLOVE) ×1 IMPLANT
GLOVE BIOGEL PI INDICATOR 7.5 (GLOVE) ×2
GLOVE BIOGEL PI INDICATOR 8 (GLOVE) ×2
GOWN STRL REUS W/ TWL LRG LVL3 (GOWN DISPOSABLE) ×2 IMPLANT
GOWN STRL REUS W/ TWL XL LVL3 (GOWN DISPOSABLE) ×1 IMPLANT
GOWN STRL REUS W/TWL LRG LVL3 (GOWN DISPOSABLE) ×6
GOWN STRL REUS W/TWL XL LVL3 (GOWN DISPOSABLE) ×3
KIT BASIN OR (CUSTOM PROCEDURE TRAY) ×3 IMPLANT
KIT ROOM TURNOVER OR (KITS) ×3 IMPLANT
MANIFOLD NEPTUNE II (INSTRUMENTS) ×3 IMPLANT
NEEDLE 22X1 1/2 (OR ONLY) (NEEDLE) ×2 IMPLANT
NS IRRIG 1000ML POUR BTL (IV SOLUTION) ×3 IMPLANT
PACK ORTHO EXTREMITY (CUSTOM PROCEDURE TRAY) ×3 IMPLANT
PAD ARMBOARD 7.5X6 YLW CONV (MISCELLANEOUS) ×4 IMPLANT
PADDING CAST COTTON 6X4 STRL (CAST SUPPLIES) ×3 IMPLANT
SPONGE LAP 18X18 X RAY DECT (DISPOSABLE) ×1 IMPLANT
SPONGE SCRUB IODOPHOR (GAUZE/BANDAGES/DRESSINGS) ×3 IMPLANT
STAPLER VISISTAT 35W (STAPLE) ×2 IMPLANT
STOCKINETTE IMPERVIOUS LG (DRAPES) ×1 IMPLANT
STRIP CLOSURE SKIN 1/2X4 (GAUZE/BANDAGES/DRESSINGS) IMPLANT
SUCTION FRAZIER TIP 10 FR DISP (SUCTIONS) ×2 IMPLANT
SUT ETHILON 3 0 PS 1 (SUTURE) ×2 IMPLANT
SUT PDS AB 2-0 CT1 27 (SUTURE) IMPLANT
SUT VIC AB 0 CT1 27 (SUTURE)
SUT VIC AB 0 CT1 27XBRD ANBCTR (SUTURE) IMPLANT
SUT VIC AB 2-0 CT1 27 (SUTURE)
SUT VIC AB 2-0 CT1 TAPERPNT 27 (SUTURE) IMPLANT
SYR CONTROL 10ML LL (SYRINGE) ×2 IMPLANT
TOWEL OR 17X24 6PK STRL BLUE (TOWEL DISPOSABLE) ×6 IMPLANT
TOWEL OR 17X26 10 PK STRL BLUE (TOWEL DISPOSABLE) ×6 IMPLANT
TUBE CONNECTING 12'X1/4 (SUCTIONS) ×1
TUBE CONNECTING 12X1/4 (SUCTIONS) ×2 IMPLANT
UNDERPAD 30X30 INCONTINENT (UNDERPADS AND DIAPERS) ×3 IMPLANT
YANKAUER SUCT BULB TIP NO VENT (SUCTIONS) ×3 IMPLANT
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
## 2014-10-19 NOTE — Anesthesia Procedure Notes (Signed)
Procedure Name: Intubation Date/Time: 10/19/2014 2:50 PM Performed by: Leonel Ramsay Pre-anesthesia Checklist: Patient identified, Emergency Drugs available, Suction available, Patient being monitored and Timeout performed Patient Re-evaluated:Patient Re-evaluated prior to inductionOxygen Delivery Method: Circle system utilized Preoxygenation: Pre-oxygenation with 100% oxygen Intubation Type: IV induction Ventilation: Mask ventilation without difficulty Laryngoscope Size: Mac and 3 Grade View: Grade I Tube type: Oral Tube size: 7.5 mm Number of attempts: 1 Airway Equipment and Method: Stylet Placement Confirmation: ETT inserted through vocal cords under direct vision,  positive ETCO2 and breath sounds checked- equal and bilateral Secured at: 22 cm Tube secured with: Tape Dental Injury: Teeth and Oropharynx as per pre-operative assessment

## 2014-10-19 NOTE — Progress Notes (Signed)
Pt reports (throught spanish interpreter) that her last menstruation was 2 years ago.

## 2014-10-19 NOTE — Transfer of Care (Signed)
Immediate Anesthesia Transfer of Care Note  Patient: Margaret Pace  Procedure(s) Performed: Procedure(s): HARDWARE REMOVAL LEFT TIBIA AND ANKLE (Left)  Patient Location: PACU  Anesthesia Type:General  Level of Consciousness: awake and alert   Airway & Oxygen Therapy: Patient Spontanous Breathing and Patient connected to nasal cannula oxygen  Post-op Assessment: Report given to RN and Post -op Vital signs reviewed and stable  Post vital signs: Reviewed and stable  Last Vitals:  Filed Vitals:   10/19/14 1213  BP: 158/71  Pulse: 52  Temp: 36.1 C  Resp: 20    Complications: No apparent anesthesia complications

## 2014-10-19 NOTE — Anesthesia Postprocedure Evaluation (Signed)
  Anesthesia Post-op Note  Patient: Margaret Pace  Procedure(s) Performed: Procedure(s): HARDWARE REMOVAL LEFT TIBIA AND ANKLE (Left)  Patient Location: PACU  Anesthesia Type:General  Level of Consciousness: awake and alert   Airway and Oxygen Therapy: Patient Spontanous Breathing  Post-op Pain: mild  Post-op Assessment: Post-op Vital signs reviewed              Post-op Vital Signs: stable  Last Vitals:  Filed Vitals:   10/19/14 1700  BP: 148/61  Pulse: 55  Temp:   Resp:     Complications: No apparent anesthesia complications

## 2014-10-19 NOTE — Anesthesia Preprocedure Evaluation (Addendum)
Anesthesia Evaluation  Patient identified by MRN, date of birth, ID band Patient awake    Reviewed: Allergy & Precautions, NPO status , Patient's Chart, lab work & pertinent test results  Airway Mallampati: II  TM Distance: >3 FB Neck ROM: Full    Dental  (+) Missing, Partial Upper, Partial Lower, Poor Dentition, Dental Advisory Given   Pulmonary neg pulmonary ROS,    breath sounds clear to auscultation       Cardiovascular negative cardio ROS   Rhythm:Regular     Neuro/Psych    GI/Hepatic   Endo/Other  diabetes, Type 2  Renal/GU      Musculoskeletal   Abdominal   Peds  Hematology   Anesthesia Other Findings   Reproductive/Obstetrics                            Anesthesia Physical Anesthesia Plan  ASA: II  Anesthesia Plan: General   Post-op Pain Management:    Induction: Intravenous  Airway Management Planned: Oral ETT  Additional Equipment:   Intra-op Plan:   Post-operative Plan: Extubation in OR  Informed Consent: I have reviewed the patients History and Physical, chart, labs and discussed the procedure including the risks, benefits and alternatives for the proposed anesthesia with the patient or authorized representative who has indicated his/her understanding and acceptance.   Dental advisory given  Plan Discussed with: CRNA and Surgeon  Anesthesia Plan Comments:         Anesthesia Quick Evaluation

## 2014-10-19 NOTE — Brief Op Note (Signed)
10/19/2014  3:50 PM  PATIENT:  Margaret Pace  49 y.o. female  PRE-OPERATIVE DIAGNOSIS:   1. SYMPTOMATIC HARDWARE LEFT TIBIA 2. SYMPTOMATIC HARDWARE LEFT LATERAL MALLEOLUS  POST-OPERATIVE DIAGNOSIS:   1. SYMPTOMATIC HARDWARE LEFT TIBIA 2. SYMPTOMATIC HARDWARE LEFT LATERAL MALLEOLUS  PROCEDURE:  Procedure(s): 1. REMOVAL DEEP IMPLANT LEFT TIBIA 2. REMOVAL DEEP IMPLANT LEFT FIBULA  SURGEON:  Surgeon(s) and Role:    * Myrene Galas, MD - Primary  PHYSICIAN ASSISTANT: Montez Morita, PA-C  ANESTHESIA:   general  I/O:  Total I/O In: 700 [I.V.:700] Out: 5 [Blood:5]  SPECIMEN:  No Specimen  TOURNIQUET:  * No tourniquets in log *  DICTATION: .Other Dictation: Dictation Number [id number]

## 2014-10-19 NOTE — Progress Notes (Signed)
Montez Morita PA contacted . Verified dressing changes and when to RTO. Marland Kitchen Pt informed of these and instructed on mupricin ointment treatment for Positive Staph PCR
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
## 2014-10-22 ENCOUNTER — Encounter (HOSPITAL_COMMUNITY): Payer: Self-pay | Admitting: Orthopedic Surgery

## 2014-11-21 ENCOUNTER — Ambulatory Visit: Payer: MEDICAID | Attending: Internal Medicine | Admitting: Internal Medicine

## 2014-11-21 ENCOUNTER — Other Ambulatory Visit: Payer: Self-pay

## 2014-11-21 ENCOUNTER — Encounter: Payer: Self-pay | Admitting: Internal Medicine

## 2014-11-21 VITALS — BP 127/67 | HR 64 | Temp 98.8°F | Resp 16 | Ht 63.0 in | Wt 153.2 lb

## 2014-11-21 DIAGNOSIS — E119 Type 2 diabetes mellitus without complications: Secondary | ICD-10-CM

## 2014-11-21 DIAGNOSIS — Z23 Encounter for immunization: Secondary | ICD-10-CM

## 2014-11-21 LAB — POCT GLYCOSYLATED HEMOGLOBIN (HGB A1C): HEMOGLOBIN A1C: 8.4

## 2014-11-21 LAB — GLUCOSE, POCT (MANUAL RESULT ENTRY): POC Glucose: 120 mg/dl — AB (ref 70–99)

## 2014-11-21 MED ORDER — DAPAGLIFLOZIN PROPANEDIOL 5 MG PO TABS: 5.0000 mg | ORAL_TABLET | Freq: Every day | ORAL | Status: DC

## 2014-11-21 MED ORDER — METFORMIN HCL 1000 MG PO TABS: 1000.0000 mg | ORAL_TABLET | Freq: Two times a day (BID) | ORAL | Status: DC

## 2014-11-21 MED ORDER — DAPAGLIFLOZIN PROPANEDIOL 5 MG PO TABS: 10.0000 mg | ORAL_TABLET | Freq: Every day | ORAL | Status: DC

## 2014-11-21 NOTE — Progress Notes (Signed)
Patient ID: Margaret Pace, female   DOB: 07/29/1965, 49 y.o.   MRN: [id number] SUBJECTIVE: 49 y.o. female for follow up of diabetes. Diabetic Review of Systems - medication compliance: compliant all of the time, diabetic diet compliance: compliant most of the time, home glucose monitoring: is performed regularly, fasting values range 119-123, further diabetic ROS: no polyuria or polydipsia, no chest pain, dyspnea or TIA's, no numbness, tingling or pain in extremities, no unusual visual symptoms, no hypoglycemia, last eye exam approximately several years ago.    Current Outpatient Prescriptions  Medication Sig Dispense Refill  . dapagliflozin propanediol (FARXIGA) 5 MG TABS tablet Take 5 mg by mouth daily. 30 tablet 4  . metFORMIN (GLUCOPHAGE) 1000 MG tablet Take 1 tablet (1,000 mg total) by mouth 2 (two) times daily with a meal. 60 tablet 4  . HYDROcodone-acetaminophen (NORCO) 5-325 MG per tablet Take 1 tablet by mouth every 8 (eight) hours as needed for moderate pain. 50 tablet 0  . ketorolac (TORADOL) 10 MG tablet Take 1 tablet (10 mg total) by mouth every 6 (six) hours as needed for moderate pain. 20 tablet 0  . promethazine (PHENERGAN) 12.5 MG tablet Take 1-2 tablets (12.5-25 mg total) by mouth every 6 (six) hours as needed for nausea or vomiting. 30 tablet 0   No current facility-administered medications for this visit.    OBJECTIVE: Appearance: alert, well appearing, and in no distress, oriented to person, place, and time and normal appearing weight. BP 127/67 mmHg  Pulse 64  Temp(Src) 98.8 F (37.1 C)  Resp 16  Ht 5\' 3"  (1.6 m)  Wt 153 lb 3.2 oz (69.491 kg)  BMI 27.14 kg/m2  SpO2 100%  LMP 11/12/2013  Exam: heart sounds normal rate, regular rhythm, normal S1, S2, no murmurs, rubs, clicks or gallops, no JVD, chest clear, no carotid bruits, feet: warm, good capillary refill, no trophic changes or ulcerative lesions, normal DP and PT pulses, normal monofilament exam and normal sensory  exam  ASSESSMENT: Diabetes Mellitus: needs improvement and needs to follow diet more regularly  PLAN: See orders for this visit as documented in the electronic medical record. Issues reviewed with her: diabetic diet discussed in detail, written exchange diet given, low cholesterol diet, weight control and daily exercise discussed, foot care discussed and Podiatry visits discussed, annual eye examinations at Ophthalmology discussed and long term diabetic complications discussed.  Influenza vaccine received in office today.  Due to language barrier, an interpreter was present during the history-taking and subsequent discussion (and for part of the physical exam) with this patient.  Return in about 3 months (around 02/21/2015) for Diabetes Mellitus.  Ambrose FinlandValerie A Keck, NP 11/21/2014 11:07 AM

## 2014-11-21 NOTE — Patient Instructions (Signed)
La diabetes mellitus y los alimentos (Diabetes Mellitus and Food) Es importante que controle su nivel de azcar en la sangre (glucosa). El nivel de glucosa en sangre depende en gran medida de lo que usted come. Comer alimentos saludables en las cantidades adecuadas a lo largo del da, aproximadamente a la misma hora todos los das, lo ayudar a controlar su nivel de glucosa en sangre. Tambin puede ayudarlo a retrasar o evitar el empeoramiento de la diabetes mellitus. Comer de manera saludable incluso puede ayudarlo a mejorar el nivel de presin arterial y a alcanzar o mantener un peso saludable.  Entre las recomendaciones generales para alimentarse y cocinar los alimentos de forma saludable, se incluyen las siguientes:  Respetar las comidas principales y comer colaciones con regularidad. Evitar pasar largos perodos sin comer con el fin de perder peso.  Seguir una dieta que consista principalmente en alimentos de origen vegetal, como frutas, vegetales, frutos secos, legumbres y cereales integrales.  Utilizar mtodos de coccin a baja temperatura, como hornear, en lugar de mtodos de coccin a alta temperatura, como frer en abundante aceite. Trabaje con el nutricionista para aprender a usar la informacin nutricional de las etiquetas de los alimentos. CMO PUEDEN AFECTARME LOS ALIMENTOS? Carbohidratos Los carbohidratos afectan el nivel de glucosa en sangre ms que cualquier otro tipo de alimento. El nutricionista lo ayudar a determinar cuntos carbohidratos puede consumir en cada comida y ensearle a contarlos. El recuento de carbohidratos es importante para mantener la glucosa en sangre en un nivel saludable, en especial si utiliza insulina o toma determinados medicamentos para la diabetes mellitus. Alcohol El alcohol puede provocar disminuciones sbitas de la glucosa en sangre (hipoglucemia), en especial si utiliza insulina o toma determinados medicamentos para la diabetes mellitus. La  hipoglucemia es una afeccin que puede poner en peligro la vida. Los sntomas de la hipoglucemia (somnolencia, mareos y desorientacin) son similares a los sntomas de haber consumido mucho alcohol.  Si el mdico lo autoriza a beber alcohol, hgalo con moderacin y siga estas pautas:  Las mujeres no deben beber ms de un trago por da, y los hombres no deben beber ms de dos tragos por da. Un trago es igual a:  12 onzas (355 ml) de cerveza  5 onzas de vino (150 ml) de vino  1,5onzas (45ml) de bebidas espirituosas  No beba con el estmago vaco.  Mantngase hidratado. Beba agua, gaseosas dietticas o t helado sin azcar.  Las gaseosas comunes, los jugos y otros refrescos podran contener muchos carbohidratos y se deben contar. QU ALIMENTOS NO SE RECOMIENDAN? Cuando haga las elecciones de alimentos, es importante que recuerde que todos los alimentos son distintos. Algunos tienen menos nutrientes que otros por porcin, aunque podran tener la misma cantidad de caloras o carbohidratos. Es difcil darle al cuerpo lo que necesita cuando consume alimentos con menos nutrientes. Estos son algunos ejemplos de alimentos que debera evitar ya que contienen muchas caloras y carbohidratos, pero pocos nutrientes:  Grasas trans (la mayora de los alimentos procesados incluyen grasas trans en la etiqueta de Informacin nutricional).  Gaseosas comunes.  Jugos.  Caramelos.  Dulces, como tortas, pasteles, rosquillas y galletas.  Comidas fritas. QU ALIMENTOS PUEDO COMER? Consuma alimentos ricos en nutrientes, que nutrirn el cuerpo y lo mantendrn saludable. Los alimentos que debe comer tambin dependern de varios factores, como:  Las caloras que necesita.  Los medicamentos que toma.  Su peso.  El nivel de glucosa en sangre.  El nivel de presin arterial.  El nivel de colesterol.   Debe consumir una amplia variedad de alimentos, por ejemplo:  Protenas.  Cortes de carne  magros.  Protenas con bajo contenido de grasas saturadas, como pescado, clara de huevo y frijoles. Evite las carnes procesadas.  Frutas y vegetales.  Frutas y vegetales que pueden ayudar a controlar los niveles sanguneos de glucosa, como manzanas, mangos y batatas.  Productos lcteos.  Elija productos lcteos sin grasa o con bajo contenido de grasa, como leche, yogur y queso.  Cereales, panes, pastas y arroz.  Elija cereales integrales, como panes multicereales, avena en grano y arroz integral. Estos alimentos pueden ayudar a controlar la presin arterial.  Grasas.  Alimentos que contengan grasas saludables, como frutos secos, aguacate, aceite de oliva, aceite de canola y pescado. TODOS LOS QUE PADECEN DIABETES MELLITUS TIENEN EL MISMO PLAN DE COMIDAS? Dado que todas las personas que padecen diabetes mellitus son distintas, no hay un solo plan de comidas que funcione para todos. Es muy importante que se rena con un nutricionista que lo ayudar a crear un plan de comidas adecuado para usted.   Esta informacin no tiene como fin reemplazar el consejo del mdico. Asegrese de hacerle al mdico cualquier pregunta que tenga.   Document Released: 05/05/2007 Document Revised: 02/16/2014 Elsevier Interactive Patient Education 2016 [employer]  

## 2014-11-21 NOTE — Progress Notes (Signed)
Virtual interpreter used [name] Patient here for follow up on her diabetes and for medication refills

## 2015-02-27 ENCOUNTER — Encounter: Payer: Self-pay | Admitting: Internal Medicine

## 2015-02-27 ENCOUNTER — Ambulatory Visit: Payer: Self-pay | Attending: Internal Medicine | Admitting: Internal Medicine

## 2015-02-27 VITALS — BP 119/73 | HR 64 | Temp 98.0°F | Resp 18 | Ht 63.0 in | Wt 158.0 lb

## 2015-02-27 DIAGNOSIS — Z79899 Other long term (current) drug therapy: Secondary | ICD-10-CM | POA: Insufficient documentation

## 2015-02-27 DIAGNOSIS — M545 Low back pain, unspecified: Secondary | ICD-10-CM

## 2015-02-27 DIAGNOSIS — E119 Type 2 diabetes mellitus without complications: Secondary | ICD-10-CM | POA: Insufficient documentation

## 2015-02-27 DIAGNOSIS — Z7984 Long term (current) use of oral hypoglycemic drugs: Secondary | ICD-10-CM | POA: Insufficient documentation

## 2015-02-27 DIAGNOSIS — Z6828 Body mass index (BMI) 28.0-28.9, adult: Secondary | ICD-10-CM | POA: Insufficient documentation

## 2015-02-27 LAB — POCT GLYCOSYLATED HEMOGLOBIN (HGB A1C): HEMOGLOBIN A1C: 8.2

## 2015-02-27 LAB — POCT URINALYSIS DIPSTICK
Bilirubin, UA: NEGATIVE
Blood, UA: NEGATIVE
Glucose, UA: 500
KETONES UA: NEGATIVE
LEUKOCYTES UA: NEGATIVE
Nitrite, UA: NEGATIVE
PH UA: 5.5
PROTEIN UA: NEGATIVE
UROBILINOGEN UA: 0.2

## 2015-02-27 LAB — GLUCOSE, POCT (MANUAL RESULT ENTRY): POC Glucose: 135 mg/dl — AB (ref 70–99)

## 2015-02-27 MED ORDER — DAPAGLIFLOZIN PROPANEDIOL 5 MG PO TABS: 10.0000 mg | ORAL_TABLET | Freq: Every day | ORAL | Status: DC

## 2015-02-27 MED ORDER — NAPROXEN 500 MG PO TABS: 500.0000 mg | ORAL_TABLET | Freq: Two times a day (BID) | ORAL | Status: DC

## 2015-02-27 MED ORDER — METFORMIN HCL 1000 MG PO TABS: 1000.0000 mg | ORAL_TABLET | Freq: Two times a day (BID) | ORAL | Status: DC

## 2015-02-27 NOTE — Progress Notes (Signed)
Patient ID: Margaret Pace, female   DOB: [birth year], 50 y.o.   MRN: [id number] SUBJECTIVE: 50 y.o. female for follow up of diabetes. Diabetic Review of Systems - medication compliance: compliant all of the time, diabetic diet compliance: noncompliant some of the time, home glucose monitoring: is performed regularly, further diabetic ROS: no polyuria or polydipsia, no chest pain, dyspnea or TIA's, no numbness, tingling or pain in extremities, no unusual visual symptoms, no hypoglycemia.  Other symptoms and concerns: lower back pain for the past 3 days described as a sharp pain that radiates to her ovaries. She feels like "my ovaries are swollen". Pain is aggravated by bending and lifting and is continuous. No urinary symptoms. No nausea, vomiting, fever, chills. She is postmenopausal and denies vaginal discharge. She has only tried Tylenol without relief. Denies bowel or bladder dysfunction.  Current Outpatient Prescriptions  Medication Sig Dispense Refill  . dapagliflozin propanediol (FARXIGA) 5 MG TABS tablet Take 10 mg by mouth daily. 30 tablet 4  . metFORMIN (GLUCOPHAGE) 1000 MG tablet Take 1 tablet (1,000 mg total) by mouth 2 (two) times daily with a meal. 60 tablet 4  . HYDROcodone-acetaminophen (NORCO) 5-325 MG per tablet Take 1 tablet by mouth every 8 (eight) hours as needed for moderate pain. 50 tablet 0  . ketorolac (TORADOL) 10 MG tablet Take 1 tablet (10 mg total) by mouth every 6 (six) hours as needed for moderate pain. 20 tablet 0  . promethazine (PHENERGAN) 12.5 MG tablet Take 1-2 tablets (12.5-25 mg total) by mouth every 6 (six) hours as needed for nausea or vomiting. 30 tablet 0   No current facility-administered medications for this visit.  Review of Systems: Other than what is stated in HPI, all other systems are negative.   OBJECTIVE: Appearance: alert, well appearing, and in no distress, oriented to person, place, and time and overweight. BP 119/73 mmHg  Pulse 64  Temp(Src) 98 F  (36.7 C)  Resp 18  Ht  (1.6 m)  Wt 158 lb (71.668 kg)  BMI 28.00 kg/m2  SpO2 100%  LMP 11/12/2013  Physical Exam  Constitutional: She is oriented to person, place, and time.  Cardiovascular: Normal rate, regular rhythm and normal heart sounds.   Pulmonary/Chest: Effort normal and breath sounds normal.  Abdominal: Soft. Bowel sounds are normal. She exhibits no distension. There is no tenderness.  NO CVA tenderness  Musculoskeletal: She exhibits tenderness (right lower back).  Neurological: She is alert and oriented to person, place, and time.  Skin: Skin is warm and dry.    ASSESSMENT: Margaret Pace was seen today for back pain.  Diagnoses and all orders for this visit:  Type 2 diabetes mellitus without complication, without long-term current use of insulin (HCC) -     Glucose (CBG) -     HgB A1c -     metFORMIN (GLUCOPHAGE) 1000 MG tablet; Take 1 tablet (1,000 mg total) by mouth 2 (two) times daily with a meal. -     dapagliflozin propanediol (FARXIGA) 5 MG TABS tablet; Take 10 mg by mouth daily. Patient's A1C is still not at goal. I have added [place]. I have continued to stress diet, weight, and exercise with patient to help supplement medications.   Right-sided low back pain without sciatica -     Urinalysis Dipstick -     Begin naproxen (NAPROSYN) 500 MG tablet; Take 1 tablet (500 mg total) by mouth 2 (two) times daily with a meal.   PLAN: See orders for this visit  as documented in the electronic medical record. Issues reviewed with her: diabetic diet discussed in detail, written exchange diet given, low cholesterol diet, weight control and daily exercise discussed, foot care discussed and Podiatry visits discussed, annual eye examinations at Ophthalmology discussed and long term diabetic complications discussed.  Due to language barrier, an interpreter was present during the history-taking and subsequent discussion (and for part of the physical exam) with this  patient.  Return if symptoms worsen or fail to improve.  Ambrose Finland, NP 03/06/2015 9:14 AM

## 2015-02-27 NOTE — Progress Notes (Signed)
Virtual interpreter used Patient here for follow up on her diabetes Patient also complains of lower back pain that started about five days ago

## 2015-06-17 ENCOUNTER — Ambulatory Visit: Payer: Self-pay | Admitting: Internal Medicine

## 2015-08-05 ENCOUNTER — Ambulatory Visit: Payer: Self-pay | Admitting: Family Medicine

## 2015-09-02 ENCOUNTER — Ambulatory Visit: Payer: Self-pay | Admitting: Family Medicine

## 2015-09-25 ENCOUNTER — Encounter: Payer: Self-pay | Admitting: Family Medicine

## 2015-09-25 ENCOUNTER — Ambulatory Visit: Payer: Self-pay | Attending: Family Medicine | Admitting: Family Medicine

## 2015-09-25 ENCOUNTER — Other Ambulatory Visit: Payer: Self-pay | Admitting: Family Medicine

## 2015-09-25 VITALS — BP 121/73 | HR 65 | Temp 98.4°F | Ht 61.5 in | Wt 158.0 lb

## 2015-09-25 DIAGNOSIS — E119 Type 2 diabetes mellitus without complications: Secondary | ICD-10-CM | POA: Insufficient documentation

## 2015-09-25 DIAGNOSIS — M545 Low back pain, unspecified: Secondary | ICD-10-CM

## 2015-09-25 DIAGNOSIS — T799XXA Unspecified early complication of trauma, initial encounter: Secondary | ICD-10-CM | POA: Insufficient documentation

## 2015-09-25 DIAGNOSIS — Z7984 Long term (current) use of oral hypoglycemic drugs: Secondary | ICD-10-CM | POA: Insufficient documentation

## 2015-09-25 DIAGNOSIS — G5603 Carpal tunnel syndrome, bilateral upper limbs: Secondary | ICD-10-CM | POA: Insufficient documentation

## 2015-09-25 DIAGNOSIS — Z9889 Other specified postprocedural states: Secondary | ICD-10-CM | POA: Insufficient documentation

## 2015-09-25 DIAGNOSIS — Z79899 Other long term (current) drug therapy: Secondary | ICD-10-CM | POA: Insufficient documentation

## 2015-09-25 DIAGNOSIS — T22111A Burn of first degree of right forearm, initial encounter: Secondary | ICD-10-CM | POA: Insufficient documentation

## 2015-09-25 DIAGNOSIS — L538 Other specified erythematous conditions: Secondary | ICD-10-CM | POA: Insufficient documentation

## 2015-09-25 LAB — POCT GLYCOSYLATED HEMOGLOBIN (HGB A1C): Hemoglobin A1C: 9.4

## 2015-09-25 MED ORDER — NAPROXEN 500 MG PO TABS: 500.0000 mg | ORAL_TABLET | Freq: Two times a day (BID) | ORAL | 2 refills | Status: DC

## 2015-09-25 MED ORDER — TRUEPLUS LANCETS 28G MISC: 1.0000 | Freq: Three times a day (TID) | 12 refills | Status: DC

## 2015-09-25 MED ORDER — METFORMIN HCL 1000 MG PO TABS: 1000.0000 mg | ORAL_TABLET | Freq: Two times a day (BID) | ORAL | 4 refills | Status: DC

## 2015-09-25 MED ORDER — TRUE METRIX METER DEVI: 1.0000 | Freq: Three times a day (TID) | 0 refills | Status: AC

## 2015-09-25 MED ORDER — SILVER SULFADIAZINE 1 % EX CREA: 1.0000 "application " | TOPICAL_CREAM | Freq: Every day | CUTANEOUS | 0 refills | Status: DC

## 2015-09-25 MED ORDER — GLIPIZIDE 5 MG PO TABS: 5.0000 mg | ORAL_TABLET | Freq: Two times a day (BID) | ORAL | 3 refills | Status: DC

## 2015-09-25 MED ORDER — DAPAGLIFLOZIN PROPANEDIOL 5 MG PO TABS: 10.0000 mg | ORAL_TABLET | Freq: Every day | ORAL | 4 refills | Status: DC

## 2015-09-25 MED ORDER — GLUCOSE BLOOD VI STRP: ORAL_STRIP | 12 refills | Status: DC

## 2015-09-25 MED ORDER — DAPAGLIFLOZIN PROPANEDIOL 5 MG PO TABS: 5.0000 mg | ORAL_TABLET | Freq: Every day | ORAL | 4 refills | Status: DC

## 2015-09-25 NOTE — Progress Notes (Signed)
Lower back pain for 3 months Needs refills

## 2015-09-25 NOTE — Progress Notes (Signed)
Subjective:  Patient ID: Margaret Pace, female    DOB: [birth year]  Age: 50 y.o. MRN: [id number]  CC: Diabetes and Back Pain (lower)   HPI Margaret Pace 50 year old female with a history of type 2 diabetes mellitus (A1c 9.4 from today) who comes into the clinic complaining of low back pain which is worse at night and does not radiate down her extremities. At her last visit with the nurse practitioner 7 months ago she had complained of similar symptoms and uses OTC medications with no relief in symptoms. Denies history of trauma  She endorses compliance with medications but has not adhered to an exercise regimen or diabetic diet. She is not up-to-date on eye exams.  Complains of a burn on her right forearm for the last 2 days and has not applied any OTC creams to it.  Past Medical History:  Diagnosis Date  . Anemia    Pt reported blood transfusion after left leg fracture.  . Carpal tunnel syndrome, bilateral   . Diabetes mellitus without complication [facility])     Past Surgical History:  Procedure Laterality Date  . APPLICATION OF WOUND VAC Left 05/14/2014   Procedure: APPLICATION OF WOUND VAC;  Surgeon: Myrene Galas, MD;  Location: [facility] OR;  Service: Orthopedics;  Laterality: Left;  . FASCIOTOMY Left 05/14/2014   Procedure: FOUR COMPARTMENT FASCIOTOMY;  Surgeon: Myrene Galas, MD;  Location: [facility] OR;  Service: Orthopedics;  Laterality: Left;  . FRACTURE SURGERY    . HARDWARE REMOVAL Left 10/19/2014   Procedure: HARDWARE REMOVAL LEFT TIBIA AND ANKLE;  Surgeon: Myrene Galas, MD;  Location: [facility] OR;  Service: Orthopedics;  Laterality: Left;  . SECONDARY CLOSURE OF WOUND Left 05/17/2014   Procedure: WOUND CLOSURE LEFT LEG ;  Surgeon: Myrene Galas, MD;  Location: [facility] OR;  Service: Orthopedics;  Laterality: Left;  . TIBIA IM NAIL INSERTION Left 05/14/2014   Procedure: INTRAMEDULLARY (IM) NAIL TIBIAL;  Surgeon: Myrene Galas, MD;  Location: [facility] OR;  Service: Orthopedics;  Laterality: Left;    No Known  Allergies  No current outpatient prescriptions on file prior to visit.   No current facility-administered medications on file prior to visit.     Outpatient Medications Prior to Visit  Medication Sig Dispense Refill  . dapagliflozin propanediol (FARXIGA) 5 MG TABS tablet Take 10 mg by mouth daily. 30 tablet 4  . metFORMIN (GLUCOPHAGE) 1000 MG tablet Take 1 tablet (1,000 mg total) by mouth 2 (two) times daily with a meal. 60 tablet 4  . HYDROcodone-acetaminophen (NORCO) 5-325 MG per tablet Take 1 tablet by mouth every 8 (eight) hours as needed for moderate pain. (Patient not taking: Reported on 09/25/2015) 50 tablet 0  . ketorolac (TORADOL) 10 MG tablet Take 1 tablet (10 mg total) by mouth every 6 (six) hours as needed for moderate pain. (Patient not taking: Reported on 09/25/2015) 20 tablet 0  . naproxen (NAPROSYN) 500 MG tablet Take 1 tablet (500 mg total) by mouth 2 (two) times daily with a meal. (Patient not taking: Reported on 09/25/2015) 60 tablet 1  . promethazine (PHENERGAN) 12.5 MG tablet Take 1-2 tablets (12.5-25 mg total) by mouth every 6 (six) hours as needed for nausea or vomiting. (Patient not taking: Reported on 09/25/2015) 30 tablet 0   No facility-administered medications prior to visit.     ROS Review of Systems  Constitutional: Negative for activity change, appetite change and fatigue.  HENT: Negative for congestion, sinus pressure and sore throat.   Eyes: Negative for visual disturbance.  Respiratory: Negative for cough, chest tightness, shortness of breath and wheezing.   Cardiovascular: Negative for chest pain and palpitations.  Gastrointestinal: Negative for abdominal distention, abdominal pain and constipation.  Endocrine: Negative for polydipsia.  Genitourinary: Negative for dysuria and frequency.  Musculoskeletal: Negative for arthralgias and back pain.  Skin: Positive for wound ( right forearm burn). Negative for rash.  Neurological: Negative for tremors,  light-headedness and numbness.  Hematological: Does not bruise/bleed easily.  Psychiatric/Behavioral: Negative for agitation and behavioral problems.    Objective:  BP 121/73 (BP Location: Right Arm, Patient Position: Sitting, Cuff Size: Small)   Pulse 65   Temp 98.4 F (36.9 C) (Oral)   Ht 5' 1.5" (1.562 m)   Wt 158 lb (71.7 kg)   LMP 11/12/2013   SpO2 100%   BMI 29.37 kg/m   BP/Weight 09/25/2015 02/27/2015 11/21/2014  Systolic BP 121 119 127  Diastolic BP 73 73 67  Wt. (Lbs) 158 158 153.2  BMI 29.37 28 27.14     Physical Exam  Constitutional: She is oriented to person, place, and time. She appears well-developed and well-nourished.  Cardiovascular: Normal rate, normal heart sounds and intact distal pulses.   No murmur heard. Pulmonary/Chest: Effort normal and breath sounds normal. She has no wheezes. She has no rales. She exhibits no tenderness.  Abdominal: Soft. Bowel sounds are normal. She exhibits no distension and no mass. There is no tenderness.  Musculoskeletal: Normal range of motion.  Neurological: She is alert and oriented to person, place, and time.  Skin:  Right forearm first-degree burn     Lab Results  Component Value Date   HGBA1C 9.4 09/25/2015    Assessment & Plan:   1. Right-sided low back pain without sciatica Advised to apply heat - naproxen (NAPROSYN) 500 MG tablet; Take 1 tablet (500 mg total) by mouth 2 (two) times daily with a meal.  Dispense: 60 tablet; Refill: 2  2. Type 2 diabetes mellitus without complication, without long-term current use of insulin (HCC) Uncontrolled with A1c of 9.4 Glipizide added to regimen Compliance with diabetic diet, medications emphasized Foot exam performed today and she has been provided with community resources for annual eye exam as she has no medical coverage - metFORMIN (GLUCOPHAGE) 1000 MG tablet; Take 1 tablet (1,000 mg total) by mouth 2 (two) times daily with a meal.  Dispense: 60 tablet; Refill: 4 -  dapagliflozin propanediol (FARXIGA) 5 MG TABS tablet; Take 10 mg by mouth daily.  Dispense: 30 tablet; Refill: 4 - COMPLETE METABOLIC PANEL WITH GFR - Lipid panel - Microalbumin / creatinine urine ratio - glucose blood (TRUE METRIX BLOOD GLUCOSE TEST) test strip; Use three times daily  Dispense: 100 each; Refill: 12 - Blood Glucose Monitoring Suppl (TRUE METRIX METER) DEVI; 1 each by Does not apply route 3 (three) times daily before meals.  Dispense: 1 Device; Refill: 0 - TRUEPLUS LANCETS 28G MISC; 1 each by Does not apply route 3 (three) times daily before meals.  Dispense: 100 each; Refill: 12 - HgB A1c - glipiZIDE (GLUCOTROL) 5 MG tablet; Take 1 tablet (5 mg total) by mouth 2 (two) times daily before a meal.  Dispense: 60 tablet; Refill: 3  3. Burn erythema of right forearm, initial encounter - silver sulfADIAZINE (SILVADENE) 1 % cream; Apply 1 application topically daily.  Dispense: 50 g; Refill: 0   Meds ordered this encounter  Medications  . naproxen (NAPROSYN) 500 MG tablet    Sig: Take 1 tablet (500 mg total) by  mouth 2 (two) times daily with a meal.    Dispense:  60 tablet    Refill:  2  . metFORMIN (GLUCOPHAGE) 1000 MG tablet    Sig: Take 1 tablet (1,000 mg total) by mouth 2 (two) times daily with a meal.    Dispense:  60 tablet    Refill:  4  . dapagliflozin propanediol (FARXIGA) 5 MG TABS tablet    Sig: Take 10 mg by mouth daily.    Dispense:  30 tablet    Refill:  4  . glucose blood (TRUE METRIX BLOOD GLUCOSE TEST) test strip    Sig: Use three times daily    Dispense:  100 each    Refill:  12  . Blood Glucose Monitoring Suppl (TRUE METRIX METER) DEVI    Sig: 1 each by Does not apply route 3 (three) times daily before meals.    Dispense:  1 Device    Refill:  0  . TRUEPLUS LANCETS 28G MISC    Sig: 1 each by Does not apply route 3 (three) times daily before meals.    Dispense:  100 each    Refill:  12  . silver sulfADIAZINE (SILVADENE) 1 % cream    Sig: Apply 1  application topically daily.    Dispense:  50 g    Refill:  0  . glipiZIDE (GLUCOTROL) 5 MG tablet    Sig: Take 1 tablet (5 mg total) by mouth 2 (two) times daily before a meal.    Dispense:  60 tablet    Refill:  3    Follow-up: Return in about 3 months (around 12/26/2015) for follow up on Diabetes mellitus.   Jaclyn ShaggyEnobong Amao MD

## 2015-09-25 NOTE — Patient Instructions (Signed)
Diabetes Mellitus and Food It is important for you to manage your blood sugar (glucose) level. Your blood glucose level can be greatly affected by what you eat. Eating healthier foods in the appropriate amounts throughout the day at about the same time each day will help you control your blood glucose level. It can also help slow or prevent worsening of your diabetes mellitus. Healthy eating may even help you improve the level of your blood pressure and reach or maintain a healthy weight.  General recommendations for healthful eating and cooking habits include:  Eating meals and snacks regularly. Avoid going long periods of time without eating to lose weight.  Eating a diet that consists mainly of plant-based foods, such as fruits, vegetables, nuts, legumes, and whole grains.  Using low-heat cooking methods, such as baking, instead of high-heat cooking methods, such as deep frying. Work with your dietitian to make sure you understand how to use the Nutrition Facts information on food labels. HOW CAN FOOD AFFECT ME? Carbohydrates Carbohydrates affect your blood glucose level more than any other type of food. Your dietitian will help you determine how many carbohydrates to eat at each meal and teach you how to count carbohydrates. Counting carbohydrates is important to keep your blood glucose at a healthy level, especially if you are using insulin or taking certain medicines for diabetes mellitus. Alcohol Alcohol can cause sudden decreases in blood glucose (hypoglycemia), especially if you use insulin or take certain medicines for diabetes mellitus. Hypoglycemia can be a life-threatening condition. Symptoms of hypoglycemia (sleepiness, dizziness, and disorientation) are similar to symptoms of having too much alcohol.  If your health care provider has given you approval to drink alcohol, do so in moderation and use the following guidelines:  Women should not have more than one drink per day, and men  should not have more than two drinks per day. One drink is equal to:  12 oz of beer.  5 oz of wine.  1 oz of hard liquor.  Do not drink on an empty stomach.  Keep yourself hydrated. Have water, diet soda, or unsweetened iced tea.  Regular soda, juice, and other mixers might contain a lot of carbohydrates and should be counted. WHAT FOODS ARE NOT RECOMMENDED? As you make food choices, it is important to remember that all foods are not the same. Some foods have fewer nutrients per serving than other foods, even though they might have the same number of calories or carbohydrates. It is difficult to get your body what it needs when you eat foods with fewer nutrients. Examples of foods that you should avoid that are high in calories and carbohydrates but low in nutrients include:  Trans fats (most processed foods list trans fats on the Nutrition Facts label).  Regular soda.  Juice.  Candy.  Sweets, such as cake, pie, doughnuts, and cookies.  Fried foods. WHAT FOODS CAN I EAT? Eat nutrient-rich foods, which will nourish your body and keep you healthy. The food you should eat also will depend on several factors, including:  The calories you need.  The medicines you take.  Your weight.  Your blood glucose level.  Your blood pressure level.  Your cholesterol level. You should eat a variety of foods, including:  Protein.  Lean cuts of meat.  Proteins low in saturated fats, such as fish, egg whites, and beans. Avoid processed meats.  Fruits and vegetables.  Fruits and vegetables that may help control blood glucose levels, such as apples, mangoes, and   yams.  Dairy products.  Choose fat-free or low-fat dairy products, such as milk, yogurt, and cheese.  Grains, bread, pasta, and rice.  Choose whole grain products, such as multigrain bread, whole oats, and brown rice. These foods may help control blood pressure.  Fats.  Foods containing healthful fats, such as nuts,  avocado, olive oil, canola oil, and fish. DOES EVERYONE WITH DIABETES MELLITUS HAVE THE SAME MEAL PLAN? Because every person with diabetes mellitus is different, there is not one meal plan that works for everyone. It is very important that you meet with a dietitian who will help you create a meal plan that is just right for you.   This information is not intended to replace advice given to you by your health care provider. Make sure you discuss any questions you have with your health care provider.   Document Released: 10/23/2004 Document Revised: 02/16/2014 Document Reviewed: 12/23/2012 Elsevier Interactive Patient Education 2016 [employer]  

## 2015-09-26 LAB — COMPLETE METABOLIC PANEL WITH GFR
ALT: 18 U/L (ref 6–29)
AST: 16 U/L (ref 10–35)
Albumin: 3.9 g/dL (ref 3.6–5.1)
Alkaline Phosphatase: 145 U/L — ABNORMAL HIGH (ref 33–115)
BILIRUBIN TOTAL: 0.3 mg/dL (ref 0.2–1.2)
BUN: 12 mg/dL (ref 7–25)
CHLORIDE: 106 mmol/L (ref 98–110)
CO2: 24 mmol/L (ref 20–31)
Calcium: 9.6 mg/dL (ref 8.6–10.2)
Creat: 0.78 mg/dL (ref 0.50–1.10)
GFR, Est African American: >89 mL/min (ref >=60)
GLUCOSE: 128 mg/dL — AB (ref 65–99)
Potassium: 4.5 mmol/L (ref 3.5–5.3)
SODIUM: 140 mmol/L (ref 135–146)
TOTAL PROTEIN: 7.3 g/dL (ref 6.1–8.1)

## 2015-09-26 LAB — MICROALBUMIN / CREATININE URINE RATIO
CREATININE, URINE: 72 mg/dL (ref 20–320)
Microalb Creat Ratio: 10 mcg/mg creat (ref <30)
Microalb, Ur: 0.7 mg/dL

## 2015-09-26 LAB — LIPID PANEL
Cholesterol: 177 mg/dL (ref 125–200)
HDL: 41 mg/dL — AB (ref >=46)
LDL CALC: 91 mg/dL (ref <130)
Total CHOL/HDL Ratio: 4.3 Ratio (ref <=5.0)
Triglycerides: 226 mg/dL — ABNORMAL HIGH (ref <150)
VLDL: 45 mg/dL — AB (ref <30)

## 2015-10-02 ENCOUNTER — Telehealth: Payer: Self-pay

## 2015-10-02 NOTE — Telephone Encounter (Signed)
-----   Message from Jaclyn ShaggyEnobong Amao, MD sent at 09/26/2015  1:58 PM EDT ----- She has mildly elevated triglycerides and over-the-counter facial capsules could help with this. One of her enzymes is also elevated and this could be secondary to the back pain she is experiencing. We will monitor this.

## 2015-10-02 NOTE — Telephone Encounter (Signed)
Writer contacted patient through [employer] interpreters per Dr. Venetia NightAmao.  Patient stated understanding in purchasing and taking fish oil daily to help with her elevated triglycerides.

## 2015-10-30 ENCOUNTER — Encounter (HOSPITAL_BASED_OUTPATIENT_CLINIC_OR_DEPARTMENT_OTHER): Payer: Self-pay

## 2015-10-30 ENCOUNTER — Emergency Department (HOSPITAL_BASED_OUTPATIENT_CLINIC_OR_DEPARTMENT_OTHER)
Admission: EM | Admit: 2015-10-30 | Discharge: 2015-10-30 | Disposition: A | Discharge status: Home / Self Care | Payer: Self-pay | Attending: Emergency Medicine | Admitting: Emergency Medicine

## 2015-10-30 ENCOUNTER — Emergency Department (HOSPITAL_BASED_OUTPATIENT_CLINIC_OR_DEPARTMENT_OTHER): Payer: Self-pay

## 2015-10-30 DIAGNOSIS — Z7984 Long term (current) use of oral hypoglycemic drugs: Secondary | ICD-10-CM | POA: Insufficient documentation

## 2015-10-30 DIAGNOSIS — J40 Bronchitis, not specified as acute or chronic: Secondary | ICD-10-CM | POA: Insufficient documentation

## 2015-10-30 DIAGNOSIS — E119 Type 2 diabetes mellitus without complications: Secondary | ICD-10-CM | POA: Insufficient documentation

## 2015-10-30 MED ORDER — BENZONATATE 100 MG PO CAPS: 100.0000 mg | ORAL_CAPSULE | Freq: Three times a day (TID) | ORAL | 0 refills | Status: DC

## 2015-10-30 MED ORDER — ALBUTEROL SULFATE HFA 108 (90 BASE) MCG/ACT IN AERS
2.0000 | INHALATION_SPRAY | RESPIRATORY_TRACT | Status: DC | PRN
  Administered 2015-10-30: 2 via RESPIRATORY_TRACT
  Filled 2015-10-30: qty 6.7

## 2015-10-30 MED ORDER — BENZONATATE 100 MG PO CAPS
100.0000 mg | ORAL_CAPSULE | Freq: Once | ORAL | Status: AC
  Administered 2015-10-30: 100 mg via ORAL
  Filled 2015-10-30: qty 1

## 2015-10-30 NOTE — ED Triage Notes (Addendum)
Pt c/o cough and throat irritation for the last three weeks since cleaning with very strong bleach.  States the coughing is worse at night, not productive, no fevers, no edema.

## 2015-10-30 NOTE — Discharge Instructions (Signed)
Please read and follow all provided instructions.  Your diagnoses today include:  1. Bronchitis    Tests performed today include:  Chest x-ray - does not show any pneumonia  Vital signs. See below for your results today.   Medications prescribed:   Albuterol inhaler - medication that opens up your airway  Use inhaler as follows: 1-2 puffs with spacer every 4 hours as needed for wheezing, cough, or shortness of breath.    Tessalon Perles - cough suppressant medication  Take any prescribed medications only as directed.  Home care instructions:  Follow any educational materials contained in this packet.  Follow-up instructions: Please follow-up with your primary care provider in the next 7 days for further evaluation of your symptoms and a recheck if you are not feeling better.   Return instructions:   Please return to the Emergency Department if you experience worsening symptoms.  Please return with worsening wheezing, shortness of breath, or difficulty breathing.  Return with persistent fever above 101F.   Please return if you have any other emergent concerns.  Additional Information:  Your vital signs today were: BP 128/65 (BP Location: Left Arm)    Pulse 65    Temp 98.1 F (36.7 C) (Oral)    Resp 18    Ht 5' (1.524 m)    Wt 72.6 kg    LMP 11/12/2013    SpO2 100%    BMI 31.25 kg/m  If your blood pressure (BP) was elevated above 135/85 this visit, please have this repeated by your doctor within one month. --------------
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
## 2015-10-30 NOTE — ED Notes (Signed)
Pt using son as interpreter in triage, would prefer the ipad interpreter during exam

## 2015-10-30 NOTE — ED Notes (Signed)
Son reports pt was cleaning with chlorox a few weeks ago and inhaled too much. Has had non prod cough since.

## 2016-01-07 ENCOUNTER — Ambulatory Visit: Payer: Self-pay | Attending: Family Medicine | Admitting: Family Medicine

## 2016-01-07 VITALS — BP 123/71 | HR 67 | Temp 98.1°F | Ht 62.0 in | Wt 162.6 lb

## 2016-01-07 DIAGNOSIS — E0800 Diabetes mellitus due to underlying condition with hyperosmolarity without nonketotic hyperglycemic-hyperosmolar coma (NKHHC): Secondary | ICD-10-CM

## 2016-01-07 DIAGNOSIS — Z23 Encounter for immunization: Secondary | ICD-10-CM

## 2016-01-07 DIAGNOSIS — E870 Hyperosmolality and hypernatremia: Secondary | ICD-10-CM | POA: Insufficient documentation

## 2016-01-07 DIAGNOSIS — Z79899 Other long term (current) drug therapy: Secondary | ICD-10-CM | POA: Insufficient documentation

## 2016-01-07 DIAGNOSIS — Z7984 Long term (current) use of oral hypoglycemic drugs: Secondary | ICD-10-CM | POA: Insufficient documentation

## 2016-01-07 DIAGNOSIS — L600 Ingrowing nail: Secondary | ICD-10-CM | POA: Insufficient documentation

## 2016-01-07 DIAGNOSIS — E119 Type 2 diabetes mellitus without complications: Secondary | ICD-10-CM | POA: Insufficient documentation

## 2016-01-07 LAB — POCT GLYCOSYLATED HEMOGLOBIN (HGB A1C): HEMOGLOBIN A1C: 7.6

## 2016-01-07 LAB — GLUCOSE, POCT (MANUAL RESULT ENTRY): POC GLUCOSE: 97 mg/dL (ref 70–99)

## 2016-01-07 MED ORDER — DAPAGLIFLOZIN PROPANEDIOL 5 MG PO TABS: 5.0000 mg | ORAL_TABLET | Freq: Every day | ORAL | 4 refills | Status: DC

## 2016-01-07 MED ORDER — CEPHALEXIN 500 MG PO CAPS: 500.0000 mg | ORAL_CAPSULE | Freq: Two times a day (BID) | ORAL | 0 refills | Status: DC

## 2016-01-07 MED ORDER — ATORVASTATIN CALCIUM 40 MG PO TABS: 40.0000 mg | ORAL_TABLET | Freq: Every day | ORAL | 3 refills | Status: DC

## 2016-01-07 MED ORDER — METFORMIN HCL 1000 MG PO TABS: 1000.0000 mg | ORAL_TABLET | Freq: Two times a day (BID) | ORAL | 4 refills | Status: DC

## 2016-01-07 MED ORDER — GLIPIZIDE 5 MG PO TABS: 5.0000 mg | ORAL_TABLET | Freq: Two times a day (BID) | ORAL | 3 refills | Status: DC

## 2016-01-07 NOTE — Progress Notes (Signed)
Subjective:  Patient ID: Margaret Pace, female    DOB: 08/03/1965  Age: 50 y.o. MRN: [id number]  CC: Diabetes   HPI Margaret Pace is a 50 year old female with a history of Type 2 DM (A1c 7.6) who presents to establish care with me. She endorses compliance with her medication as and a Diabetic diet as well as exercise.  Complains of right ingrown toenail for the last few weeks which is painful and sometimes bleeds. Denies history of trauma. She is willing to receive the flu shot today  Past Medical History:  Diagnosis Date  . Anemia    Pt reported blood transfusion after left leg fracture.  . Carpal tunnel syndrome, bilateral   . Diabetes mellitus without complication [facility](HCC)     Past Surgical History:  Procedure Laterality Date  . APPLICATION OF WOUND VAC Left 05/14/2014   Procedure: APPLICATION OF WOUND VAC;  Surgeon: Myrene GalasMichael Handy, MD;  [facility];  Service: Orthopedics;  Laterality: Left;  . FASCIOTOMY Left 05/14/2014   Procedure: FOUR COMPARTMENT FASCIOTOMY;  Surgeon: Myrene GalasMichael Handy, MD;  [facility];  Service: Orthopedics;  Laterality: Left;  . FRACTURE SURGERY    . HARDWARE REMOVAL Left 10/19/2014   Procedure: HARDWARE REMOVAL LEFT TIBIA AND ANKLE;  Surgeon: Myrene GalasMichael Handy, MD;  [facility];  Service: Orthopedics;  Laterality: Left;  . SECONDARY CLOSURE OF WOUND Left 05/17/2014   Procedure: WOUND CLOSURE LEFT LEG ;  Surgeon: Myrene GalasMichael Handy, MD;  [facility];  Service: Orthopedics;  Laterality: Left;  . TIBIA IM NAIL INSERTION Left 05/14/2014   Procedure: INTRAMEDULLARY (IM) NAIL TIBIAL;  Surgeon: Myrene GalasMichael Handy, MD;  [facility];  Service: Orthopedics;  Laterality: Left;    No Known Allergies   Outpatient Medications Prior to Visit  Medication Sig Dispense Refill  . Blood Glucose Monitoring Suppl (TRUE METRIX METER) DEVI 1 each by Does not apply route 3 (three) times daily before meals. 1 Device 0  . glucose blood (TRUE METRIX BLOOD GLUCOSE TEST) test strip Use  three times daily 100 each 12  . naproxen (NAPROSYN) 500 MG tablet Take 1 tablet (500 mg total) by mouth 2 (two) times daily with a meal. 60 tablet 2  . TRUEPLUS LANCETS 28G MISC 1 each by Does not apply route 3 (three) times daily before meals. 100 each 12  . dapagliflozin propanediol (FARXIGA) 5 MG TABS tablet Take 5 mg by mouth daily. 30 tablet 4  . glipiZIDE (GLUCOTROL) 5 MG tablet Take 1 tablet (5 mg total) by mouth 2 (two) times daily before a meal. 60 tablet 3  . metFORMIN (GLUCOPHAGE) 1000 MG tablet Take 1 tablet (1,000 mg total) by mouth 2 (two) times daily with a meal. 60 tablet 4  . silver sulfADIAZINE (SILVADENE) 1 % cream Apply 1 application topically daily. (Patient not taking: Reported on 01/07/2016) 50 g 0  . benzonatate (TESSALON) 100 MG capsule Take 1 capsule (100 mg total) by mouth every 8 (eight) hours. (Patient not taking: Reported on 01/07/2016) 15 capsule 0   No facility-administered medications prior to visit.     ROS Review of Systems  Constitutional: Negative for activity change, appetite change and fatigue.  HENT: Negative for congestion, sinus pressure and sore throat.   Eyes: Negative for visual disturbance.  Respiratory: Negative for cough, chest tightness, shortness of breath and wheezing.   Cardiovascular: Negative for chest pain and palpitations.  Gastrointestinal: Negative for abdominal distention, abdominal pain and constipation.  Endocrine: Negative for polydipsia.  Genitourinary: Negative for dysuria and frequency.  Musculoskeletal:       See hpi  Skin: Negative for rash.  Neurological: Negative for tremors, light-headedness and numbness.  Hematological: Does not bruise/bleed easily.  Psychiatric/Behavioral: Negative for agitation and behavioral problems.    Objective:  BP 123/71 (BP Location: Right Arm, Patient Position: Sitting, Cuff Size: Normal)   Pulse 67   Temp 98.1 F (36.7 C) (Oral)   Ht 5\' 2"  (1.575 m)   Wt 162 lb 9.6 oz (73.8 kg)    LMP 11/12/2013   SpO2 99%   BMI 29.74 kg/m   BP/Weight 01/07/2016 10/30/2015 09/25/2015  Systolic BP 123 128 121  Diastolic BP 71 65 73  Wt. (Lbs) 162.6 160 158  BMI 29.74 31.25 29.37      Physical Exam  Constitutional: She is oriented to person, place, and time. She appears well-developed and well-nourished.  Cardiovascular: Normal rate, normal heart sounds and intact distal pulses.   No murmur heard. Pulmonary/Chest: Effort normal and breath sounds normal. She has no wheezes. She has no rales. She exhibits no tenderness.  Abdominal: Soft. Bowel sounds are normal. She exhibits no distension and no mass. There is no tenderness.  Musculoskeletal:  R big ingrown toenail with dried blood on medial aspect of nail, slightly tender  Neurological: She is alert and oriented to person, place, and time.     Lab Results  Component Value Date   HGBA1C 7.6 01/07/2016    Assessment & Plan:   1. Diabetes mellitus due to underlying condition with hyperosmolarity without coma, without long-term current use of insulin (HCC) Not fully optimized with A1c of 7.6 but this has trended down from 9.4 previously Continue ADA diet - Glucose (CBG) - HgB A1c - Ambulatory referral to Podiatry  2. Type 2 diabetes mellitus without complication, without long-term current use of insulin (HCC) Statin initiated - metFORMIN (GLUCOPHAGE) 1000 MG tablet; Take 1 tablet (1,000 mg total) by mouth 2 (two) times daily with a meal.  Dispense: 60 tablet; Refill: 4 - glipiZIDE (GLUCOTROL) 5 MG tablet; Take 1 tablet (5 mg total) by mouth 2 (two) times daily before a meal.  Dispense: 60 tablet; Refill: 3 - dapagliflozin propanediol (FARXIGA) 5 MG TABS tablet; Take 5 mg by mouth daily.  Dispense: 30 tablet; Refill: 4 - atorvastatin (LIPITOR) 40 MG tablet; Take 1 tablet (40 mg total) by mouth daily.  Dispense: 30 tablet; Refill: 3  3. Ingrown toenail Advised to use warm soaks - cephALEXin (KEFLEX) 500 MG capsule; Take 1  capsule (500 mg total) by mouth 2 (two) times daily.  Dispense: 20 capsule; Refill: 0 - Ambulatory referral to Podiatry   Meds ordered this encounter  Medications  . metFORMIN (GLUCOPHAGE) 1000 MG tablet    Sig: Take 1 tablet (1,000 mg total) by mouth 2 (two) times daily with a meal.    Dispense:  60 tablet    Refill:  4  . glipiZIDE (GLUCOTROL) 5 MG tablet    Sig: Take 1 tablet (5 mg total) by mouth 2 (two) times daily before a meal.    Dispense:  60 tablet    Refill:  3  . dapagliflozin propanediol (FARXIGA) 5 MG TABS tablet    Sig: Take 5 mg by mouth daily.    Dispense:  30 tablet    Refill:  4  . atorvastatin (LIPITOR) 40 MG tablet    Sig: Take 1 tablet (40 mg total) by mouth daily.    Dispense:  30  tablet    Refill:  3  . cephALEXin (KEFLEX) 500 MG capsule    Sig: Take 1 capsule (500 mg total) by mouth 2 (two) times daily.    Dispense:  20 capsule    Refill:  0    Follow-up: Return in about 3 months (around 04/08/2016) for Follow-up on diabetes mellitus.   Jaclyn Shaggy MD

## 2016-01-08 ENCOUNTER — Encounter: Payer: Self-pay | Admitting: Family Medicine

## 2016-02-20 ENCOUNTER — Telehealth: Payer: Self-pay | Admitting: Family Medicine

## 2016-02-20 DIAGNOSIS — E119 Type 2 diabetes mellitus without complications: Secondary | ICD-10-CM

## 2016-02-20 DIAGNOSIS — L600 Ingrowing nail: Secondary | ICD-10-CM

## 2016-02-20 DIAGNOSIS — M545 Low back pain, unspecified: Secondary | ICD-10-CM

## 2016-02-20 MED ORDER — NAPROXEN 500 MG PO TABS: 500.0000 mg | ORAL_TABLET | Freq: Two times a day (BID) | ORAL | 0 refills | Status: DC

## 2016-02-20 MED ORDER — ATORVASTATIN CALCIUM 40 MG PO TABS: 40.0000 mg | ORAL_TABLET | Freq: Every day | ORAL | 0 refills | Status: DC

## 2016-02-20 NOTE — Telephone Encounter (Signed)
Will forward request for cephalexin.

## 2016-02-20 NOTE — Telephone Encounter (Signed)
Patient is needing a refill for medications Naproxen, atorvastatin and cephalexin

## 2016-02-21 MED ORDER — ATORVASTATIN CALCIUM 40 MG PO TABS: 40.0000 mg | ORAL_TABLET | Freq: Every day | ORAL | 3 refills | Status: DC

## 2016-02-21 MED ORDER — NAPROXEN 500 MG PO TABS: 500.0000 mg | ORAL_TABLET | Freq: Two times a day (BID) | ORAL | 0 refills | Status: DC

## 2016-02-21 MED ORDER — CEPHALEXIN 500 MG PO CAPS: 500.0000 mg | ORAL_CAPSULE | Freq: Two times a day (BID) | ORAL | 0 refills | Status: DC

## 2016-03-25 ENCOUNTER — Telehealth: Payer: Self-pay | Admitting: Family Medicine

## 2016-03-25 DIAGNOSIS — M545 Low back pain, unspecified: Secondary | ICD-10-CM

## 2016-03-25 MED ORDER — NAPROXEN 500 MG PO TABS: 500.0000 mg | ORAL_TABLET | Freq: Two times a day (BID) | ORAL | 0 refills | Status: DC

## 2016-03-25 NOTE — Telephone Encounter (Signed)
Patient came to the office to request medication refill for naproxen (NAPROSYN) 500 MG tablet. Please send it to our pharmacy.   Thank you.

## 2016-04-06 ENCOUNTER — Ambulatory Visit: Payer: Self-pay | Admitting: Family Medicine

## 2016-04-14 ENCOUNTER — Encounter: Payer: Self-pay | Admitting: Family Medicine

## 2016-04-14 ENCOUNTER — Ambulatory Visit: Payer: Self-pay | Attending: Family Medicine | Admitting: Family Medicine

## 2016-04-14 ENCOUNTER — Encounter: Payer: Self-pay | Admitting: Licensed Clinical Social Worker

## 2016-04-14 ENCOUNTER — Telehealth: Payer: Self-pay

## 2016-04-14 VITALS — BP 118/71 | HR 56 | Temp 97.4°F | Ht 61.5 in | Wt 160.2 lb

## 2016-04-14 DIAGNOSIS — F32A Depression, unspecified: Secondary | ICD-10-CM

## 2016-04-14 DIAGNOSIS — F329 Major depressive disorder, single episode, unspecified: Secondary | ICD-10-CM | POA: Insufficient documentation

## 2016-04-14 DIAGNOSIS — M545 Low back pain, unspecified: Secondary | ICD-10-CM

## 2016-04-14 DIAGNOSIS — Z79899 Other long term (current) drug therapy: Secondary | ICD-10-CM | POA: Insufficient documentation

## 2016-04-14 DIAGNOSIS — E0800 Diabetes mellitus due to underlying condition with hyperosmolarity without nonketotic hyperglycemic-hyperosmolar coma (NKHHC): Secondary | ICD-10-CM

## 2016-04-14 DIAGNOSIS — E119 Type 2 diabetes mellitus without complications: Secondary | ICD-10-CM | POA: Insufficient documentation

## 2016-04-14 DIAGNOSIS — F418 Other specified anxiety disorders: Secondary | ICD-10-CM

## 2016-04-14 DIAGNOSIS — F419 Anxiety disorder, unspecified: Secondary | ICD-10-CM | POA: Insufficient documentation

## 2016-04-14 DIAGNOSIS — L600 Ingrowing nail: Secondary | ICD-10-CM | POA: Insufficient documentation

## 2016-04-14 DIAGNOSIS — Z7984 Long term (current) use of oral hypoglycemic drugs: Secondary | ICD-10-CM | POA: Insufficient documentation

## 2016-04-14 DIAGNOSIS — Z636 Dependent relative needing care at home: Secondary | ICD-10-CM

## 2016-04-14 LAB — COMPLETE METABOLIC PANEL WITH GFR
ALBUMIN: 4 g/dL (ref 3.6–5.1)
ALK PHOS: 120 U/L (ref 33–130)
ALT: 22 U/L (ref 6–29)
AST: 21 U/L (ref 10–35)
BILIRUBIN TOTAL: 0.5 mg/dL (ref 0.2–1.2)
BUN: 17 mg/dL (ref 7–25)
CO2: 24 mmol/L (ref 20–31)
Calcium: 9.7 mg/dL (ref 8.6–10.4)
Chloride: 104 mmol/L (ref 98–110)
Creat: 0.76 mg/dL (ref 0.50–1.05)
GLUCOSE: 164 mg/dL — AB (ref 65–99)
POTASSIUM: 4.6 mmol/L (ref 3.5–5.3)
Sodium: 139 mmol/L (ref 135–146)
Total Protein: 7.6 g/dL (ref 6.1–8.1)

## 2016-04-14 LAB — LIPID PANEL W/REFLEX DIRECT LDL
CHOL/HDL RATIO: 3.3 ratio (ref <5.0)
Cholesterol: 114 mg/dL (ref <200)
HDL: 35 mg/dL — AB (ref >50)
LDL-Cholesterol: 58 mg/dL
Non-HDL Cholesterol (Calc): 79 mg/dL (ref <130)
TRIGLYCERIDES: 120 mg/dL (ref <150)

## 2016-04-14 LAB — MICROALBUMIN / CREATININE URINE RATIO
CREATININE, URINE: 75 mg/dL (ref 20–320)
Microalb Creat Ratio: 11 mcg/mg creat (ref <30)
Microalb, Ur: 0.8 mg/dL

## 2016-04-14 LAB — POCT GLYCOSYLATED HEMOGLOBIN (HGB A1C): Hemoglobin A1C: 7.8

## 2016-04-14 LAB — GLUCOSE, POCT (MANUAL RESULT ENTRY): POC GLUCOSE: 139 mg/dL — AB (ref 70–99)

## 2016-04-14 MED ORDER — ATORVASTATIN CALCIUM 40 MG PO TABS: 40.0000 mg | ORAL_TABLET | Freq: Every day | ORAL | 3 refills | Status: DC

## 2016-04-14 MED ORDER — METFORMIN HCL 1000 MG PO TABS: 1000.0000 mg | ORAL_TABLET | Freq: Two times a day (BID) | ORAL | 4 refills | Status: DC

## 2016-04-14 MED ORDER — CEPHALEXIN 500 MG PO CAPS: 500.0000 mg | ORAL_CAPSULE | Freq: Two times a day (BID) | ORAL | 0 refills | Status: DC

## 2016-04-14 MED ORDER — NAPROXEN 500 MG PO TABS: 500.0000 mg | ORAL_TABLET | Freq: Two times a day (BID) | ORAL | 2 refills | Status: DC

## 2016-04-14 MED ORDER — DAPAGLIFLOZIN PROPANEDIOL 5 MG PO TABS: 5.0000 mg | ORAL_TABLET | Freq: Every day | ORAL | 4 refills | Status: DC

## 2016-04-14 MED ORDER — GLIPIZIDE 10 MG PO TABS: 10.0000 mg | ORAL_TABLET | Freq: Two times a day (BID) | ORAL | 3 refills | Status: DC

## 2016-04-14 MED ORDER — FLUOXETINE HCL 20 MG PO TABS: 20.0000 mg | ORAL_TABLET | Freq: Every day | ORAL | 3 refills | Status: DC

## 2016-04-14 NOTE — Telephone Encounter (Signed)
CMA call to verify app to podiatry

## 2016-04-14 NOTE — Progress Notes (Signed)
Subjective:  Patient ID: Margaret Pace, female    DOB: 09-05-1965  Age: 51 y.o. MRN: [id number]  CC: Diabetes   HPI  Margaret Pace is a 51 y.o. female who presents for follow up of diabetes.. Current symptoms include: none. Patient denies foot ulcerations, hyperglycemia, hypoglycemia, nausea, paresthesia of the feet and visual disturbances. Evaluation to date has been: fasting blood sugar and hemoglobin A1C. She has not been checking her sugars in the past month but prior to that checked her sugars 2-3 times a day. She currently tries lifestyle modifications including low cholesterol diet and weight loss but because of stress she has not been effective. She says that she has been taking her medications consistently. She is treated with metformin, glipizide, and farxiga. She is on a statin but not on an ace-I. Her ha1c today is 7.8 compared to 7.4 at her last visit 3 months ago.   She also complains of depressed mood onset approximately 1 month ago. She denies suicidal and homicidal plan or intent. She is unsure of a family history of depression. Her daughter has been in the ICU for a month and is not doing well which has her very concerned. She has been sleeping at the hospital day and night and feels worsening sadness and hopelessness. She has not been treated for depression in the past.   She also continues to have an ingrown toenail on the right great toenail. She was previously treated with antibiotics and referred to podiatry. She was unable to keep this appointment due to expense and lack of insurance coverage.   She continues to have pain in her back related to a hx of sciatica. She requests a refill of the medication naproxyn and feels this helps with her pain which is stable. She describes the pain as an ache and feels it is aggravating and uncomfortable.     Outpatient Medications Prior to Visit  Medication Sig Dispense Refill  . Blood Glucose Monitoring Suppl (TRUE METRIX METER) DEVI 1  each by Does not apply route 3 (three) times daily before meals. 1 Device 0  . glucose blood (TRUE METRIX BLOOD GLUCOSE TEST) test strip Use three times daily 100 each 12  . Omega-3 Fatty Acids (FISH OIL) 1000 MG CAPS Take 1,000 mg by mouth once.    . TRUEPLUS LANCETS 28G MISC 1 each by Does not apply route 3 (three) times daily before meals. 100 each 12  . atorvastatin (LIPITOR) 40 MG tablet Take 1 tablet (40 mg total) by mouth daily. 30 tablet 3  . dapagliflozin propanediol (FARXIGA) 5 MG TABS tablet Take 5 mg by mouth daily. 30 tablet 4  . glipiZIDE (GLUCOTROL) 5 MG tablet Take 1 tablet (5 mg total) by mouth 2 (two) times daily before a meal. 60 tablet 3  . metFORMIN (GLUCOPHAGE) 1000 MG tablet Take 1 tablet (1,000 mg total) by mouth 2 (two) times daily with a meal. 60 tablet 4  . naproxen (NAPROSYN) 500 MG tablet Take 1 tablet (500 mg total) by mouth 2 (two) times daily with a meal. 60 tablet 0  . cephALEXin (KEFLEX) 500 MG capsule Take 1 capsule (500 mg total) by mouth 2 (two) times daily. 20 capsule 0  . silver sulfADIAZINE (SILVADENE) 1 % cream Apply 1 application topically daily. (Patient not taking: Reported on 01/07/2016) 50 g 0   No facility-administered medications prior to visit.     ROS Review of Systems  Constitutional: Positive for activity change, appetite change and fatigue.  Negative for unexpected weight change.  HENT: Negative.   Eyes: Negative.   Respiratory: Negative.   Cardiovascular: Negative.   Gastrointestinal: Negative.   Endocrine: Negative.   Genitourinary: Negative.   Musculoskeletal: Negative.        Pain in the right great toe  Skin: Negative.   Neurological: Negative.   Psychiatric/Behavioral: Positive for sleep disturbance. The patient is not nervous/anxious.     Objective:  BP 118/71 (BP Location: Right Arm, Patient Position: Sitting, Cuff Size: Small)   Pulse (!) 56   Temp 97.4 F (36.3 C) (Oral)   Ht 5' 1.5" (1.562 m)   Wt 160 lb 3.2 oz (72.7  kg)   LMP 11/12/2013   SpO2 100%   BMI 29.78 kg/m   BP/Weight 04/14/2016 01/07/2016 10/30/2015  Systolic BP 118 123 128  Diastolic BP 71 71 65  Wt. (Lbs) 160.2 162.6 160  BMI 29.78 29.74 31.25    Lab Results  Component Value Date   WBC 5.8 10/19/2014   HGB 12.4 10/19/2014   HCT 37.0 10/19/2014   PLT 173 10/19/2014   GLUCOSE 128 (H) 09/25/2015   CHOL 177 09/25/2015   TRIG 226 (H) 09/25/2015   HDL 41 (L) 09/25/2015   LDLCALC 91 09/25/2015   ALT 18 09/25/2015   AST 16 09/25/2015   NA 140 09/25/2015   K 4.5 09/25/2015   CL 106 09/25/2015   CREATININE 0.78 09/25/2015   BUN 12 09/25/2015   CO2 24 09/25/2015   TSH 1.750 05/16/2014   INR 0.98 05/14/2014   HGBA1C 7.8 04/14/2016   MICROALBUR 0.7 09/25/2015   Lipid Panel     Component Value Date/Time   CHOL 177 09/25/2015 1144   TRIG 226 (H) 09/25/2015 1144   HDL 41 (L) 09/25/2015 1144   CHOLHDL 4.3 09/25/2015 1144   VLDL 45 (H) 09/25/2015 1144   LDLCALC 91 09/25/2015 1144    Physical Exam  Constitutional: She is oriented to person, place, and time. She appears well-developed and well-nourished.  HENT:  Head: Normocephalic.  Right Ear: External ear normal.  Left Ear: External ear normal.  Eyes: Conjunctivae are normal. Pupils are equal, round, and reactive to light.  Neck: Normal range of motion. No JVD present.  Cardiovascular: Normal rate, regular rhythm, normal heart sounds and intact distal pulses.   Pulmonary/Chest: Effort normal and breath sounds normal. No respiratory distress.  Abdominal: Soft. There is no tenderness.  Musculoskeletal: Normal range of motion.  Right great toe lateral aspect of nail bed is inflamed, serosanguinous discharge, with ingrown nail.   Neurological: She is alert and oriented to person, place, and time.  Skin: Skin is warm and dry.  Psychiatric: Her behavior is normal.  Flat affect     Assessment & Plan:  Type 2 Diabetes Mellitus without the use of insulin - uncontrolled- her  ha1c is elevated today compared to previous visits and remains above her goal of <7%. She is compliant with medications but due to family stress she has not been checking her sugars, eating well, or exercising. We encouraged these today and will increase her glipizide. We will also check her labs today. She is not currently on an ace-I but her blood pressure is within normal limits. We may consider a low dose at her next visit.   Caregiver stress with depressed mood- She has had considerable environmental stress recently and I feel she would benefit from medication. The medication may not improve her sadness but may help prevent worsening symptoms  if her daughter's condition persists or does not improve. I will also have her speak to the social worker today.  Ingrown right big toenail- I think she would benefit most from a visit with podiatry and due to expense we will schedule her for the podiatrist who comes to this office. In the mean time she would benefit from another round of antibiotics and warm water and salt soaks. Wearing well fitting shoes was encouraged.   Right sided low back pain without sciatica- this is a chronic problem for her and appears stable. We will refill her naproxyn today and if her symptoms change or worsen we will consider imaging.    Problem List Items Addressed This Visit      Endocrine   Diabetes mellitus (HCC) - Primary   Relevant Medications   glipiZIDE (GLUCOTROL) 10 MG tablet   atorvastatin (LIPITOR) 40 MG tablet   dapagliflozin propanediol (FARXIGA) 5 MG TABS tablet   metFORMIN (GLUCOPHAGE) 1000 MG tablet   Other Relevant Orders   Glucose (CBG) (Completed)   HgB A1c (Completed)   COMPLETE METABOLIC PANEL WITH GFR   Lipid Panel w/reflex Direct LDL   Microalbumin / creatinine urine ratio    Other Visit Diagnoses    Right-sided low back pain without sciatica, unspecified chronicity       Relevant Medications   naproxen (NAPROSYN) 500 MG tablet   Ingrown  right big toenail       Relevant Orders   Ambulatory referral to Podiatry   Anxiety and depression       Caregiver stress          Meds ordered this encounter  Medications  . glipiZIDE (GLUCOTROL) 10 MG tablet    Sig: Take 1 tablet (10 mg total) by mouth 2 (two) times daily before a meal.    Dispense:  60 tablet    Refill:  3  . atorvastatin (LIPITOR) 40 MG tablet    Sig: Take 1 tablet (40 mg total) by mouth daily.    Dispense:  30 tablet    Refill:  3  . dapagliflozin propanediol (FARXIGA) 5 MG TABS tablet    Sig: Take 5 mg by mouth daily.    Dispense:  30 tablet    Refill:  4  . metFORMIN (GLUCOPHAGE) 1000 MG tablet    Sig: Take 1 tablet (1,000 mg total) by mouth 2 (two) times daily with a meal.    Dispense:  60 tablet    Refill:  4  . naproxen (NAPROSYN) 500 MG tablet    Sig: Take 1 tablet (500 mg total) by mouth 2 (two) times daily with a meal.    Dispense:  60 tablet    Refill:  2  . FLUoxetine (PROZAC) 20 MG tablet    Sig: Take 1 tablet (20 mg total) by mouth daily.    Dispense:  30 tablet    Refill:  3  . cephALEXin (KEFLEX) 500 MG capsule    Sig: Take 1 capsule (500 mg total) by mouth 2 (two) times daily.    Dispense:  20 capsule    Refill:  0    Follow-up: Return in about 3 months (around 07/15/2016) for follow up on Diabetes.   Consuello MasseLauren Tamarcus Condie, AGNP Student

## 2016-04-14 NOTE — Progress Notes (Signed)
Med refill - naproxen dtr in hospital

## 2016-04-14 NOTE — BH Specialist Note (Signed)
Session Start time: 9:40 AM   End Time: 10:05 AM Total Time:  25 minutes Type of Service: Behavioral Health - Individual/Family Interpreter: No.   Interpreter Name & Language: N/A # [facility]   SUBJECTIVE: Margaret BreedMaria Pace is a 51 y.o. female  Pt. was referred by Dr. Venetia NightAmao for:  depression. Pt. reports the following symptoms/concerns: Pt has been experiencing feelings of sadness and hopelessness for a month due to daughter's hospitalization Duration of problem:  One month  Severity: moderate Previous treatment: None reported   OBJECTIVE: Mood: Depressed & Affect: Appropriate Risk of harm to self or others: Pt denied SI/HI/AVH Assessments administered: PHQ-9; GAD-7  LIFE CONTEXT:  Family & Social: Pt resides with her spouse and son School/ Work: Pt is not employed and her spouse is not working due to being "heart sick" The family receives food stamps ($330) Self-Care: Pt was unable to identify any coping skills. No report of substance use Life changes: Pt's daughter has been hospitalized for over a month. Pt stays at hospital throughout the day What is important to pt/family (values): Family   GOALS ADDRESSED:  Increase knowledge of coping skills  INTERVENTIONS: Solution Focused, Strength-based and Supportive   ASSESSMENT:  Pt currently experiencing feelings of sadness and hopelessness for a month due to daughter's hospitalization. She and spouse are unable to work and receive limited support. Pt may benefit from psychotherapy and medication management. LCSWA educated pt on the correlation between psychostressors and the negative impact on one's mental and physical health. LCSWA discussed the benefits of applying healthy coping skills to decrease symptoms of depression. Pt is not interested in initiating therapy; however, is open to medication management. She verbalized difficulty in paying for medication. LCSWA spoke with pharmacy staff regarding pt's  concerns and was informed that patient would be able to obtain medication. LCSWA strongly encouraged pt to schedule appointment with Financial Counseling to assist in financial strain. Pt was provided community resources for therapy, crisis intervention, and medication management.     PLAN: 1. F/U with behavioral health clinician: Pt was encouraged to contact LCSWA if symptoms worsen or fail to improve to [facility]. 2. Behavioral Health meds: Prozac 3. Behavioral recommendations: LCSWA recommends that pt apply healthy coping skills discussed and schedule appointment with Financial Counseling. Pt is encouraged to schedule follow up appointment with LCSWA 4. Referral: Brief Counseling/Psychotherapy, Screening Tool(s)  Administered, [employer]Community Resource, Problem-solving teaching/coping strategies, Psychoeducation and Supportive Counseling 5. From scale of 1-10, how likely are you to follow plan: 7/10   Bridgett LarssonJasmine D Sayra Frisby, MSW, [facility] HospitalCSWA  Clinical Social Worker 04/16/16 10:39 AM  Warmhandoff:   Warm Hand Off Completed.

## 2016-04-14 NOTE — Patient Instructions (Signed)
Ua del pie encarnada (Ingrown Toenail) La ua del pie encarnada se produce cuando las esquinas o los costados de la ua crecen hacia la piel circundante. Es ms frecuente en el dedo gordo, pero puede ocurrir en cualquier dedo del pie. Si la ua del pie encarnada no se trata, puede correr riesgo de infectarse. CAUSAS Este trastorno puede ser causado por:  Uso de calzado muy pequeo o apretado.  Lesin o traumatismo, por ejemplo, al golpearse el dedo contra algo o si alguien se lo pisa.  Cuidado inadecuado de las uas del pie o uas mal cortadas.  Anomalas presentes desde el nacimiento en las uas o los pies (congnitas), por ejemplo, una ua muy grande para el dedo. FACTORES DE RIESGO [address] factores de riesgo de tener una ua del pie encarnada, se incluyen los siguientes:  La edad. Las uas tienden a Product manager con el paso del [place], por lo que las uas encarnadas son ms frecuentes en las personas de [place].  Diabetes.  Uas del pie mal cortadas.  Problemas en la circulacin sangunea. SNTOMAS Entre los sntomas se pueden incluir los siguientes:  Inflamacin o dolor y sensibilidad con la palpacin.  Enrojecimiento.  Hinchazn.  Endurecimiento de la piel alrededor del dedo. Si nota lquido, pus o supuracin, la ua del pie encarnada puede estar infectada. DIAGNSTICO La ua del pie encarnada se puede diagnosticar mediante la historia clnica y un examen fsico. Si la ua est infectada, el mdico puede analizar una muestra del lquido que supura. TRATAMIENTO El tratamiento depende de la gravedad de la ua del pie encarnada. Algunos casos pueden tratarse en casa; otros casos ms graves o en los que la ua se infecta requieren [place] para extirpar la ua total o parcialmente. Las uas del pie encarnadas e infectadas tambin pueden tratarse con antibiticos. INSTRUCCIONES PARA EL CUIDADO EN EL HOGAR  Si le recetaron antibiticos, asegrese de terminarlos, incluso  si comienza a sentirse mejor.  Remoje el pie en agua tibia jabonosa durante 61minutos, 3veces al da, o como se lo haya indicado el mdico.  Separe con cuidado el borde de la ua de la piel dolorida e introduzca un pequeo trozo de algodn debajo de la esquina de la ua. Esto Therapist, occupational. Tenga cuidado de no lesionar ms el rea.  Use zapatos que calcen bien. En caso de que la ua del pie encarnada le cause dolor, intente usar sandalias, si es posible.  Crtese las uas de los pies con cuidado y de forma regular. No las corte de forma curva. Crtese las uas de los pies en lnea recta, para evitar lesiones en la piel en las esquinas de las uas.  Mantenga los pies limpios y secos.  Si tiene problemas para caminar y Environmental consultant, selas segn las indicaciones.  No se toque la ua del pie ni trate de quitarla por su cuenta.  Tome los medicamentos solamente como se lo haya indicado el mdico.  Concurra a todas las visitas de control como se lo haya indicado el mdico. Esto es importante. SOLICITE ATENCIN MDICA SI:  Los sntomas no mejoran con Dispensing optician. SOLICITE ATENCIN MDICA DE INMEDIATO SI:  Tiene lneas rojas que comienzan en el pie y continan en la pierna.  Tiene fiebre.  El enrojecimiento, la hinchazn o el dolor [place].  Observa lquido, sangre o pus que sale de la ua del pie. Esta informacin no tiene Marine scientist el consejo del mdico. Asegrese de hacerle al mdico cualquier  pregunta que tenga. Document Released: 01/26/2005 Document Revised: 06/12/2014 Document Reviewed: 12/20/2013 Elsevier Interactive Patient Education  2017 [employer]

## 2016-04-14 NOTE — Telephone Encounter (Signed)
CMA call to verify if the front desk did her appt with the podiatry   & also if after she take her antibiotics her toe does not improve she can go to the urgent care   Patient did not answer but CMA stated to call back

## 2016-04-16 ENCOUNTER — Other Ambulatory Visit: Payer: Self-pay

## 2016-04-16 MED ORDER — CANAGLIFLOZIN 100 MG PO TABS: 100.0000 mg | ORAL_TABLET | Freq: Every day | ORAL | 3 refills | Status: DC

## 2016-04-21 ENCOUNTER — Telehealth: Payer: Self-pay

## 2016-04-21 NOTE — Telephone Encounter (Signed)
Through [employer] patient was called and lab results were given to patient.  Patient stated an understanding.

## 2016-04-21 NOTE — Telephone Encounter (Signed)
-----   Message from Jaclyn ShaggyEnobong Amao, MD sent at 04/15/2016  1:31 PM EST ----- Please inform the patient that labs are normal. Thank you.

## 2016-04-22 ENCOUNTER — Ambulatory Visit: Payer: Self-pay | Attending: Family Medicine

## 2016-04-29 ENCOUNTER — Ambulatory Visit: Payer: Self-pay | Attending: Family Medicine

## 2016-06-25 ENCOUNTER — Other Ambulatory Visit: Payer: Self-pay | Admitting: Family Medicine

## 2016-06-25 DIAGNOSIS — M545 Low back pain, unspecified: Secondary | ICD-10-CM

## 2016-06-25 DIAGNOSIS — E119 Type 2 diabetes mellitus without complications: Secondary | ICD-10-CM

## 2016-07-15 ENCOUNTER — Ambulatory Visit: Payer: Self-pay | Attending: Family Medicine | Admitting: Family Medicine

## 2016-07-15 ENCOUNTER — Ambulatory Visit: Payer: Self-pay | Attending: Family Medicine

## 2016-07-15 ENCOUNTER — Encounter: Payer: Self-pay | Admitting: Family Medicine

## 2016-07-15 VITALS — BP 119/73 | HR 65 | Temp 97.8°F | Wt 164.4 lb

## 2016-07-15 DIAGNOSIS — Z636 Dependent relative needing care at home: Secondary | ICD-10-CM

## 2016-07-15 DIAGNOSIS — G5603 Carpal tunnel syndrome, bilateral upper limbs: Secondary | ICD-10-CM | POA: Insufficient documentation

## 2016-07-15 DIAGNOSIS — L600 Ingrowing nail: Secondary | ICD-10-CM | POA: Insufficient documentation

## 2016-07-15 DIAGNOSIS — F329 Major depressive disorder, single episode, unspecified: Secondary | ICD-10-CM | POA: Insufficient documentation

## 2016-07-15 DIAGNOSIS — H409 Unspecified glaucoma: Secondary | ICD-10-CM | POA: Insufficient documentation

## 2016-07-15 DIAGNOSIS — E119 Type 2 diabetes mellitus without complications: Secondary | ICD-10-CM | POA: Insufficient documentation

## 2016-07-15 DIAGNOSIS — Z7984 Long term (current) use of oral hypoglycemic drugs: Secondary | ICD-10-CM | POA: Insufficient documentation

## 2016-07-15 DIAGNOSIS — E0800 Diabetes mellitus due to underlying condition with hyperosmolarity without nonketotic hyperglycemic-hyperosmolar coma (NKHHC): Secondary | ICD-10-CM

## 2016-07-15 DIAGNOSIS — F341 Dysthymic disorder: Secondary | ICD-10-CM

## 2016-07-15 DIAGNOSIS — D649 Anemia, unspecified: Secondary | ICD-10-CM | POA: Insufficient documentation

## 2016-07-15 DIAGNOSIS — F419 Anxiety disorder, unspecified: Secondary | ICD-10-CM | POA: Insufficient documentation

## 2016-07-15 LAB — POCT GLYCOSYLATED HEMOGLOBIN (HGB A1C): HEMOGLOBIN A1C: 8.3

## 2016-07-15 LAB — GLUCOSE, POCT (MANUAL RESULT ENTRY): POC GLUCOSE: 82 mg/dL (ref 70–99)

## 2016-07-15 MED ORDER — ATORVASTATIN CALCIUM 40 MG PO TABS: 40.0000 mg | ORAL_TABLET | Freq: Every day | ORAL | 6 refills | Status: DC

## 2016-07-15 MED ORDER — METFORMIN HCL 1000 MG PO TABS: 1000.0000 mg | ORAL_TABLET | Freq: Two times a day (BID) | ORAL | 6 refills | Status: DC

## 2016-07-15 MED ORDER — DAPAGLIFLOZIN PROPANEDIOL 10 MG PO TABS: 10.0000 mg | ORAL_TABLET | Freq: Every day | ORAL | 6 refills | Status: DC

## 2016-07-15 MED ORDER — GLIPIZIDE 10 MG PO TABS: 10.0000 mg | ORAL_TABLET | Freq: Two times a day (BID) | ORAL | 6 refills | Status: DC

## 2016-07-15 MED ORDER — FLUOXETINE HCL 20 MG PO TABS: 20.0000 mg | ORAL_TABLET | Freq: Every day | ORAL | 6 refills | Status: DC

## 2016-07-15 NOTE — Progress Notes (Signed)
Subjective:  Patient ID: Margaret Pace, female    DOB: 1965-06-01  Age: 51 y.o. MRN: [id number]  CC: Diabetes   HPI Margaret Pace  is a 51 year old female with a history of Type 2 DM (A1c 8.3 which is up from 7.8) who presents For a follow-up visit.  Her A1c trended up from 7.6 to 7.8 and now is 8.3 and she endorses compliance with her medications. She has not been compliant with a diabetic diet due to the fact that her daughter has been hospitalized at [facility] for the last 3 months and healthy eating has been a challenge. This has caused her to be depressed and she currently takes an SSRI which helps with symptoms; she denies suicidal ideation or intents.  She does have right ingrown toenail for the last of couple of months and have been on several rounds of antibiotics. This precludes her from wearing closed shoes; she was referred to podiatry whom she has been unable to see due to lack of the [facility] card. Pain is minimal at this time and she endorses performing warm soaks.  Past Medical History:  Diagnosis Date  . Anemia    Pt reported blood transfusion after left leg fracture.  . Carpal tunnel syndrome, bilateral   . Diabetes mellitus without complication [facility])     Past Surgical History:  Procedure Laterality Date  . APPLICATION OF WOUND VAC Left 05/14/2014   Procedure: APPLICATION OF WOUND VAC;  Surgeon: Myrene Galas, MD;  Location: [facility] OR;  Service: Orthopedics;  Laterality: Left;  . FASCIOTOMY Left 05/14/2014   Procedure: FOUR COMPARTMENT FASCIOTOMY;  Surgeon: Myrene Galas, MD;  Location: [facility] OR;  Service: Orthopedics;  Laterality: Left;  . FRACTURE SURGERY    . HARDWARE REMOVAL Left 10/19/2014   Procedure: HARDWARE REMOVAL LEFT TIBIA AND ANKLE;  Surgeon: Myrene Galas, MD;  Location: [facility] OR;  Service: Orthopedics;  Laterality: Left;  . SECONDARY CLOSURE OF WOUND Left 05/17/2014   Procedure: WOUND CLOSURE LEFT LEG ;  Surgeon: Myrene Galas, MD;  Location: [facility] OR;  Service:  Orthopedics;  Laterality: Left;  . TIBIA IM NAIL INSERTION Left 05/14/2014   Procedure: INTRAMEDULLARY (IM) NAIL TIBIAL;  Surgeon: Myrene Galas, MD;  Location: [facility] OR;  Service: Orthopedics;  Laterality: Left;    No Known Allergies   Outpatient Medications Prior to Visit  Medication Sig Dispense Refill  . Blood Glucose Monitoring Suppl (TRUE METRIX METER) DEVI 1 each by Does not apply route 3 (three) times daily before meals. 1 Device 0  . glucose blood (TRUE METRIX BLOOD GLUCOSE TEST) test strip Use three times daily 100 each 12  . naproxen (NAPROSYN) 500 MG tablet TAKE 1 TABLET BY MOUTH 2 TIMES DAILY WITH A MEAL. 60 tablet 2  . Omega-3 Fatty Acids (FISH OIL) 1000 MG CAPS Take 1,000 mg by mouth once.    . TRUEPLUS LANCETS 28G MISC 1 each by Does not apply route 3 (three) times daily before meals. 100 each 12  . atorvastatin (LIPITOR) 40 MG tablet Take 1 tablet (40 mg total) by mouth daily. 30 tablet 3  . cephALEXin (KEFLEX) 500 MG capsule Take 1 capsule (500 mg total) by mouth 2 (two) times daily. 20 capsule 0  . FARXIGA 5 MG TABS tablet TAKE 1 TABLET BY MOUTH DAILY. 30 tablet 2  . FLUoxetine (PROZAC) 20 MG tablet Take 1 tablet (20 mg total) by mouth daily. 30 tablet 3  . glipiZIDE (GLUCOTROL) 10 MG tablet Take 1 tablet (  10 mg total) by mouth 2 (two) times daily before a meal. 60 tablet 3  . metFORMIN (GLUCOPHAGE) 1000 MG tablet Take 1 tablet (1,000 mg total) by mouth 2 (two) times daily with a meal. 60 tablet 4   No facility-administered medications prior to visit.     ROS Review of Systems Constitutional: Negative for activity change, appetite change and fatigue.  HENT: Negative for congestion, sinus pressure and sore throat.   Eyes: Negative for visual disturbance.  Respiratory: Negative for cough, chest tightness, shortness of breath and wheezing.   Cardiovascular: Negative for chest pain and palpitations.  Gastrointestinal: Negative for abdominal distention, abdominal pain and  constipation.  Endocrine: Negative for polydipsia.  Genitourinary: Negative for dysuria and frequency.  Musculoskeletal:       See hpi  Skin: Negative for rash.  Neurological: Negative for tremors, light-headedness and numbness.  Hematological: Does not bruise/bleed easily.  Psychiatric/Behavioral: Negative for agitation and behavioral problems.  Objective:  BP 119/73   Pulse 65   Temp 97.8 F (36.6 C) (Oral)   Wt 164 lb 6.4 oz (74.6 kg)   LMP 11/12/2013   SpO2 95%   BMI 30.56 kg/m   BP/Weight 07/15/2016 04/14/2016 01/07/2016  Systolic BP 119 118 123  Diastolic BP 73 71 71  Wt. (Lbs) 164.4 160.2 162.6  BMI 30.56 29.78 29.74      Physical Exam Constitutional: She is oriented to person, place, and time. She appears well-developed and well-nourished.  Cardiovascular: Normal rate, normal heart sounds and intact distal pulses.   No murmur heard. Pulmonary/Chest: Effort normal and breath sounds normal. She has no wheezes. She has no rales. She exhibits no tenderness.  Abdominal: Soft. Bowel sounds are normal. She exhibits no distension and no mass. There is no tenderness.  Musculoskeletal:  R big ingrown toenail , slightly tender on lateral aspect Neurological: She is alert and oriented to person, place, and time.    CMP Latest Ref Rng & Units 04/14/2016 09/25/2015 10/19/2014  Glucose 65 - 99 mg/dL 161(W164(H) 960(A128(H) 540(J109(H)  BUN 7 - 25 mg/dL 17 12 15   Creatinine 0.50 - 1.05 mg/dL 8.110.76 9.140.78 7.820.62  Sodium 135 - 146 mmol/L 139 140 136  Potassium 3.5 - 5.3 mmol/L 4.6 4.5 3.8  Chloride 98 - 110 mmol/L 104 106 108  CO2 20 - 31 mmol/L 24 24 22   Calcium 8.6 - 10.4 mg/dL 9.7 9.6 9.1  Total Protein 6.1 - 8.1 g/dL 7.6 7.3 -  Total Bilirubin 0.2 - 1.2 mg/dL 0.5 0.3 -  Alkaline Phos 33 - 130 U/L 120 145(H) -  AST 10 - 35 U/L 21 16 -  ALT 6 - 29 U/L 22 18 -    Lipid Panel     Component Value Date/Time   CHOL 114 04/14/2016 0928   TRIG 120 04/14/2016 0928   HDL 35 (L) 04/14/2016 0928    CHOLHDL 3.3 04/14/2016 0928   VLDL 45 (H) 09/25/2015 1144   LDLCALC 91 09/25/2015 1144    Lab Results  Component Value Date   HGBA1C 8.3 07/15/2016    Assessment & Plan:   1. Type 2 diabetes mellitus without complication, without long-term current use of insulin (HCC) Uncontrolled with A1c trending up to 8.3 Increased dose of Farxiga Encouraged to adhere to a diabetic diet as often as she can. - POCT glucose (manual entry) - POCT glycosylated hemoglobin (Hb A1C) - atorvastatin (LIPITOR) 40 MG tablet; Take 1 tablet (40 mg total) by mouth daily.  Dispense: 30  tablet; Refill: 6 - glipiZIDE (GLUCOTROL) 10 MG tablet; Take 1 tablet (10 mg total) by mouth 2 (two) times daily before a meal.  Dispense: 60 tablet; Refill: 6 - metFORMIN (GLUCOPHAGE) 1000 MG tablet; Take 1 tablet (1,000 mg total) by mouth 2 (two) times daily with a meal.  Dispense: 60 tablet; Refill: 6 - dapagliflozin propanediol (FARXIGA) 10 MG TABS tablet; Take 10 mg by mouth daily.  Dispense: 30 tablet; Refill: 6  2. Ingrown toenail Continue warm soaks Patient has been unable to see podiatrist due to glaucoma [place] card Advised to present to urgent care if symptoms worsen  3. ANXIETY DEPRESSION Worsened by caregiver stress Continue Prozac - FLUoxetine (PROZAC) 20 MG tablet; Take 1 tablet (20 mg total) by mouth daily.  Dispense: 30 tablet; Refill: 6  4. Caregiver stress Secondary to caring for her sick daughter.   Meds ordered this encounter  Medications  . atorvastatin (LIPITOR) 40 MG tablet    Sig: Take 1 tablet (40 mg total) by mouth daily.    Dispense:  30 tablet    Refill:  6  . FLUoxetine (PROZAC) 20 MG tablet    Sig: Take 1 tablet (20 mg total) by mouth daily.    Dispense:  30 tablet    Refill:  6  . glipiZIDE (GLUCOTROL) 10 MG tablet    Sig: Take 1 tablet (10 mg total) by mouth 2 (two) times daily before a meal.    Dispense:  60 tablet    Refill:  6  . metFORMIN (GLUCOPHAGE) 1000 MG tablet    Sig:  Take 1 tablet (1,000 mg total) by mouth 2 (two) times daily with a meal.    Dispense:  60 tablet    Refill:  6  . dapagliflozin propanediol (FARXIGA) 10 MG TABS tablet    Sig: Take 10 mg by mouth daily.    Dispense:  30 tablet    Refill:  6    Discontinue previous dose    Follow-up: Return in about 1 month (around 08/14/2016) for Complete physical exam.   Jaclyn Shaggy MD

## 2016-07-29 ENCOUNTER — Emergency Department (HOSPITAL_COMMUNITY)
Admission: EM | Admit: 2016-07-29 | Discharge: 2016-07-29 | Disposition: A | Discharge status: Home / Self Care | Payer: Self-pay | Attending: Emergency Medicine | Admitting: Emergency Medicine

## 2016-07-29 ENCOUNTER — Encounter (HOSPITAL_COMMUNITY): Payer: Self-pay

## 2016-07-29 DIAGNOSIS — E162 Hypoglycemia, unspecified: Secondary | ICD-10-CM

## 2016-07-29 DIAGNOSIS — R112 Nausea with vomiting, unspecified: Secondary | ICD-10-CM

## 2016-07-29 DIAGNOSIS — E11649 Type 2 diabetes mellitus with hypoglycemia without coma: Secondary | ICD-10-CM | POA: Insufficient documentation

## 2016-07-29 DIAGNOSIS — Z79899 Other long term (current) drug therapy: Secondary | ICD-10-CM | POA: Insufficient documentation

## 2016-07-29 DIAGNOSIS — Z7984 Long term (current) use of oral hypoglycemic drugs: Secondary | ICD-10-CM | POA: Insufficient documentation

## 2016-07-29 LAB — URINALYSIS, ROUTINE W REFLEX MICROSCOPIC
Bilirubin Urine: NEGATIVE
Glucose, UA: >=500 mg/dL — AB
Hgb urine dipstick: NEGATIVE
KETONES UR: NEGATIVE mg/dL
Nitrite: NEGATIVE
PH: 5 (ref 5.0–8.0)
Protein, ur: 30 mg/dL — AB
SPECIFIC GRAVITY, URINE: 1.024 (ref 1.005–1.030)

## 2016-07-29 LAB — COMPREHENSIVE METABOLIC PANEL
ALBUMIN: 3.8 g/dL (ref 3.5–5.0)
ALK PHOS: 123 U/L (ref 38–126)
ALT: 20 U/L (ref 14–54)
AST: 30 U/L (ref 15–41)
Anion gap: 9 (ref 5–15)
BILIRUBIN TOTAL: 0.6 mg/dL (ref 0.3–1.2)
BUN: 12 mg/dL (ref 6–20)
CALCIUM: 9.2 mg/dL (ref 8.9–10.3)
CO2: 23 mmol/L (ref 22–32)
CREATININE: 0.87 mg/dL (ref 0.44–1.00)
Chloride: 105 mmol/L (ref 101–111)
GFR calc Af Amer: >60 mL/min (ref >60)
GLUCOSE: 81 mg/dL (ref 65–99)
POTASSIUM: 4.1 mmol/L (ref 3.5–5.1)
Sodium: 137 mmol/L (ref 135–145)
TOTAL PROTEIN: 7.2 g/dL (ref 6.5–8.1)

## 2016-07-29 LAB — CBC
HEMATOCRIT: 40 % (ref 36.0–46.0)
HEMOGLOBIN: 12.9 g/dL (ref 12.0–15.0)
MCH: 28.9 pg (ref 26.0–34.0)
MCHC: 32.3 g/dL (ref 30.0–36.0)
MCV: 89.7 fL (ref 78.0–100.0)
Platelets: 178 10*3/uL (ref 150–400)
RBC: 4.46 MIL/uL (ref 3.87–5.11)
RDW: 14.8 % (ref 11.5–15.5)
WBC: 12.4 10*3/uL — AB (ref 4.0–10.5)

## 2016-07-29 LAB — CBG MONITORING, ED
Glucose-Capillary: 80 mg/dL (ref 65–99)
Glucose-Capillary: 158 mg/dL — ABNORMAL HIGH (ref 65–99)

## 2016-07-29 MED ORDER — ONDANSETRON HCL 4 MG/2ML IJ SOLN
4.0000 mg | Freq: Once | INTRAMUSCULAR | Status: AC
  Administered 2016-07-29: 4 mg via INTRAVENOUS
  Filled 2016-07-29: qty 2

## 2016-07-29 MED ORDER — SODIUM CHLORIDE 0.9 % IV BOLUS (SEPSIS)
1000.0000 mL | Freq: Once | INTRAVENOUS | Status: AC
  Administered 2016-07-29: 1000 mL via INTRAVENOUS

## 2016-07-29 MED ORDER — ONDANSETRON 8 MG PO TBDP: 8.0000 mg | ORAL_TABLET | Freq: Three times a day (TID) | ORAL | 0 refills | Status: DC | PRN

## 2016-07-29 NOTE — ED Notes (Signed)
Pt ambulated to the bathroom independently.

## 2016-07-29 NOTE — ED Notes (Signed)
Pt ambulated to the restroom with out difficulty.

## 2016-07-29 NOTE — ED Triage Notes (Addendum)
Pt presents to the ed after being found unresponsive by family, pt is diabetic and cbg was 7068. Pt was last seen normal by family at 1300. cbg after d50 cbg was 141. Vs wnl. Pt is now alert and oriented
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
## 2016-08-20 ENCOUNTER — Ambulatory Visit (INDEPENDENT_AMBULATORY_CARE_PROVIDER_SITE_OTHER): Payer: Self-pay | Admitting: Podiatry

## 2016-08-20 ENCOUNTER — Encounter: Payer: Self-pay | Admitting: Podiatry

## 2016-08-20 VITALS — BP 132/76 | HR 58 | Resp 16 | Ht 63.0 in | Wt 150.0 lb

## 2016-08-20 DIAGNOSIS — L600 Ingrowing nail: Secondary | ICD-10-CM

## 2016-08-20 NOTE — Progress Notes (Signed)
Subjective:    Patient ID: [name], female   DOB: 51 y.o.   MRN: [id number]   HPI patient presents ingrown toenail deformity right hallux lateral border    ROS      Objective:  Physical Exam neurovascular status intact with mild elevation of sugar stating that her level has been staying stable     Assessment:    Ingrown toenail deformity right hallux     Plan:    Reviewed condition and procedure with interpreter. She explained to the patient and I explained risk of surgery and they want the procedure and today I infiltrated the right hallux 60 Milligan times like Marcaine mixture remove the lateral border exposed matrix and applied phenol 3 applications 30 seconds followed by alcohol lavage and sterile dressing

## 2016-08-20 NOTE — Progress Notes (Signed)
   Subjective:    Patient ID: Margaret Pace, female    DOB: 07/15/1965, 51 y.o.   MRN: [id number]  HPI Chief Complaint  Patient presents with  . Nail Problem    Right foot; great toe-lateral side; pt stated, "Saw pus come out of toe 3 months ago"; pt Diabetic Type 2; Sugar=did not check today; A1C=7.8 (04/14/16)      Review of Systems  All other systems reviewed and are negative.      Objective:   Physical Exam        Assessment & Plan:

## 2016-08-20 NOTE — Patient Instructions (Addendum)
Soak Instructions    THE DAY AFTER THE PROCEDURE  Place 1/4 cup of epsom salts in a quart of warm tap water.  Submerge your foot or feet with outer bandage intact for the initial soak; this will allow the bandage to become moist and wet for easy lift off.  Once you remove your bandage, continue to soak in the solution for 20 minutes.  This soak should be done twice a day.  Next, remove your foot or feet from solution, blot dry the affected area and cover.  You may use a band aid large enough to cover the area or use gauze and tape.  Apply other medications to the area as directed by the doctor such as polysporin neosporin.  IF YOUR SKIN BECOMES IRRITATED WHILE USING THESE INSTRUCTIONS, IT IS OKAY TO SWITCH TO  WHITE VINEGAR AND WATER. Or you may use antibacterial soap and water to keep the toe clean  Monitor for any signs/symptoms of infection. Call the office immediately if any occur or go directly to the emergency room. Call with any questions/concerns.    Long Term Care Instructions-Post Nail Surgery  You have had your ingrown toenail and root treated with a chemical.  This chemical causes a burn that will drain and ooze like a blister.  This can drain for 6-8 weeks or longer.  It is important to keep this area clean, covered, and follow the soaking instructions dispensed at the time of your surgery.  This area will eventually dry and form a scab.  Once the scab forms you no longer need to soak or apply a dressing.  If at any time you experience an increase in pain, redness, swelling, or drainage, you should contact the office as soon as possible.  Plantar Fasciitis (Heel Spur Syndrome) with Rehab The plantar fascia is a fibrous, ligament-like, soft-tissue structure that spans the bottom of the foot. Plantar fasciitis is a condition that causes pain in the foot due to inflammation of the tissue. SYMPTOMS   Pain and tenderness on the underneath side of the foot.  Pain that worsens with  standing or walking. CAUSES  Plantar fasciitis is caused by irritation and injury to the plantar fascia on the underneath side of the foot. Common mechanisms of injury include:  Direct trauma to bottom of the foot.  Damage to a small nerve that runs under the foot where the main fascia attaches to the heel bone.  Stress placed on the plantar fascia due to bone spurs. RISK INCREASES WITH:   Activities that place stress on the plantar fascia (running, jumping, pivoting, or cutting).  Poor strength and flexibility.  Improperly fitted shoes.  Tight calf muscles.  Flat feet.  Failure to warm-up properly before activity.  Obesity. PREVENTION  Warm up and stretch properly before activity.  Allow for adequate recovery between workouts.  Maintain physical fitness:  Strength, flexibility, and endurance.  Cardiovascular fitness.  Maintain a health body weight.  Avoid stress on the plantar fascia.  Wear properly fitted shoes, including arch supports for individuals who have flat feet.  PROGNOSIS  If treated properly, then the symptoms of plantar fasciitis usually resolve without surgery. However, occasionally surgery is necessary.  RELATED COMPLICATIONS   Recurrent symptoms that may result in a chronic condition.  Problems of the lower back that are caused by compensating for the injury, such as limping.  Pain or weakness of the foot during push-off following surgery.  Chronic inflammation, scarring, and partial or complete fascia tear,   occurring more often from repeated injections.  TREATMENT  Treatment initially involves the use of ice and medication to help reduce pain and inflammation. The use of strengthening and stretching exercises may help reduce pain with activity, especially stretches of the Achilles tendon. These exercises may be performed at home or with a therapist. Your caregiver may recommend that you use heel cups of arch supports to help reduce stress on  the plantar fascia. Occasionally, corticosteroid injections are given to reduce inflammation. If symptoms persist for greater than 6 months despite non-surgical (conservative), then surgery may be recommended.   MEDICATION   If pain medication is necessary, then nonsteroidal anti-inflammatory medications, such as aspirin and ibuprofen, or other minor pain relievers, such as acetaminophen, are often recommended.  Do not take pain medication within 7 days before surgery.  Prescription pain relievers may be given if deemed necessary by your caregiver. Use only as directed and only as much as you need.  Corticosteroid injections may be given by your caregiver. These injections should be reserved for the most serious cases, because they may only be given a certain number of times.  HEAT AND COLD  Cold treatment (icing) relieves pain and reduces inflammation. Cold treatment should be applied for 10 to 15 minutes every 2 to 3 hours for inflammation and pain and immediately after any activity that aggravates your symptoms. Use ice packs or massage the area with a piece of ice (ice massage).  Heat treatment may be used prior to performing the stretching and strengthening activities prescribed by your caregiver, physical therapist, or athletic trainer. Use a heat pack or soak the injury in warm water.  SEEK IMMEDIATE MEDICAL CARE IF:  Treatment seems to offer no benefit, or the condition worsens.  Any medications produce adverse side effects.  EXERCISES- RANGE OF MOTION (ROM) AND STRETCHING EXERCISES - Plantar Fasciitis (Heel Spur Syndrome) These exercises may help you when beginning to rehabilitate your injury. Your symptoms may resolve with or without further involvement from your physician, physical therapist or athletic trainer. While completing these exercises, remember:   Restoring tissue flexibility helps normal motion to return to the joints. This allows healthier, less painful movement and  activity.  An effective stretch should be held for at least 30 seconds.  A stretch should never be painful. You should only feel a gentle lengthening or release in the stretched tissue.  RANGE OF MOTION - Toe Extension, Flexion  Sit with your right / left leg crossed over your opposite knee.  Grasp your toes and gently pull them back toward the top of your foot. You should feel a stretch on the bottom of your toes and/or foot.  Hold this stretch for 10 seconds.  Now, gently pull your toes toward the bottom of your foot. You should feel a stretch on the top of your toes and or foot.  Hold this stretch for 10 seconds. Repeat  times. Complete this stretch 3 times per day.   RANGE OF MOTION - Ankle Dorsiflexion, Active Assisted  Remove shoes and sit on a chair that is preferably not on a carpeted surface.  Place right / left foot under knee. Extend your opposite leg for support.  Keeping your heel down, slide your right / left foot back toward the chair until you feel a stretch at your ankle or calf. If you do not feel a stretch, slide your bottom forward to the edge of the chair, while still keeping your heel down.  Hold this stretch   for 10 seconds. Repeat 3 times. Complete this stretch 2 times per day.   STRETCH  Gastroc, Standing  Place hands on wall.  Extend right / left leg, keeping the front knee somewhat bent.  Slightly point your toes inward on your back foot.  Keeping your right / left heel on the floor and your knee straight, shift your weight toward the wall, not allowing your back to arch.  You should feel a gentle stretch in the right / left calf. Hold this position for 10 seconds. Repeat 3 times. Complete this stretch 2 times per day.  STRETCH  Soleus, Standing  Place hands on wall.  Extend right / left leg, keeping the other knee somewhat bent.  Slightly point your toes inward on your back foot.  Keep your right / left heel on the floor, bend your back  knee, and slightly shift your weight over the back leg so that you feel a gentle stretch deep in your back calf.  Hold this position for 10 seconds. Repeat 3 times. Complete this stretch 2 times per day.  STRETCH  Gastrocsoleus, Standing  Note: This exercise can place a lot of stress on your foot and ankle. Please complete this exercise only if specifically instructed by your caregiver.   Place the ball of your right / left foot on a step, keeping your other foot firmly on the same step.  Hold on to the wall or a rail for balance.  Slowly lift your other foot, allowing your body weight to press your heel down over the edge of the step.  You should feel a stretch in your right / left calf.  Hold this position for 10 seconds.  Repeat this exercise with a slight bend in your right / left knee. Repeat 3 times. Complete this stretch 2 times per day.   STRENGTHENING EXERCISES - Plantar Fasciitis (Heel Spur Syndrome)  These exercises may help you when beginning to rehabilitate your injury. They may resolve your symptoms with or without further involvement from your physician, physical therapist or athletic trainer. While completing these exercises, remember:   Muscles can gain both the endurance and the strength needed for everyday activities through controlled exercises.  Complete these exercises as instructed by your physician, physical therapist or athletic trainer. Progress the resistance and repetitions only as guided.  STRENGTH - Towel Curls  Sit in a chair positioned on a non-carpeted surface.  Place your foot on a towel, keeping your heel on the floor.  Pull the towel toward your heel by only curling your toes. Keep your heel on the floor. Repeat 3 times. Complete this exercise 2 times per day.  STRENGTH - Ankle Inversion  Secure one end of a rubber exercise band/tubing to a fixed object (table, pole). Loop the other end around your foot just before your toes.  Place your  fists between your knees. This will focus your strengthening at your ankle.  Slowly, pull your big toe up and in, making sure the band/tubing is positioned to resist the entire motion.  Hold this position for 10 seconds.  Have your muscles resist the band/tubing as it slowly pulls your foot back to the starting position. Repeat 3 times. Complete this exercises 2 times per day.  Document Released: 01/26/2005 Document Revised: 04/20/2011 Document Reviewed: 05/10/2008 ExitCare Patient Information 2014 [employer]. 

## 2016-08-27 ENCOUNTER — Encounter: Payer: Self-pay | Admitting: Family Medicine

## 2016-09-04 ENCOUNTER — Ambulatory Visit: Payer: No Typology Code available for payment source | Admitting: Podiatry

## 2016-09-08 ENCOUNTER — Encounter: Payer: Self-pay | Admitting: Family Medicine

## 2016-09-08 ENCOUNTER — Ambulatory Visit: Payer: Self-pay | Attending: Family Medicine | Admitting: Family Medicine

## 2016-09-08 VITALS — BP 107/71 | HR 69 | Temp 98.2°F | Resp 18 | Ht 62.0 in | Wt 158.0 lb

## 2016-09-08 DIAGNOSIS — E119 Type 2 diabetes mellitus without complications: Secondary | ICD-10-CM | POA: Insufficient documentation

## 2016-09-08 DIAGNOSIS — Z1231 Encounter for screening mammogram for malignant neoplasm of breast: Secondary | ICD-10-CM

## 2016-09-08 DIAGNOSIS — Z79899 Other long term (current) drug therapy: Secondary | ICD-10-CM | POA: Insufficient documentation

## 2016-09-08 DIAGNOSIS — Z114 Encounter for screening for human immunodeficiency virus [HIV]: Secondary | ICD-10-CM

## 2016-09-08 DIAGNOSIS — Z124 Encounter for screening for malignant neoplasm of cervix: Secondary | ICD-10-CM

## 2016-09-08 DIAGNOSIS — Z1239 Encounter for other screening for malignant neoplasm of breast: Secondary | ICD-10-CM

## 2016-09-08 DIAGNOSIS — Z1211 Encounter for screening for malignant neoplasm of colon: Secondary | ICD-10-CM

## 2016-09-08 DIAGNOSIS — Z7984 Long term (current) use of oral hypoglycemic drugs: Secondary | ICD-10-CM | POA: Insufficient documentation

## 2016-09-08 DIAGNOSIS — Z0000 Encounter for general adult medical examination without abnormal findings: Secondary | ICD-10-CM

## 2016-09-08 NOTE — Progress Notes (Signed)
Subjective:  Patient ID: [name], female    DOB: 09/28/1965  Age: 51 y.o. MRN: [id number]  CC: Annual Exam   HPI [name] presents for a complete physical exam.  Past Medical History:  Diagnosis Date  . Anemia    Pt reported blood transfusion after left leg fracture.  . Carpal tunnel syndrome, bilateral   . Diabetes mellitus without complication [facility](HCC)     Past Surgical History:  Procedure Laterality Date  . APPLICATION OF WOUND VAC Left 05/14/2014   Procedure: APPLICATION OF WOUND VAC;  Surgeon: Myrene GalasMichael Handy, MD;  [facility];  Service: Orthopedics;  Laterality: Left;  . FASCIOTOMY Left 05/14/2014   Procedure: FOUR COMPARTMENT FASCIOTOMY;  Surgeon: Myrene GalasMichael Handy, MD;  [facility];  Service: Orthopedics;  Laterality: Left;  . FRACTURE SURGERY    . HARDWARE REMOVAL Left 10/19/2014   Procedure: HARDWARE REMOVAL LEFT TIBIA AND ANKLE;  Surgeon: Myrene GalasMichael Handy, MD;  [facility];  Service: Orthopedics;  Laterality: Left;  . SECONDARY CLOSURE OF WOUND Left 05/17/2014   Procedure: WOUND CLOSURE LEFT LEG ;  Surgeon: Myrene GalasMichael Handy, MD;  [facility];  Service: Orthopedics;  Laterality: Left;  . TIBIA IM NAIL INSERTION Left 05/14/2014   Procedure: INTRAMEDULLARY (IM) NAIL TIBIAL;  Surgeon: Myrene GalasMichael Handy, MD;  [facility];  Service: Orthopedics;  Laterality: Left;    No Known Allergies    Outpatient Medications Prior to Visit  Medication Sig Dispense Refill  . atorvastatin (LIPITOR) 40 MG tablet Take 1 tablet (40 mg total) by mouth daily. 30 tablet 6  . Blood Glucose Monitoring Suppl (TRUE METRIX METER) DEVI 1 each by Does not apply route 3 (three) times daily before meals. 1 Device 0  . dapagliflozin propanediol (FARXIGA) 10 MG TABS tablet Take 10 mg by mouth daily. 30 tablet 6  . FLUoxetine (PROZAC) 20 MG tablet Take 1 tablet (20 mg total) by mouth daily. 30 tablet 6  . glipiZIDE (GLUCOTROL) 10 MG tablet Take 1 tablet (10 mg total) by mouth 2 (two) times daily  before a meal. 60 tablet 6  . glucose blood (TRUE METRIX BLOOD GLUCOSE TEST) test strip Use three times daily 100 each 12  . metFORMIN (GLUCOPHAGE) 1000 MG tablet Take 1 tablet (1,000 mg total) by mouth 2 (two) times daily with a meal. 60 tablet 6  . naproxen (NAPROSYN) 500 MG tablet TAKE 1 TABLET BY MOUTH 2 TIMES DAILY WITH A MEAL. 60 tablet 2  . ondansetron (ZOFRAN ODT) 8 MG disintegrating tablet Take 1 tablet (8 mg total) by mouth every 8 (eight) hours as needed for nausea or vomiting. 10 tablet 0  . TRUEPLUS LANCETS 28G MISC 1 each by Does not apply route 3 (three) times daily before meals. 100 each 12   No facility-administered medications prior to visit.     ROS Review of Systems  Constitutional: Negative for activity change, appetite change and fatigue.  HENT: Negative for congestion, sinus pressure and sore throat.   Eyes: Negative for visual disturbance.  Respiratory: Negative for cough, chest tightness, shortness of breath and wheezing.   Cardiovascular: Negative for chest pain and palpitations.  Gastrointestinal: Negative for abdominal distention, abdominal pain and constipation.  Endocrine: Negative for polydipsia.  Genitourinary: Negative for dysuria and frequency.  Musculoskeletal: Negative for arthralgias and back pain.  Skin: Negative for rash.  Neurological: Negative for tremors, light-headedness and numbness.  Hematological: Does not bruise/bleed easily.  Psychiatric/Behavioral: Negative for agitation and behavioral problems.  Objective:  BP 107/71 (BP Location: Left Arm, Patient Position: Sitting, Cuff Size: Normal)   Pulse 69   Temp 98.2 F (36.8 C) (Oral)   Resp 18   Ht 5\' 2"  (1.575 m)   Wt 158 lb (71.7 kg)   LMP 11/12/2013   SpO2 100%   BMI 28.90 kg/m   BP/Weight 09/08/2016 08/20/2016 07/29/2016  Systolic BP 107 132 110  Diastolic BP 71 76 62  Wt. (Lbs) 158 150 -  BMI 28.9 26.57 -      Physical Exam  Constitutional: She is oriented to person,  place, and time. She appears well-developed and well-nourished. No distress.  HENT:  Head: Normocephalic.  Right Ear: External ear normal.  Left Ear: External ear normal.  Nose: Nose normal.  Mouth/Throat: Oropharynx is clear and moist.  Eyes: Pupils are equal, round, and reactive to light. Conjunctivae and EOM are normal.  Neck: Normal range of motion. No JVD present.  Cardiovascular: Normal rate, regular rhythm, normal heart sounds and intact distal pulses.  Exam reveals no gallop.   No murmur heard. Pulmonary/Chest: Effort normal and breath sounds normal. No respiratory distress. She has no wheezes. She has no rales. She exhibits no tenderness. Right breast exhibits no mass and no tenderness. Left breast exhibits no mass and no tenderness.  Abdominal: Soft. Bowel sounds are normal. She exhibits no distension and no mass. There is no tenderness.  Genitourinary:  Genitourinary Comments: External genitalia, vagina, cervix, adnexa - all normal  Musculoskeletal: Normal range of motion. She exhibits no edema or tenderness.  Neurological: She is alert and oriented to person, place, and time. She has normal reflexes.  Skin: Skin is warm and dry. She is not diaphoretic.  Psychiatric: She has a normal mood and affect.     Assessment & Plan:   1. Annual physical exam  2. Screening for breast cancer - MM Digital Screening; Future  3. Screening for cervical cancer - Cytology - PAP [place]  4. Screening for colon cancer - Ambulatory referral to Gastroenterology  5. Screening for HIV (human immunodeficiency virus) - HIV antibody (with reflex)   No orders of the defined types were placed in this encounter.   Follow-up: Return in about 3 months (around 12/09/2016) for Follow-up of chronic medical conditions.   This note has been created with Education officer, environmentalDragon speech recognition software and smart phrase technology. Any transcriptional errors are unintentional.     Jaclyn ShaggyEnobong Amao MD

## 2016-09-08 NOTE — Progress Notes (Signed)
Patient has eaten  °Patient has had medication  °

## 2016-09-08 NOTE — Patient Instructions (Signed)

## 2016-09-09 LAB — HIV ANTIBODY (ROUTINE TESTING W REFLEX): HIV Screen 4th Generation wRfx: NONREACTIVE

## 2016-09-10 LAB — CYTOLOGY - PAP
Bacterial vaginitis: POSITIVE — AB
CHLAMYDIA, DNA PROBE: NEGATIVE
Candida vaginitis: NEGATIVE
DIAGNOSIS: NEGATIVE
HPV: DETECTED — AB
Neisseria Gonorrhea: NEGATIVE
TRICH (WINDOWPATH): NEGATIVE

## 2016-09-14 ENCOUNTER — Other Ambulatory Visit: Payer: Self-pay | Admitting: Family Medicine

## 2016-09-14 DIAGNOSIS — R87810 Cervical high risk human papillomavirus (HPV) DNA test positive: Secondary | ICD-10-CM

## 2016-09-14 MED ORDER — METRONIDAZOLE 0.75 % VA GEL: 1.0000 | Freq: Every day | VAGINAL | 0 refills | Status: DC

## 2016-09-22 ENCOUNTER — Other Ambulatory Visit: Payer: Self-pay | Admitting: Family Medicine

## 2016-09-22 DIAGNOSIS — M545 Low back pain, unspecified: Secondary | ICD-10-CM

## 2016-09-24 ENCOUNTER — Telehealth: Payer: Self-pay

## 2016-09-24 NOTE — Telephone Encounter (Signed)
CMA call regarding lab results  Patient did not answer daughter did stated that she was busy & will return call

## 2016-10-01 NOTE — Telephone Encounter (Signed)
Left message to call back. Will send a letter for a patient to call back for results.

## 2016-10-05 ENCOUNTER — Encounter: Payer: Self-pay | Admitting: Obstetrics and Gynecology

## 2016-10-13 ENCOUNTER — Telehealth: Payer: Self-pay

## 2016-10-13 NOTE — Telephone Encounter (Signed)
Patient return CMA call regarding lab results   Patient verify DOB   Patient was aware and understood   Patient was aware of her upcoming appt with the gynecology

## 2016-10-26 ENCOUNTER — Telehealth: Payer: Self-pay

## 2016-10-26 NOTE — Telephone Encounter (Signed)
Pt's daughter, Selena Lesser, called to set up colonoscopy for her mother. She had received a triage letter from DS. Please call the daughter at [phone]

## 2016-10-29 ENCOUNTER — Telehealth: Payer: Self-pay

## 2016-10-29 NOTE — Telephone Encounter (Signed)
See separate triage.  

## 2016-11-02 MED ORDER — PEG 3350-KCL-NA BICARB-NACL 420 G PO SOLR: 4000.0000 mL | ORAL | 0 refills | Status: DC

## 2016-11-02 NOTE — Telephone Encounter (Signed)
Day of bowel prep: Farxiga 1/2 table, glipizide 1/2 tab bid, metformin 1/2 tab bid.

## 2016-11-02 NOTE — Telephone Encounter (Signed)
Rx sent to the pharmacy and instructions mailed to pt.  

## 2016-11-02 NOTE — Telephone Encounter (Addendum)
Gastroenterology Pre-Procedure Review  Request Date: 10/29/2016 Requesting Physician: Odie Sera  PATIENT REVIEW QUESTIONS: The patient responded to the following health history questions as indicated:    PT's daughter, Selena Lesser, gave the info as she asked her mother. She will bring the pt to the hospital. This is first colonoscopy for pt.   1. Diabetes Melitis: YES 2. Joint replacements in the past 12 months: no 3. Major health problems in the past 3 months: no 4. Has an artificial valve or MVP: no 5. Has a defibrillator: no 6. Has been advised in past to take antibiotics in advance of a procedure like teeth cleaning: no 7. Family history of colon cancer: no  8. Alcohol Use: no 9. History of sleep apnea: no  10. History of coronary artery or other vascular stents placed within the last 12 months: no 11. History of any prior anesthesia complications: no    MEDICATIONS & ALLERGIES:    Patient reports the following regarding taking any blood thinners:   Plavix? no Aspirin? no Coumadin? no Brilinta? no Xarelto? no Eliquis? no Pradaxa? no Savaysa? no Effient? no  Patient confirms/reports the following medications:  Current Outpatient Prescriptions  Medication Sig Dispense Refill  . atorvastatin (LIPITOR) 40 MG tablet Take 1 tablet (40 mg total) by mouth daily. 30 tablet 6  . dapagliflozin propanediol (FARXIGA) 10 MG TABS tablet Take 10 mg by mouth daily. 30 tablet 6  . glipiZIDE (GLUCOTROL) 10 MG tablet Take 1 tablet (10 mg total) by mouth 2 (two) times daily before a meal. 60 tablet 6  . metFORMIN (GLUCOPHAGE) 1000 MG tablet Take 1 tablet (1,000 mg total) by mouth 2 (two) times daily with a meal. 60 tablet 6  . naproxen (NAPROSYN) 500 MG tablet TAKE 1 TABLET BY MOUTH 2 TIMES DAILY WITH A MEAL. 60 tablet 2  . Blood Glucose Monitoring Suppl (TRUE METRIX METER) DEVI 1 each by Does not apply route 3 (three) times daily before meals. 1 Device 0  . glucose blood (TRUE METRIX BLOOD  GLUCOSE TEST) test strip Use three times daily 100 each 12  . TRUEPLUS LANCETS 28G MISC 1 each by Does not apply route 3 (three) times daily before meals. 100 each 12   No current facility-administered medications for this visit.     Patient confirms/reports the following allergies:  No Known Allergies  No orders of the defined types were placed in this encounter.   AUTHORIZATION INFORMATION Primary Insurance:   ID #: Group #: Pre-Cert / Auth required: Pre-Cert / Auth #:   Secondary Insurance:  ID #:   Group #:  Pre-Cert / Auth required: Pre-Cert / Auth #:   SCHEDULE INFORMATION: Procedure has been scheduled as follows:  Date: 12/03/2016          Time: 9:00 AM  Location: [facility] Short Stay  This Gastroenterology Pre-Precedure Review Form is being routed to the following provider(s): R. Roetta Sessions, MD

## 2016-11-03 NOTE — Telephone Encounter (Signed)
Left Vm for daughter, Selena Lesser, that I need to reschedule the appt from 12/03/2016. Dr. Jena Gauss will be doing OR cases that day.

## 2016-11-04 ENCOUNTER — Other Ambulatory Visit: Payer: Self-pay | Admitting: Family Medicine

## 2016-11-04 DIAGNOSIS — M545 Low back pain, unspecified: Secondary | ICD-10-CM

## 2016-11-05 NOTE — Telephone Encounter (Signed)
I spoke to [place] and she is aware we have cancelled the 12/03/2016 appt. I will call her when we get the NOV schedule. It has to be on a Tues or Thurs.

## 2016-11-06 ENCOUNTER — Ambulatory Visit: Payer: Self-pay | Admitting: Obstetrics and Gynecology

## 2016-11-17 ENCOUNTER — Other Ambulatory Visit: Payer: Self-pay

## 2016-11-17 DIAGNOSIS — Z1211 Encounter for screening for malignant neoplasm of colon: Secondary | ICD-10-CM

## 2016-11-26 ENCOUNTER — Ambulatory Visit: Payer: Self-pay | Admitting: Obstetrics & Gynecology

## 2016-12-07 ENCOUNTER — Telehealth: Payer: Self-pay | Admitting: Internal Medicine

## 2016-12-07 NOTE — Telephone Encounter (Signed)
I called Margaret Pace in Endo and she cancelled the pts procedure.

## 2016-12-07 NOTE — Telephone Encounter (Signed)
Pt wants to cancel her colonoscopy. She has the [employer] Card and wasn't aware that we don't accept that. I told her that she does have  Assistance and it's good until December 6th.  She asked for me to mail her another application so she could reapply. Patient does not want colonoscopy at this time because she doesn't have any money and wants to wait.

## 2016-12-18 ENCOUNTER — Encounter: Payer: Self-pay | Admitting: Family Medicine

## 2016-12-18 ENCOUNTER — Ambulatory Visit: Payer: Self-pay | Attending: Family Medicine | Admitting: Family Medicine

## 2016-12-18 VITALS — BP 135/76 | HR 65 | Temp 97.9°F | Resp 18 | Ht 62.0 in | Wt 160.4 lb

## 2016-12-18 DIAGNOSIS — D649 Anemia, unspecified: Secondary | ICD-10-CM | POA: Insufficient documentation

## 2016-12-18 DIAGNOSIS — M545 Low back pain, unspecified: Secondary | ICD-10-CM

## 2016-12-18 DIAGNOSIS — Z79899 Other long term (current) drug therapy: Secondary | ICD-10-CM | POA: Insufficient documentation

## 2016-12-18 DIAGNOSIS — Z7984 Long term (current) use of oral hypoglycemic drugs: Secondary | ICD-10-CM | POA: Insufficient documentation

## 2016-12-18 DIAGNOSIS — E0800 Diabetes mellitus due to underlying condition with hyperosmolarity without nonketotic hyperglycemic-hyperosmolar coma (NKHHC): Secondary | ICD-10-CM

## 2016-12-18 DIAGNOSIS — E119 Type 2 diabetes mellitus without complications: Secondary | ICD-10-CM

## 2016-12-18 DIAGNOSIS — Z9119 Patient's noncompliance with other medical treatment and regimen: Secondary | ICD-10-CM | POA: Insufficient documentation

## 2016-12-18 DIAGNOSIS — G5603 Carpal tunnel syndrome, bilateral upper limbs: Secondary | ICD-10-CM | POA: Insufficient documentation

## 2016-12-18 DIAGNOSIS — Z23 Encounter for immunization: Secondary | ICD-10-CM

## 2016-12-18 DIAGNOSIS — E1165 Type 2 diabetes mellitus with hyperglycemia: Secondary | ICD-10-CM | POA: Insufficient documentation

## 2016-12-18 LAB — POCT GLYCOSYLATED HEMOGLOBIN (HGB A1C): Hemoglobin A1C: 10.7

## 2016-12-18 LAB — GLUCOSE, POCT (MANUAL RESULT ENTRY): POC GLUCOSE: 176 mg/dL — AB (ref 70–99)

## 2016-12-18 MED ORDER — NAPROXEN 500 MG PO TABS: ORAL_TABLET | ORAL | 2 refills | Status: DC

## 2016-12-18 MED ORDER — DAPAGLIFLOZIN PROPANEDIOL 10 MG PO TABS: 10.0000 mg | ORAL_TABLET | Freq: Every day | ORAL | 6 refills | Status: DC

## 2016-12-18 MED ORDER — ATORVASTATIN CALCIUM 40 MG PO TABS: 40.0000 mg | ORAL_TABLET | Freq: Every day | ORAL | 6 refills | Status: DC

## 2016-12-18 MED ORDER — METFORMIN HCL 1000 MG PO TABS: 1000.0000 mg | ORAL_TABLET | Freq: Two times a day (BID) | ORAL | 6 refills | Status: DC

## 2016-12-18 MED ORDER — GLIPIZIDE 10 MG PO TABS: 10.0000 mg | ORAL_TABLET | Freq: Two times a day (BID) | ORAL | 6 refills | Status: DC

## 2016-12-18 NOTE — Progress Notes (Signed)
Patient is here for f/up  

## 2016-12-18 NOTE — Patient Instructions (Addendum)
Diabetes mellitus e alimentao (Diabetes Mellitus and Food)  importante que voc controle o nvel de acar em seu sangue (glicose). O nvel de acar em seu sangue pode ser bastante afetado pelo que voc ingere. Comer alimentos saudveis, em quantidades apropriadas durante o dia, por volta do mesmo horrio todos os dias o ajudar a Nurse, adult nvel de glicose sangunea. Isso tambm pode ajudar a retardar ou impedir a piora de sua diabetes mellitus. Uma alimentao saudvel pode at ajud-lo a melhorar o nvel de sua presso arterial e a Visual merchandiser ou manter um peso saudvel. As recomendaes gerais para uma alimentao e hbitos culinrios saudveis incluem:  Ingerir refeies e lanches regularmente. Evitar ficar longos perodos sem comer para perder peso.  Fazer uma dieta que consista principalmente em vegetais, como frutas, legumes, castanhas, leguminosas e gros integrais.  Usar mtodos de cozimento de baixo calor, como assar, em vez de mtodos de cozimento de Musician, como fritura por imerso. [place] com seu nutricionista para garantir que voc compreenda como usar as informaes nutricionais nos rtulos dos alimentos. COMO A COMIDA PODE ME AFETAR? Carboidratos Os carboidratos afetam o nvel de [address] glicose sangunea mais do que qualquer outro tipo de [place]. Seu nutricionista o ajudar a determinar a quantidade de carboidratos a ser consumida a cada refeio e lhe ensinar como contar os carboidratos. A contagem de carboidratos  importante para a manuteno de sua glicose sangunea em um nvel saudvel, principalmente se voc estiver usando insulina ou tomando certos medicamentos para o diabetes mellitus. lcool O lcool pode causar redues repentinas na glicemia sangunea (hipoglicemia), principalmente se voc [place] insulina ou toma certos medicamentos para o diabetes mellitus. A hipoglicemia pode ser uma condio com risco de morte. Os sintomas da hipoglicemia (sonolncia e  desorientao) so parecidos com os sintomas da ingesto abusiva de lcool. Se seu mdico liberar a ingesto de lcool, beba com moderao e siga as seguintes instrues:  Mulheres no devem tomar mais de um drink por dia e homens no devem tomar mais de dois drinks por dia. Um drink  igual a: ? 12 onas (340 mL) de cerveja. ? 5 onas (140 mL) de vinho. ? 1 ona (40 mL) de destilados.  No beba de estmago vazio.  Mantenha-se hidratado. Tome gua, refrigerantes dietticos ou ch gelado sem acar.  Refrigerantes comuns, sucos e outras misturas podem conter muitos carboidratos, que devem ser contados. QUAIS SO OS ALIMENTOS NO RECOMENDADOS? Ao fazer as [employer],  importante lembrar que eles so diferentes. Alguns alimentos contm menos nutrientes por poro do que outros, embora eles possam ter o mesmo nmero de calorias ou carboidratos.  difcil dar ao corpo o que ele precisa quando voc come alimentos com menos nutrientes. Os exemplos de alimentos que voc deve evitar, que so ricos em calorias e carboidratos, porm pobres em nutrientes, incluem:  Gorduras trans (a [employer] as gorduras trans no rtulo de informaes nutricionais).  Refrigerante comum.  Sucos.  Doces.  Bolos, tortas, donuts e biscoitos.  Alimentos fritos. QUAIS ALIMENTOS POSSO INGERIR? Coma alimentos ricos em nutrientes, que iro nutrir seu corpo e mant-lo saudvel. Os alimentos que voc deve ingerir tambm dependem de vrios fatores, incluindo:  As calorias de que voc necessita.  Os medicamentos que toma.  Seu peso.  Seu nvel de glicemia sangunea.  Seu nvel de presso arterial.  Seu nvel de colesterol. Voc deve comer uma variedade de alimentos, incluindo:  Protena. ? Cortes magros de carne. ? Protenas pobres em gorduras saturadas, como  peixe, clara de ovo e feijes. Evite carnes processadas.  Frutas e vegetais. ? Frutas e vegetais que podem ajudar a  controlar os nveis de glicose sangunea, como ma, [place] e batata-doce.  Laticnios. ? Escolha laticnios desnatados ou pobres em gordura, como leite, iogurte e queijo.  [place] DomeGros, pes, massas e arroz. ? Escolha produtos integrais, como pes com diversos cereais, aveia integral e arroz integral. Esses alimentos podem ajudar a controlar a presso arterial.  Gorduras. ? Alimentos contendo gorduras saudveis, como [place], abacate, azeite de [place], leo de canola e peixe. TODAS AS PESSOAS COM DIABETES MELLITUS TM O MESMO PLANO DE ALIMENTAO? Pelo fato de cada pessoa com diabetes mellitus ser diferente, no h um plano de alimentao que funcione para todos.  muito importante que voc consulte um nutricionista que ir ajud-lo a criar um plano de alimentao adequado para voc. Estas informaes no se destinam a substituir as recomendaes de seu mdico. No deixe de discutir quaisquer dvidas com seu mdico. Document Released: 05/20/2015 Document Revised: 05/20/2015 Document Reviewed: 12/23/2012 Elsevier Interactive Patient Education  2017 Elsevier Inc.   Influenza Virus Vaccine injection (Fluarix) What is this medicine? INFLUENZA VIRUS VACCINE (in floo EN zuh VAHY ruhs vak SEEN) helps to reduce the risk of getting influenza also known as the flu. This medicine may be used for other purposes; ask your health care provider or pharmacist if you have questions. COMMON BRAND NAME(S): Fluarix, Fluzone What should I tell my health care provider before I take this medicine? They need to know if you have any of these conditions: -bleeding disorder like hemophilia -fever or infection -Guillain-Barre syndrome or other neurological problems -immune system problems -infection with the human immunodeficiency virus (HIV) or AIDS -low blood platelet counts -multiple sclerosis -an unusual or allergic reaction to influenza virus vaccine, eggs, chicken proteins, latex, gentamicin, other medicines,  foods, dyes or preservatives -pregnant or trying to get pregnant -breast-feeding How should I use this medicine? This vaccine is for injection into a muscle. It is given by a health care professional. A copy of Vaccine Information Statements will be given before each vaccination. Read this sheet carefully each time. The sheet may change frequently. Talk to your pediatrician regarding the use of this medicine in children. Special care may be needed. Overdosage: If you think you have taken too much of this medicine contact a poison control center or emergency room at once. NOTE: This medicine is only for you. Do not share this medicine with others. What if I miss a dose? This does not apply. What may interact with this medicine? -chemotherapy or radiation therapy -medicines that lower your immune system like etanercept, anakinra, infliximab, and adalimumab -medicines that treat or prevent blood clots like warfarin -phenytoin -steroid medicines like prednisone or cortisone -theophylline -vaccines This list may not describe all possible interactions. Give your health care provider a list of all the medicines, herbs, non-prescription drugs, or dietary supplements you use. Also tell them if you smoke, drink alcohol, or use illegal drugs. Some items may interact with your medicine. What should I watch for while using this medicine? Report any side effects that do not go away within 3 days to your doctor or health care professional. Call your health care provider if any unusual symptoms occur within 6 weeks of receiving this vaccine. You may still catch the flu, but the illness is not usually as bad. You cannot get the flu from the vaccine. The vaccine will not protect against colds or other illnesses that may  cause fever. The vaccine is needed every year. What side effects may I notice from receiving this medicine? Side effects that you should report to your doctor or health care professional as  soon as possible: -allergic reactions like skin rash, itching or hives, swelling of the face, lips, or tongue Side effects that usually do not require medical attention (report to your doctor or health care professional if they continue or are bothersome): -fever -headache -muscle aches and pains -pain, tenderness, redness, or swelling at site where injected -weak or tired This list may not describe all possible side effects. Call your doctor for medical advice about side effects. You may report side effects to FDA at 1-800-FDA-1088. Where should I keep my medicine? This vaccine is only given in a clinic, pharmacy, doctor's office, or other health care setting and will not be stored at home. NOTE: This sheet is a summary. It may not cover all possible information. If you have questions about this medicine, talk to your doctor, pharmacist, or health care provider.  2018 Elsevier/Gold Standard (2007-08-24 09:30:40)

## 2016-12-18 NOTE — Progress Notes (Signed)
Subjective:  Patient ID: Margaret Pace, female    DOB: 09/19/1965  Age: 51 y.o. MRN: [id number]  CC: Diabetes mellitus  HPI Margaret Pace is a 51 year old female with a history of Type 2 DM (A1c 10.1 which is up from 8.3) who presents For a follow-up visit.  I have reviewed her medications and she has been compliant with metformin and Marcelline DeistFarxiga however she does not have glipizide among her medications - states that is all she received from the pharmacy. Her A1c has trended up from 8.3 and is now 10.1. She denies hypoglycemia or numbness in extremities and is not up-to-date on annual eye exam She does not exercise regularly and has not been compliant with a diabetic diet as she has a daughter who has been in and out of the hospital frequently which has been a source of stress for her.  She takes atorvastatin for primary prevention of cardiovascular disease and denies any myalgias from it. She has no other concerns today.  Past Medical History:  Diagnosis Date  . Anemia    Pt reported blood transfusion after left leg fracture.  . Carpal tunnel syndrome, bilateral   . Diabetes mellitus without complication [facility](HCC)     Past Surgical History:  Procedure Laterality Date  . FRACTURE SURGERY       No Known Allergies   Outpatient Medications Prior to Visit  Medication Sig Dispense Refill  . Blood Glucose Monitoring Suppl (TRUE METRIX METER) DEVI 1 each by Does not apply route 3 (three) times daily before meals. 1 Device 0  . glucose blood (TRUE METRIX BLOOD GLUCOSE TEST) test strip Use three times daily 100 each 12  . polyethylene glycol-electrolytes (TRILYTE) 420 g solution Take 4,000 mLs by mouth as directed. 4000 mL 0  . TRUEPLUS LANCETS 28G MISC 1 each by Does not apply route 3 (three) times daily before meals. 100 each 12  . atorvastatin (LIPITOR) 40 MG tablet Take 1 tablet (40 mg total) by mouth daily. 30 tablet 6  . dapagliflozin propanediol (FARXIGA) 10 MG TABS tablet Take 10 mg  by mouth daily. 30 tablet 6  . glipiZIDE (GLUCOTROL) 10 MG tablet Take 1 tablet (10 mg total) by mouth 2 (two) times daily before a meal. 60 tablet 6  . metFORMIN (GLUCOPHAGE) 1000 MG tablet Take 1 tablet (1,000 mg total) by mouth 2 (two) times daily with a meal. 60 tablet 6  . naproxen (NAPROSYN) 500 MG tablet TAKE 1 TABLET BY MOUTH 2 TIMES DAILY WITH A MEAL. 60 tablet 2   No facility-administered medications prior to visit.     ROS Review of Systems  Constitutional: Negative for activity change, appetite change and fatigue.  HENT: Negative for congestion, sinus pressure and sore throat.   Eyes: Negative for visual disturbance.  Respiratory: Negative for cough, chest tightness, shortness of breath and wheezing.   Cardiovascular: Negative for chest pain and palpitations.  Gastrointestinal: Negative for abdominal distention, abdominal pain and constipation.  Endocrine: Negative for polydipsia.  Genitourinary: Negative for dysuria and frequency.  Musculoskeletal: Negative for arthralgias and back pain.  Skin: Negative for rash.  Neurological: Negative for tremors, light-headedness and numbness.  Hematological: Does not bruise/bleed easily.  Psychiatric/Behavioral: Negative for agitation and behavioral problems.    Objective:  BP 135/76 (BP Location: Left Arm, Patient Position: Sitting, Cuff Size: Normal)   Pulse 65   Temp 97.9 F (36.6 C) (Oral)   Resp 18   Ht 5\' 2"  (1.575 m)  Wt 160 lb 6.4 oz (72.8 kg)   LMP 11/12/2013   SpO2 99%   BMI 29.34 kg/m   BP/Weight 12/18/2016 09/08/2016 08/20/2016  Systolic BP 135 107 132  Diastolic BP 76 71 76  Wt. (Lbs) 160.4 158 150  BMI 29.34 28.9 26.57      Physical Exam  Constitutional: She is oriented to person, place, and time. She appears well-developed and well-nourished.  Cardiovascular: Normal rate, normal heart sounds and intact distal pulses.  No murmur heard. Pulmonary/Chest: Effort normal and breath sounds normal. She has no  wheezes. She has no rales. She exhibits no tenderness.  Abdominal: Soft. Bowel sounds are normal. She exhibits no distension and no mass. There is no tenderness.  Musculoskeletal: Normal range of motion.  Neurological: She is alert and oriented to person, place, and time.  Skin: Skin is warm and dry.  Psychiatric: She has a normal mood and affect.     CMP Latest Ref Rng & Units 07/29/2016 04/14/2016 09/25/2015  Glucose 65 - 99 mg/dL 81 454(U) 981(X)  BUN 6 - 20 mg/dL 12 17 12   Creatinine 0.44 - 1.00 mg/dL 9.14 7.82 9.56  Sodium 135 - 145 mmol/L 137 139 140  Potassium 3.5 - 5.1 mmol/L 4.1 4.6 4.5  Chloride 101 - 111 mmol/L 105 104 106  CO2 22 - 32 mmol/L 23 24 24   Calcium 8.9 - 10.3 mg/dL 9.2 9.7 9.6  Total Protein 6.5 - 8.1 g/dL 7.2 7.6 7.3  Total Bilirubin 0.3 - 1.2 mg/dL 0.6 0.5 0.3  Alkaline Phos 38 - 126 U/L 123 120 145(H)  AST 15 - 41 U/L 30 21 16   ALT 14 - 54 U/L 20 22 18     Lipid Panel     Component Value Date/Time   CHOL 114 04/14/2016 0928   TRIG 120 04/14/2016 0928   HDL 35 (L) 04/14/2016 0928   CHOLHDL 3.3 04/14/2016 0928   VLDL 45 (H) 09/25/2015 1144   LDLCALC 91 09/25/2015 1144    Lab Results  Component Value Date   HGBA1C 10.7 12/18/2016    Assessment & Plan:   1. Type 2 diabetes mellitus without complication, without long-term current use of insulin (HCC) Uncontrolled with A1c of 10.7 which has trended up from 8.3 This could be secondary to not having glipizide which I have refilled If still uncontrolled at next visit and will add Lantus Encourage adherence with a diabetic diet Keep blood sugar logs with fasting goals of 80-120 mg/dl, random of less than 213 and in the event of sugars less than 60 mg/dl or greater than 086 mg/dl please notify the clinic ASAP. It is recommended that you undergo annual eye exams and annual foot exams. Pneumovax is recommended every 5 years before the age of 38 and once for a lifetime at or after the age of 92. - Glucose  (CBG) - HgB A1c - atorvastatin (LIPITOR) 40 MG tablet; Take 1 tablet (40 mg total) daily by mouth.  Dispense: 30 tablet; Refill: 6 - glipiZIDE (GLUCOTROL) 10 MG tablet; Take 1 tablet (10 mg total) 2 (two) times daily before a meal by mouth.  Dispense: 60 tablet; Refill: 6 - metFORMIN (GLUCOPHAGE) 1000 MG tablet; Take 1 tablet (1,000 mg total) 2 (two) times daily with a meal by mouth.  Dispense: 60 tablet; Refill: 6 - dapagliflozin propanediol (FARXIGA) 10 MG TABS tablet; Take 10 mg daily by mouth.  Dispense: 30 tablet; Refill: 6  2. Right-sided low back pain without sciatica, unspecified chronicity Stable -  naproxen (NAPROSYN) 500 MG tablet; TAKE 1 TABLET BY MOUTH 2 TIMES DAILY WITH A MEAL.  Dispense: 60 tablet; Refill: 2  3. Need for influenza vaccination - Flu Vaccine QUAD 6+ mos PF IM (Fluarix Quad PF)  4. Immunization due   Meds ordered this encounter  Medications  . atorvastatin (LIPITOR) 40 MG tablet    Sig: Take 1 tablet (40 mg total) daily by mouth.    Dispense:  30 tablet    Refill:  6  . glipiZIDE (GLUCOTROL) 10 MG tablet    Sig: Take 1 tablet (10 mg total) 2 (two) times daily before a meal by mouth.    Dispense:  60 tablet    Refill:  6  . metFORMIN (GLUCOPHAGE) 1000 MG tablet    Sig: Take 1 tablet (1,000 mg total) 2 (two) times daily with a meal by mouth.    Dispense:  60 tablet    Refill:  6  . dapagliflozin propanediol (FARXIGA) 10 MG TABS tablet    Sig: Take 10 mg daily by mouth.    Dispense:  30 tablet    Refill:  6    Discontinue previous dose  . naproxen (NAPROSYN) 500 MG tablet    Sig: TAKE 1 TABLET BY MOUTH 2 TIMES DAILY WITH A MEAL.    Dispense:  60 tablet    Refill:  2    Follow-up: Return in about 3 months (around 03/20/2017) for follow up of Diabetes mellitus.   Jaclyn ShaggyEnobong Amao MD

## 2016-12-24 ENCOUNTER — Ambulatory Visit (HOSPITAL_COMMUNITY): Admit: 2016-12-24 | Payer: Self-pay | Admitting: Internal Medicine

## 2016-12-24 ENCOUNTER — Encounter (HOSPITAL_COMMUNITY): Payer: Self-pay

## 2016-12-24 SURGERY — COLONOSCOPY
Anesthesia: Moderate Sedation

## 2016-12-25 ENCOUNTER — Other Ambulatory Visit: Payer: Self-pay | Admitting: Obstetrics and Gynecology

## 2016-12-25 DIAGNOSIS — Z1231 Encounter for screening mammogram for malignant neoplasm of breast: Secondary | ICD-10-CM
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
## 2017-01-28 ENCOUNTER — Other Ambulatory Visit: Payer: Self-pay | Admitting: Obstetrics and Gynecology

## 2017-01-28 ENCOUNTER — Ambulatory Visit
Admission: RE | Admit: 2017-01-28 | Discharge: 2017-01-28 | Disposition: A | Discharge status: Home / Self Care | Payer: No Typology Code available for payment source | Source: Ambulatory Visit | Attending: Obstetrics and Gynecology | Admitting: Obstetrics and Gynecology

## 2017-01-28 ENCOUNTER — Encounter (HOSPITAL_COMMUNITY): Payer: Self-pay

## 2017-01-28 ENCOUNTER — Ambulatory Visit (HOSPITAL_COMMUNITY)
Admission: RE | Admit: 2017-01-28 | Discharge: 2017-01-28 | Disposition: A | Discharge status: Home / Self Care | Payer: Self-pay | Source: Ambulatory Visit | Attending: Obstetrics and Gynecology | Admitting: Obstetrics and Gynecology

## 2017-01-28 VITALS — BP 108/60 | Temp 98.2°F | Ht 62.0 in | Wt 156.4 lb

## 2017-01-28 DIAGNOSIS — Z1231 Encounter for screening mammogram for malignant neoplasm of breast: Secondary | ICD-10-CM

## 2017-01-28 DIAGNOSIS — Z1239 Encounter for other screening for malignant neoplasm of breast: Secondary | ICD-10-CM

## 2017-01-28 NOTE — Progress Notes (Signed)
No complaints today.   Pap Smear: Pap smear not completed today. [facility] and normal with positive HPV. Per patient has no history of an abnormal Pap smear. Last Pap smear result is in Epic.  Physical exam: Breasts Breasts symmetrical. No skin abnormalities bilateral breasts. No nipple retraction bilateral breasts. No nipple discharge bilateral breasts. No lymphadenopathy. No lumps palpated bilateral breasts. No complaints of pain or tenderness on exam. [facility]. Appointment scheduled for Thursday, January 28, 2017 at 1040.        Pelvic/Bimanual No Pap smear completed today since last Pap smear and HPV typing was 09/08/2016. Pap smear not indicated per [facility] guidelines.   Smoking History: Patient has never smoked.  Patient Navigation: Patient education provided. [facility]. Spanish interpreter provided  Colorectal Cancer Screening: Per patient has never had a colonoscopy completed. No complaints today. FIT Test given to patient to complete and return to [facility].  Used Spanish interpreter [employer]rika McReynolds from [place].

## 2017-01-28 NOTE — Patient Instructions (Signed)
Explained breast self awareness with [name]. Patient did not need a Pap smear today due to last Pap smear and HPV typing was 09/08/2016. Let patient know that her next Pap smear is due in July 2018 due to her last Pap smear being positive for HPV. [facility]. [facility]. Appointment scheduled for Thursday, January 28, 2017 at 1040. Let patient know the Breast Center will follow up with her within the next couple weeks with results of mammogram by letter or phone. [name] verbalized understanding.  Laterrian Hevener, Kathaleen Maserhristine Poll, RN 9:37 AM

## 2017-01-29 ENCOUNTER — Encounter (HOSPITAL_COMMUNITY): Payer: Self-pay | Admitting: *Deleted

## 2017-02-08 ENCOUNTER — Encounter (HOSPITAL_COMMUNITY): Payer: Self-pay | Admitting: *Deleted

## 2017-02-08 LAB — FECAL OCCULT BLOOD, IMMUNOCHEMICAL: FECAL OCCULT BLD: NEGATIVE

## 2017-02-08 NOTE — Progress Notes (Signed)
Letter mailed to patient with negative Fit Test results.  

## 2017-03-22 ENCOUNTER — Ambulatory Visit: Payer: Self-pay | Admitting: Family Medicine

## 2017-03-23 ENCOUNTER — Ambulatory Visit: Payer: Self-pay | Attending: Family Medicine | Admitting: Family Medicine

## 2017-03-23 ENCOUNTER — Encounter: Payer: Self-pay | Admitting: Family Medicine

## 2017-03-23 VITALS — BP 132/75 | HR 69 | Temp 97.4°F | Ht 62.0 in | Wt 157.4 lb

## 2017-03-23 DIAGNOSIS — Z7984 Long term (current) use of oral hypoglycemic drugs: Secondary | ICD-10-CM | POA: Insufficient documentation

## 2017-03-23 DIAGNOSIS — E119 Type 2 diabetes mellitus without complications: Secondary | ICD-10-CM | POA: Insufficient documentation

## 2017-03-23 DIAGNOSIS — F4321 Adjustment disorder with depressed mood: Secondary | ICD-10-CM | POA: Insufficient documentation

## 2017-03-23 DIAGNOSIS — Z8781 Personal history of (healed) traumatic fracture: Secondary | ICD-10-CM | POA: Insufficient documentation

## 2017-03-23 DIAGNOSIS — Z79899 Other long term (current) drug therapy: Secondary | ICD-10-CM | POA: Insufficient documentation

## 2017-03-23 DIAGNOSIS — E0800 Diabetes mellitus due to underlying condition with hyperosmolarity without nonketotic hyperglycemic-hyperosmolar coma (NKHHC): Secondary | ICD-10-CM

## 2017-03-23 LAB — GLUCOSE, POCT (MANUAL RESULT ENTRY): POC Glucose: 83 mg/dl (ref 70–99)

## 2017-03-23 MED ORDER — TRUEPLUS LANCETS 28G MISC: 1.0000 | Freq: Three times a day (TID) | 12 refills | Status: AC

## 2017-03-23 MED ORDER — CANAGLIFLOZIN 100 MG PO TABS: 100.0000 mg | ORAL_TABLET | Freq: Every day | ORAL | 3 refills | Status: DC

## 2017-03-23 MED ORDER — METFORMIN HCL 1000 MG PO TABS: 1000.0000 mg | ORAL_TABLET | Freq: Two times a day (BID) | ORAL | 6 refills | Status: DC

## 2017-03-23 MED ORDER — DAPAGLIFLOZIN PROPANEDIOL 10 MG PO TABS: 10.0000 mg | ORAL_TABLET | Freq: Every day | ORAL | 6 refills | Status: DC

## 2017-03-23 MED ORDER — ATORVASTATIN CALCIUM 40 MG PO TABS: 40.0000 mg | ORAL_TABLET | Freq: Every day | ORAL | 6 refills | Status: DC

## 2017-03-23 MED ORDER — GLIPIZIDE 5 MG PO TABS: 5.0000 mg | ORAL_TABLET | Freq: Two times a day (BID) | ORAL | 3 refills | Status: DC

## 2017-03-23 NOTE — Progress Notes (Signed)
Subjective:  Patient ID: Margaret Pace, female    DOB: 31-Oct-1965  Age: 52 y.o. MRN: [id number]  CC: Diabetes   HPI Margaret Pace is a 52 year old Hispanic female with a PMH of T2DM that presents today for follow up.  Upon entering the room, she seems very saddened. She explains that her son was taken by immigration on Saturday morning, on his way to work. She is anxious, she doesn't know when she will get to see him again because if she visits him, she will be taken as will. She is the only caregiver for her daughter and is afraid that she will have no one if she had to leave.    She reports compliance with medications. She has improved her diet and is avoiding sodas and sugary foods. She has not been consistently checking her blood sugar at home because she ran out of lancets but her last reading was 45 at 12pm. She admits to hypoglycemic events that occur anytime of the day and will wake her up at night. She denies changes in vision and numbness or tingling of extremities. In Nov, she did her eye exam at [facility] and was told that she was beginning to develop cataracts. She was prescribed reading glasses. She denies chest pain, edema, or SOB at this time.  Today, she is requesting refills on all her medications.    Past Medical History:  Diagnosis Date  . Anemia    Pt reported blood transfusion after left leg fracture.  . Carpal tunnel syndrome, bilateral   . Diabetes mellitus without complication [facility])    Past Surgical History:  Procedure Laterality Date  . APPLICATION OF WOUND VAC Left 05/14/2014   Procedure: APPLICATION OF WOUND VAC;  Surgeon: Altamese [place], MD;  Location: [place];  Service: Orthopedics;  Laterality: Left;  . FASCIOTOMY Left 05/14/2014   Procedure: FOUR COMPARTMENT FASCIOTOMY;  Surgeon: Altamese Casey, MD;  Location: [place];  Service: Orthopedics;  Laterality: Left;  . FRACTURE SURGERY    . HARDWARE REMOVAL Left 10/19/2014   Procedure: HARDWARE REMOVAL LEFT TIBIA AND  ANKLE;  Surgeon: Altamese Clearwater, MD;  Location: [place];  Service: Orthopedics;  Laterality: Left;  . SECONDARY CLOSURE OF WOUND Left 05/17/2014   Procedure: WOUND CLOSURE LEFT LEG ;  Surgeon: Altamese Osage, MD;  Location: [place];  Service: Orthopedics;  Laterality: Left;  . TIBIA IM NAIL INSERTION Left 05/14/2014   Procedure: INTRAMEDULLARY (IM) NAIL TIBIAL;  Surgeon: Altamese Hartman, MD;  Location: [place];  Service: Orthopedics;  Laterality: Left;   No Known Allergies   Outpatient Medications Prior to Visit  Medication Sig Dispense Refill  . Blood Glucose Monitoring Suppl (TRUE METRIX METER) DEVI 1 each by Does not apply route 3 (three) times daily before meals. 1 Device 0  . glucose blood (TRUE METRIX BLOOD GLUCOSE TEST) test strip Use three times daily 100 each 12  . naproxen (NAPROSYN) 500 MG tablet TAKE 1 TABLET BY MOUTH 2 TIMES DAILY WITH A MEAL. 60 tablet 2  . atorvastatin (LIPITOR) 40 MG tablet Take 1 tablet (40 mg total) daily by mouth. 30 tablet 6  . dapagliflozin propanediol (FARXIGA) 10 MG TABS tablet Take 10 mg daily by mouth. 30 tablet 6  . glipiZIDE (GLUCOTROL) 10 MG tablet Take 1 tablet (10 mg total) 2 (two) times daily before a meal by mouth. 60 tablet 6  . metFORMIN (GLUCOPHAGE) 1000 MG tablet Take 1 tablet (1,000 mg total) 2 (two) times daily with a  meal by mouth. 60 tablet 6  . TRUEPLUS LANCETS 28G MISC 1 each by Does not apply route 3 (three) times daily before meals. 100 each 12  . polyethylene glycol-electrolytes (TRILYTE) 420 g solution Take 4,000 mLs by mouth as directed. (Patient not taking: Reported on 01/28/2017) 4000 mL 0   No facility-administered medications prior to visit.     ROS Review of Systems  Constitutional: Negative.   Eyes: Negative for visual disturbance.  Respiratory: Negative for cough and shortness of breath.   Cardiovascular: Negative for chest pain.  Endocrine: Positive for polyphagia (during hypoglycemic events.).  Neurological: Negative for  syncope, numbness and headaches.  Psychiatric/Behavioral: The patient is nervous/anxious.     Objective:  BP 132/75   Pulse 69   Temp (!) 97.4 F (36.3 C) (Oral)   Ht '5\' 2"'  (1.575 m)   Wt 157 lb 6.4 oz (71.4 kg)   LMP 11/12/2013   SpO2 99%   BMI 28.79 kg/m   BP/Weight 03/23/2017 01/28/2017 30/02/6008  Systolic BP 932 355 732  Diastolic BP 75 60 76  Wt. (Lbs) 157.4 156.4 160.4  BMI 28.79 28.61 29.34   Lab Results  Component Value Date   POCGLU 83 03/23/2017   POCGLU 176 (A) 12/18/2016   POCGLU 82 07/15/2016    Physical Exam  Constitutional: She is oriented to person, place, and time. She appears well-developed and well-nourished.  HENT:  Head: Normocephalic and atraumatic.  Neck: Normal range of motion.  Cardiovascular: Normal rate, regular rhythm and normal heart sounds.  Pulmonary/Chest: Effort normal and breath sounds normal. No respiratory distress.  Musculoskeletal: Normal range of motion.  Neurological: She is alert and oriented to person, place, and time.  Psychiatric: Her behavior is normal. She exhibits a depressed mood.     Assessment & Plan:   1. Type 2 diabetes mellitus without complication, without long-term current use of insulin (HCC) Previously uncontrolled with a A1c of 10.1 Decreased glipizide from 65m to 570mBID due to recurrent hypoglycemia.  Encouraged continuity of life style changes. - POCT glucose (manual entry) - Hemoglobin A1c; Future - CMP14+EGFR; Future - Lipid panel; Future - Microalbumin/Creatinine Ratio, Urine; Future - glipiZIDE (GLUCOTROL) 5 MG tablet; Take 1 tablet (5 mg total) by mouth 2 (two) times daily before a meal.  Dispense: 60 tablet; Refill: 3 - metFORMIN (GLUCOPHAGE) 1000 MG tablet; Take 1 tablet (1,000 mg total) by mouth 2 (two) times daily with a meal.  Dispense: 60 tablet; Refill: 6 - dapagliflozin propanediol (FARXIGA) 10 MG TABS tablet; Take 10 mg by mouth daily.  Dispense: 30 tablet; Refill: 6 - TRUEPLUS LANCETS  28G MISC; 1 each by Does not apply route 3 (three) times daily before meals.  Dispense: 100 each; Refill: 12 - atorvastatin (LIPITOR) 40 MG tablet; Take 1 tablet (40 mg total) by mouth daily.  Dispense: 30 tablet; Refill: 6  2. Situational depression Discussed coping mechanisms. Encouraged patient to continue to take care of her self and her health.    Meds ordered this encounter  Medications  . glipiZIDE (GLUCOTROL) 5 MG tablet    Sig: Take 1 tablet (5 mg total) by mouth 2 (two) times daily before a meal.    Dispense:  60 tablet    Refill:  3    Discontinue previous dose  . metFORMIN (GLUCOPHAGE) 1000 MG tablet    Sig: Take 1 tablet (1,000 mg total) by mouth 2 (two) times daily with a meal.    Dispense:  60 tablet  Refill:  6  . dapagliflozin propanediol (FARXIGA) 10 MG TABS tablet    Sig: Take 10 mg by mouth daily.    Dispense:  30 tablet    Refill:  6    Discontinue previous dose  . TRUEPLUS LANCETS 28G MISC    Sig: 1 each by Does not apply route 3 (three) times daily before meals.    Dispense:  100 each    Refill:  12  . atorvastatin (LIPITOR) 40 MG tablet    Sig: Take 1 tablet (40 mg total) by mouth daily.    Dispense:  30 tablet    Refill:  6    Follow-up: Return in about 3 months (around 06/20/2017) for Follow-up of chronic medical conditions.   Jonnie Kind DNP student

## 2017-03-23 NOTE — Patient Instructions (Signed)
*La Doctora cambio su medicamento llamado Glipizide a 41m dos veces al dia.    Diabetes mellitus y nutricin Diabetes Mellitus and Nutrition Si sufre de diabetes (diabetes mellitus), es muy importante tener hbitos alimenticios saludables debido a que sus niveles de aDesigner, television/film setsangre (glucosa) se ven afectados en gran medida por lo que come y bebe. [place] alimentos saludables en las cantidades aBroadview Heights aproximadamente a la mUnited Technologies Corporation lColoradoayudar a:  CAeronautical engineerglucemia.  Disminuir el riesgo de sufrir una enfermedad cardaca.  Mejorar la presin arterial.  AScience writero mantener un peso saludable.  Todas las personas que sufren de diabetes son diferentes y cada una tiene necesidades diferentes en cuanto a un plan de alimentacin. El mdico puede recomendarle que trabaje con un especialista en dietas y nutricin (nutricionista) para eFinancial traderplan para usted. Su plan de alimentacin puede variar segn factores como:  Las caloras que necesita.  Los medicamentos que toma.  Su peso.  Sus niveles de glucemia, presin arterial y colesterol.  Su nivel de aSamoa  Otras afecciones que tenga, como enfermedades cardacas o renales.  Cmo me afectan los carbohidratos? Los carbohidratos afectan el nivel de glucemia ms que cualquier otro tipo de alimento. La ingesta de carbohidratos naturalmente aumenta la cantidad glucosa en la sangre. El recuento de carbohidratos es un mtodo destinado a lCatering managerun registro de la cantidad de carbohidratos que se ingieren. El recuento de carbohidratos es importante para mTheatre managerla glucemia a un nivel saludable, en especial si utiliza insulina o toma determinados medicamentos por va oral para la diabetes. Es importante saber la cantidad de carbohidratos que se pueden ingerir en cada comida sin correr nEngineer, manufacturing Esto es dPsychologist, forensic El nutricionista puede ayudarlo a calcular la cantidad de carbohidratos que debe  ingerir en cada comida y colacin. Los alimentos que contienen carbohidratos incluyen:  Pan, cereal, arroz, pasta y galletas.  Papas y maz.  Guisantes, frijoles y lentejas.  Leche y yEstate agent  FLambert Modyy jMicronesia  Postres, como pasteles, galletitas, helado y caramelos.  Cmo me afecta el alcohol? El alcohol puede provocar disminuciones sbitas de la glucemia (hipoglucemia), en especial si utiliza insulina o toma determinados medicamentos por va oral para la diabetes. La hipoglucemia es una afeccin potencialmente mortal. Los sntomas de la hipoglucemia (somnolencia, mareos y confusin) son similares a los sntomas de haber consumido demasiado alcohol. Si el mdico afirma que el alcohol es seguro para usted, siga estas pautas:  Limite el consumo de alcohol a no ms de 1 medida por da si es mujer y no est eMusic therapist y a 2 medidas si es hombre. Una medida equivale a 12oz (3573m de cerveza, 5oz (14884mde vino o 1oz (75m10me bebidas de alta graduacin alcohlica.  No beba con el estmago vaco.  Mantngase hidratado con agua, gaseosas dietticas o t helado sin azcar.  Tenga en cuenta que las gaseosas comunes, los jugos y otros refrescos pueden contener mucha azcar y se deben contar como carbohidratos.  Consejos para seguir estePhotographer etiquetas de los alimentos  Comience por controlar el tamao de la porcin en la etiqueta. La cantidad de caloras, carbohidratos, grasas y otros nutrientes mencionados en la etiqueta se basan en una porcin del alimento. Muchos alimentos contienen ms de una porcin por envase.  Verifique la cantidad total de gramos (g) de carbohidratos totales en una porcin. Puede calcular la cantidad de porciones de carbohidratos al dividir el total de carbohidratos por 15.  Por ejemplo, si un alimento posee un total de 30g de carbohidratos, equivale a 2 porciones de carbohidratos.  Verifique la cantidad de gramos (g) de grasas saturadas y grasas trans  en una porcin. Escoja alimentos que no contengan grasa o que tengan un bajo contenido.  Controle la cantidad de miligramos (mg) de sodio en una porcin. La State Farm de las personas deben limitar la ingesta de sodio total a menos de 237m por dTraining and development officer  Siempre consulte la informacin nutricional de los alimentos etiquetados como "con bajo contenido de grasa" o "sin grasa". Estos alimentos pueden ser ms altos en azcar agregada o en carbohidratos refinados y deben evitarse.  Hable con el nutricionista para identificar sus objetivos diarios en cuanto a los nutrientes mencionados en la etiqueta. De compras  Evite comprar alimentos procesados, enlatados o prehechos. Estos alimentos tienden a tTEFL teachercantidad de gHaswell sodio y azcar agregada.  Compre en la zona exterior de la tienda de comestibles. Esta incluye frutas y vNorthrop Grumman granos a granel, carnes frescas y productos lcteos frescos. Coccin  Utilice mtodos de coccin a baja temperatura, como hornear, en lugar de mtodos de coccin a alta temperatura, como frer en abundante aceite.  Cocine con aceites saludables, como el aceite de oRocky Comfort canola o gMontrose  Evite cocinar con manteca, crema o carnes con alto contenido de grasa. Planificacin de las comidas  CInternational Papercomidas y las colaciones de forma regular, preferentemente a la misma hora todos lLake Delton Evite pasar largos perodos de tiempo sin comer.  Consuma alimentos ricos en fibra, como frutas frescas, verduras, frijoles y cereales integrales. Consulte al nutricionista sobre cuntas porciones de carbohidratos puede consumir en cada comida.  Consuma entre 4 y 6 onzas de protenas magras por da, como carnes mUtica pollo, pescado, hFirst Data Corporationo tofu. 1 onza equivale a 1 onza de carne, pollo o pescado, 1 huevo, o 1/4 taza de tofu.  Coma algunos alimentos por da que contengan grasas saludables, como aguacates, frutos secos, semillas y pescado. Estilo de vida   Controle  su nivel de glucemia con regularidad.  Haga ejercicio al menos 374mutos, 5das o ms por semana, o como se lo haya indicado el mdico.  Tome los meTenneco Ince lo haya indicado el mdico.  No consuma ningn producto que contenga nicotina o tabaco, como cigarrillos y ciPsychologist, sport and exerciseSi necesita ayuda para dejar de fumar, consulte al mdHess Corporationon un asesor o instructor en diabetes para identificar estrategias para controlar el estrs y cualquier desafo emocional y social. Cules son algunas de las preguntas que puedo hacerle a mi mdico?  Es necesario que me rena con unRadio broadcast assistantn diabetes?  Es necesario que me rena con un nutricionista?  A qu nmero puedo llamar si tengo preguntas?  Cules son los mejores momentos para controlar la glucemia? Dnde encontrar ms informacin:  Asociacin Americana de la Diabetes (American Diabetes Association): diabetes.org/food-and-fitness/food  Academia de Nutricin y DiInformation systems managerAcademy of Nutrition and Dietetics): wwPokerClues.dkInPickensiabetes y laBethesda ReWater quality scientistNHot Springs Rehabilitation Centerf Diabetes and Digestive and Kidney Diseases) (InDonnelsvilleNIH): wwContactWire.beesumen  Un plan de alimentacin saludable lo ayudar a coAeronautical engineerlucemia y maTheatre managern estilo de vida saludable.  Trabajar con un especialista en dietas y nutricin (nutricionista) puede ayudarlo a elInsurance claims handlere alimentacin para usted.  Tenga en cuenta que los carbohidratos y el alcohol tienen efectos inmediatos en sus niveles de glucemia. Es imSystems developer  carbohidratos y consumir alcohol con prudencia. Esta informacin no tiene Marine scientist el consejo del mdico. Asegrese de hacerle al mdico cualquier pregunta que tenga. Document Released:  05/05/2007 Document Revised: 05/18/2016 Document Reviewed: 05/18/2016 Elsevier Interactive Patient Education  2018 [place] (Hypoglycemia) La hipoglucemia se produce cuando el nivel de azcar (glucosa) en la sangre es demasiado bajo. La glucosa es un tipo de azcar que constituye la principal fuente de energa del cuerpo. Determinadas hormonas, como la insulina y el glucagn, controlan la glucemia. La insulina baja la glucemia, mientras que el glucagn la [place]. Si tiene demasiada insulina en el torrente sanguneo o si no ingiere la cantidad suficiente de alimentos que contengan glucosa, puede tener hipoglucemia. La hipoglucemia puede ocurrir World [employer] Services Corporation con o sin diabetes. Puede desarrollarse rpidamente y ser una emergencia mdica. CAUSAS La hipoglucemia ocurre con ms frecuencia en las personas que tienen diabetes. Si tiene diabetes, las causas de la hipoglucemia pueden ser las siguientes:  Medicamentos para la diabetes.  No comer lo suficiente o no comer con la frecuencia suficiente.  Aumento de la actividad fsica.  Tomar alcohol, especialmente si no ha comido recientemente. Si no tiene diabetes, las causas de la hipoglucemia pueden ser las siguientes:  Un tumor en el pncreas. El pncreas es el rgano que produce la insulina.  No comer lo suficiente o no comer durante perodos largos (ayunar).  Una infeccin o enfermedad grave que afecta el hgado, el corazn o los riones.  Ciertos medicamentos. Usted tambin puede tener hipoglucemia reactiva. Esta afeccin causa hipoglucemia en el lapso de 4horas despus de haber ingerido un alimento. Puede ocurrir despus de someterse a una ciruga de Alpha. A veces, se desconoce la causa de la hipoglucemia reactiva. FACTORES DE RIESGO Es ms probable que la hipoglucemia se manifieste en las siguientes personas:  Las que tienen diabetes y toman medicamentos para bajar la glucemia.  Las que consumen alcohol en  exceso.  Las que tienen una enfermedad grave. SNTOMAS Es posible que la hipoglucemia no cause sntomas. Si tiene sntomas, estos pueden incluir los siguientes:  [place].  Ansiedad.  Sudoracin y piel hmeda.  Confusin.  Mareos o sensacin de desvanecimiento.  Somnolencia.  Nuseas.  Aumento de la frecuencia cardaca.  Dolor de [place].  Visin borrosa.  Convulsiones.  Pesadillas.  Hormigueo o adormecimiento alrededor Exxon Mobil Corporation, los labios o la [place].  Cambios en el habla.  Disminucin de la capacidad de concentracin.  Cambios en la coordinacin.  Sueo agitado.  Temblores o sacudidas.  Desmayos.  Irritabilidad. DIAGNSTICO La hipoglucemia se diagnostica con un anlisis de sangre para medir la glucemia. Este anlisis de sangre se realiza cuando tiene los sntomas. El mdico tambin puede hacerle un examen fsico y revisar sus antecedentes mdicos. Si no tiene diabetes, le podrn Optometrist otros estudios para Animator causa de la hipoglucemia. TRATAMIENTO A menudo, el tratamiento de esta afeccin consiste en ingerir de inmediato un alimento o una bebida que contengan glucosa, por ejemplo:  3o 4comprimidos de azcar (pastillas de glucosa).  Gel de glucosa, tubo de 15 gramos.  4onzas (160m) de jugo de frutas.  4onzas (1233m de gaseosa comn (no diettica).  4 onzas (12053mde lecUSG CorporationVarias unidades de caramelos duros.  1 cucharada de miel o azcar. Tratamiento de la hipoglucemia en los diabticos Si est alerta y puede tragar con seguridad, siga la regla de 15/15, que consiste en lo siguiente:  Tome 15gramos de hidratos de carbono de accin rpida. Las opciones de  accin rpida incluyen lo siguiente: ? 1pomo de glucosa en gel. ? 3comprimidos de glucosa. ? 6 a 8unidades de caramelos duros. ? 4onzas (137m) de jugo de frutas. ? 4onzas (1260m de gaseosa comn (no diettica).  Contrlese la glucemia 1577mtos  despus de ingerir el hidrato de carbono.  Si este nuevo nivel de glucosa en la sangre an es de 77m81m o menoGarment/textile technologist9mmo49m), vuelva a ingerir 15gramos de un hidrato de carbono.  Si la glucemia no aumenta por encima de 77mg/2m3,9mmol/74mdespus de 3intentos, solicite ayuda mdica de emergencia.  Ingiera una comida o una colacin en el transcurso de 1hora despus de que la glucemia se haya normalizado. Tratamiento de la hipoglucemia grave La hipoglucemia grave ocurre cuando la glucemia es igual o menor que 54mg/dl64mmol/l)55ma hipoglucemia grave es una emergEngineer, maintenance (IT)re hasta que los sntomas desaparezcan. Solicite atencin mdica de inmediato. Comunquese con el servicio de emergencias de su localidad (911 en los [place]). No conduzca por sus propios medios hasta el Principal Financiale hipoglucemia grave y no puede ingerir alimentos o bebidas, tal vez deba aplicarse una inyeccin de glucagn. Un familiar o un amigo cercano deben aprender a controlarle la glucemia y a aplicarle una inyeccin de glucagn. Pregntele al mdico si debe tener disponible un kit de inyecciones de glucagn de emergenciFreight forwarderble que la hipoglucemia grave deba tratarse en un hospital. El tratamiento puede incluir la administracin de glucosa a travs de una va intravenosa (IV). Tambin puede necesitar un tratamiento para tratar la afeccin que est causando la hipoglucemia. INSTRUCCIONES PARA EL CUIDADO EN EL HOGAR Instrucciones generales  Evite cualquier dieta que le impida ingerir la cantidad suficiente de comida. Hable con el mdico antes de comenzar una dieta nueva.  Tome los medicamentos de venta libre y los recetados solamente como se lo haya indicado el mdico.  Limite el consumo de alcohol a no ms de 1 medida por da si es mujer y no est embarazadStockdaleidas si es hombre. Una medida equivale a 12onzas de cerveza, 5onzas de vino o 1onzas de bebidas alcohlicas de alta  graduacin.  Concurra a todas las visitas de control como se lo haya indicado el mdico. Esto es importante. Si usted tiene diabetes:  Asegrese de conocer lAssurantpoglucemia.  Lleve siempre consigo una colacin de hidratos de carbono de accin rpida para tratar la glucemia baja.  Siga el plan de control de la diabetes como se lo haya indicado el mdico. Haga lo siguiente: ? Tome la mFinancial risk analystle indic. ? Siga el plan de ejercicio. ? Siga el plan de comidas. Coma a horario y no se saltee comidas. ? Contrlese la glucemia con la frecuencia que le hayan indicado. Contrlese la glucemia antes y despus de hacer actividad fsica. Si hace ejercicio durante ms tiempo o de manera diPeabody Energybitual, contrlese la glucemia con mayor frecuencia. ? Siga el plan para los das de enfermedad cuando no pueda comer o beber normalmente. Arme este plan por adelantado con el mdico.  Comparta su plan de control de la diabetes con sus compaeros de trabajo y de la escuelCytogeneticistas personas con las que convive. [place] Quinteroese las cetonas en la orina cuando est enfermo y como se lLely Resortya indicado el mdico.  Lleve una tarjeta de alerta mdica o use un brazalete o medalla de alerta mdica. Si tiene hipoglucemia reactiva o la glucemia baja debido a otras causas:  Contrlese el nivel  sanguneo de glucosa como se lo haya indicado el mdico.  Siga las indicaciones del mdico respecto de las restricciones para las comidas o las bebidas. SOLICITE ATENCIN MDICA SI:  Tiene problemas para mantener la glucemia dentro del rango ideal.  Tiene episodios frecuentes de hipoglucemia. SOLICITE ATENCIN MDICA DE INMEDIATO SI:  Contina teniendo sntomas de hipoglucemia despus de haber ingerido un alimento o una bebida que contengan glucosa.  La glucemia es igual o menor que 81m/dl (332ml/l).  Tiene una convulsin.  Se desmaya. Estos sntomas pueden representar un problema grave  que constituye unEngineer, maintenance (IT)No espere hasta que los sntomas desaparezcan. Solicite atencin mdica de inmediato. Comunquese con el servicio de emergencias de su localidad (911 en los [place]). No conduzca por sus propios medios haPrincipal FinancialEsta informacin no tiene coMarine scientistl consejo del mdico. Asegrese de hacerle al mdico cualquier pregunta que tenga. Document Released: 01/26/2005 Document Revised: 05/20/2015 Document Reviewed: 03/01/2015 Elsevier Interactive Patient Education  20Henry Schein

## 2017-03-24 ENCOUNTER — Other Ambulatory Visit: Payer: Self-pay

## 2017-03-25 ENCOUNTER — Other Ambulatory Visit: Payer: Self-pay | Admitting: Family Medicine

## 2017-03-25 ENCOUNTER — Other Ambulatory Visit: Payer: Self-pay

## 2017-03-25 DIAGNOSIS — E119 Type 2 diabetes mellitus without complications: Secondary | ICD-10-CM

## 2017-03-25 MED ORDER — GLUCOSE BLOOD VI STRP: ORAL_STRIP | 12 refills | Status: DC

## 2017-03-29 ENCOUNTER — Ambulatory Visit: Payer: Self-pay | Attending: Family Medicine

## 2017-03-29 DIAGNOSIS — E119 Type 2 diabetes mellitus without complications: Secondary | ICD-10-CM | POA: Insufficient documentation

## 2017-03-29 NOTE — Progress Notes (Signed)
Patient here for lab visit only 

## 2017-03-30 LAB — CMP14+EGFR
A/G RATIO: 1.3 (ref 1.2–2.2)
ALT: 17 IU/L (ref 0–32)
AST: 23 IU/L (ref 0–40)
Albumin: 4.5 g/dL (ref 3.5–5.5)
Alkaline Phosphatase: 135 IU/L — ABNORMAL HIGH (ref 39–117)
BILIRUBIN TOTAL: 0.3 mg/dL (ref 0.0–1.2)
BUN/Creatinine Ratio: 29 — ABNORMAL HIGH (ref 9–23)
BUN: 25 mg/dL — ABNORMAL HIGH (ref 6–24)
CHLORIDE: 105 mmol/L (ref 96–106)
CO2: 20 mmol/L (ref 20–29)
Calcium: 10.2 mg/dL (ref 8.7–10.2)
Creatinine, Ser: 0.85 mg/dL (ref 0.57–1.00)
GFR calc Af Amer: 92 mL/min/{1.73_m2} (ref >59)
GFR, EST NON AFRICAN AMERICAN: 80 mL/min/{1.73_m2} (ref >59)
GLOBULIN, TOTAL: 3.4 g/dL (ref 1.5–4.5)
Glucose: 124 mg/dL — ABNORMAL HIGH (ref 65–99)
POTASSIUM: 4.9 mmol/L (ref 3.5–5.2)
SODIUM: 142 mmol/L (ref 134–144)
Total Protein: 7.9 g/dL (ref 6.0–8.5)

## 2017-03-30 LAB — LIPID PANEL
CHOL/HDL RATIO: 2.3 ratio (ref 0.0–4.4)
CHOLESTEROL TOTAL: 93 mg/dL — AB (ref 100–199)
HDL: 40 mg/dL (ref >39)
LDL CALC: 37 mg/dL (ref 0–99)
TRIGLYCERIDES: 82 mg/dL (ref 0–149)
VLDL Cholesterol Cal: 16 mg/dL (ref 5–40)

## 2017-03-30 LAB — MICROALBUMIN / CREATININE URINE RATIO
CREATININE, UR: 42.7 mg/dL
Microalb/Creat Ratio: 10.1 mg/g creat (ref 0.0–30.0)
Microalbumin, Urine: 4.3 ug/mL

## 2017-03-30 LAB — HEMOGLOBIN A1C
ESTIMATED AVERAGE GLUCOSE: 157 mg/dL
Hgb A1c MFr Bld: 7.1 % — ABNORMAL HIGH (ref 4.8–5.6)

## 2017-04-05 ENCOUNTER — Ambulatory Visit: Payer: Self-pay

## 2017-04-12 ENCOUNTER — Telehealth: Payer: Self-pay

## 2017-04-12 NOTE — Telephone Encounter (Signed)
Patient was called and informed of lab results via interpretor.([id number]) 

## 2017-04-13 ENCOUNTER — Ambulatory Visit: Payer: Self-pay | Admitting: Family Medicine

## 2017-05-21 ENCOUNTER — Ambulatory Visit: Payer: Self-pay

## 2017-05-25 ENCOUNTER — Other Ambulatory Visit: Payer: Self-pay | Admitting: Family Medicine

## 2017-05-25 DIAGNOSIS — M545 Low back pain, unspecified: Secondary | ICD-10-CM

## 2017-06-22 ENCOUNTER — Ambulatory Visit: Payer: Self-pay | Attending: Family Medicine | Admitting: Family Medicine

## 2017-06-22 ENCOUNTER — Encounter: Payer: Self-pay | Admitting: Family Medicine

## 2017-06-22 VITALS — BP 149/69 | HR 57 | Temp 97.9°F | Ht 62.0 in | Wt 157.6 lb

## 2017-06-22 DIAGNOSIS — I10 Essential (primary) hypertension: Secondary | ICD-10-CM

## 2017-06-22 DIAGNOSIS — Z79899 Other long term (current) drug therapy: Secondary | ICD-10-CM | POA: Insufficient documentation

## 2017-06-22 DIAGNOSIS — E119 Type 2 diabetes mellitus without complications: Secondary | ICD-10-CM

## 2017-06-22 DIAGNOSIS — G5603 Carpal tunnel syndrome, bilateral upper limbs: Secondary | ICD-10-CM | POA: Insufficient documentation

## 2017-06-22 DIAGNOSIS — Z7984 Long term (current) use of oral hypoglycemic drugs: Secondary | ICD-10-CM | POA: Insufficient documentation

## 2017-06-22 DIAGNOSIS — E11649 Type 2 diabetes mellitus with hypoglycemia without coma: Secondary | ICD-10-CM | POA: Insufficient documentation

## 2017-06-22 LAB — GLUCOSE, POCT (MANUAL RESULT ENTRY): POC Glucose: 87 mg/dl (ref 70–99)

## 2017-06-22 LAB — POCT GLYCOSYLATED HEMOGLOBIN (HGB A1C): HEMOGLOBIN A1C: 6.3

## 2017-06-22 MED ORDER — ATORVASTATIN CALCIUM 40 MG PO TABS: 40.0000 mg | ORAL_TABLET | Freq: Every day | ORAL | 6 refills | Status: DC

## 2017-06-22 MED ORDER — METFORMIN HCL 1000 MG PO TABS: 1000.0000 mg | ORAL_TABLET | Freq: Two times a day (BID) | ORAL | 6 refills | Status: DC

## 2017-06-22 MED ORDER — GLIPIZIDE 5 MG PO TABS: 2.5000 mg | ORAL_TABLET | Freq: Two times a day (BID) | ORAL | 6 refills | Status: DC

## 2017-06-22 MED ORDER — LISINOPRIL 2.5 MG PO TABS: 2.5000 mg | ORAL_TABLET | Freq: Every day | ORAL | 6 refills | Status: DC

## 2017-06-22 MED ORDER — CANAGLIFLOZIN 100 MG PO TABS: 100.0000 mg | ORAL_TABLET | Freq: Every day | ORAL | 6 refills | Status: DC

## 2017-06-22 NOTE — Progress Notes (Signed)
Subjective:  Patient ID: Margaret Pace, female    DOB: 1965-03-17  Age: 52 y.o. MRN: [id number]  CC: Diabetes   HPI Margaret Pace is a 52 year old female with a history of type 2 diabetes mellitus (A1c 6.3) who presents today for follow-upJasmynn Pace A1c is 6.3 and has improved from 7.1 previously and she endorses periods of hypoglycemia with sugars dropping to about 58 with associated tremors and sweats which improved on taking foods high in sugars.  She denies numbness in extremities or visual concerns. She has been compliant with a diabetic diet. Her blood pressure is elevated and she does not have a previous history of hypertension. She has no additional concerns today. Past Medical History:  Diagnosis Date  . Anemia    Pt reported blood transfusion after left leg fracture.  . Carpal tunnel syndrome, bilateral   . Diabetes mellitus without complication [facility])     Past Surgical History:  Procedure Laterality Date  . APPLICATION OF WOUND VAC Left 05/14/2014   Procedure: APPLICATION OF WOUND VAC;  Surgeon: Myrene Galas, MD;  Location: [facility] OR;  Service: Orthopedics;  Laterality: Left;  . FASCIOTOMY Left 05/14/2014   Procedure: FOUR COMPARTMENT FASCIOTOMY;  Surgeon: Myrene Galas, MD;  Location: [facility] OR;  Service: Orthopedics;  Laterality: Left;  . FRACTURE SURGERY    . HARDWARE REMOVAL Left 10/19/2014   Procedure: HARDWARE REMOVAL LEFT TIBIA AND ANKLE;  Surgeon: Myrene Galas, MD;  Location: [facility] OR;  Service: Orthopedics;  Laterality: Left;  . SECONDARY CLOSURE OF WOUND Left 05/17/2014   Procedure: WOUND CLOSURE LEFT LEG ;  Surgeon: Myrene Galas, MD;  Location: [facility] OR;  Service: Orthopedics;  Laterality: Left;  . TIBIA IM NAIL INSERTION Left 05/14/2014   Procedure: INTRAMEDULLARY (IM) NAIL TIBIAL;  Surgeon: Myrene Galas, MD;  Location: [facility] OR;  Service: Orthopedics;  Laterality: Left;    No Known Allergies   Outpatient Medications Prior to Visit  Medication Sig Dispense Refill  . Blood  Glucose Monitoring Suppl (TRUE METRIX METER) DEVI 1 each by Does not apply route 3 (three) times daily before meals. 1 Device 0  . glucose blood (TRUE METRIX BLOOD GLUCOSE TEST) test strip Use three times daily 100 each 12  . naproxen (NAPROSYN) 500 MG tablet TAKE 1 TABLET BY MOUTH 2 TIMES DAILY WITH A MEAL. 60 tablet 2  . TRUEPLUS LANCETS 28G MISC 1 each by Does not apply route 3 (three) times daily before meals. 100 each 12  . atorvastatin (LIPITOR) 40 MG tablet Take 1 tablet (40 mg total) by mouth daily. 30 tablet 6  . canagliflozin (INVOKANA) 100 MG TABS tablet Take 1 tablet (100 mg total) by mouth daily before breakfast. 30 tablet 3  . glipiZIDE (GLUCOTROL) 5 MG tablet Take 1 tablet (5 mg total) by mouth 2 (two) times daily before a meal. 60 tablet 3  . metFORMIN (GLUCOPHAGE) 1000 MG tablet Take 1 tablet (1,000 mg total) by mouth 2 (two) times daily with a meal. 60 tablet 6  . polyethylene glycol-electrolytes (TRILYTE) 420 g solution Take 4,000 mLs by mouth as directed. (Patient not taking: Reported on 01/28/2017) 4000 mL 0   No facility-administered medications prior to visit.     ROS Review of Systems  Constitutional: Negative for activity change, appetite change and fatigue.  HENT: Negative for congestion, sinus pressure and sore throat.   Eyes: Negative for visual disturbance.  Respiratory: Negative for cough, chest tightness, shortness of breath and wheezing.   Cardiovascular: Negative for chest  pain and palpitations.  Gastrointestinal: Negative for abdominal distention, abdominal pain and constipation.  Endocrine: Negative for polydipsia.  Genitourinary: Negative for dysuria and frequency.  Musculoskeletal: Negative for arthralgias and back pain.  Skin: Negative for rash.  Neurological: Negative for tremors, light-headedness and numbness.  Hematological: Does not bruise/bleed easily.  Psychiatric/Behavioral: Negative for agitation and behavioral problems.    Objective:  BP  (!) 149/69   Pulse (!) 57   Temp 97.9 F (36.6 C)   Ht  (1.575 m)   Wt 157 lb 9.6 oz (71.5 kg)   LMP 11/12/2013   SpO2 100%   BMI 28.83 kg/m   BP/Weight 06/22/2017 03/23/2017 01/28/2017  Systolic BP 149 132 108  Diastolic BP 69 75 60  Wt. (Lbs) 157.6 157.4 156.4  BMI 28.83 28.79 28.61      Physical Exam  Constitutional: She is oriented to person, place, and time. She appears well-developed and well-nourished.  Cardiovascular: Normal rate, normal heart sounds and intact distal pulses.  No murmur heard. Pulmonary/Chest: Effort normal and breath sounds normal. She has no wheezes. She has no rales. She exhibits no tenderness.  Abdominal: Soft. Bowel sounds are normal. She exhibits no distension and no mass. There is no tenderness.  Musculoskeletal: Normal range of motion.  Neurological: She is alert and oriented to person, place, and time.  Skin: Skin is warm and dry.  Psychiatric: She has a normal mood and affect.     CMP Latest Ref Rng & Units 03/29/2017 07/29/2016 04/14/2016  Glucose 65 - 99 mg/dL 960(A) 81 540(J)  BUN 6 - 24 mg/dL 81(X) 12 17  Creatinine 0.57 - 1.00 mg/dL 9.14 7.82 9.56  Sodium 134 - 144 mmol/L 142 137 139  Potassium 3.5 - 5.2 mmol/L 4.9 4.1 4.6  Chloride 96 - 106 mmol/L 105 105 104  CO2 20 - 29 mmol/L Calcium 8.7 - 10.2 mg/dL 21.3 9.2 9.7  Total Protein 6.0 - 8.5 g/dL 7.9 7.2 7.6  Total Bilirubin 0.0 - 1.2 mg/dL 0.3 0.6 0.5  Alkaline Phos 39 - 117 IU/L 135(H) 123 120  AST 0 - 40 IU/L ALT 0 - 32 IU/L Lipid Panel     Component Value Date/Time   CHOL 93 (L) 03/29/2017 0834   TRIG 82 03/29/2017 0834   HDL 40 03/29/2017 0834   CHOLHDL 2.3 03/29/2017 0834   CHOLHDL 3.3 04/14/2016 0928   VLDL 45 (H) 09/25/2015 1144   LDLCALC 37 03/29/2017 0834    Lab Results  Component Value Date   HGBA1C 6.3 06/22/2017    Assessment & Plan:   1. Type 2 diabetes mellitus without complication, without long-term current use of  insulin (HCC) Controlled with A1c of 6.3 which has improved from 7.1 previously Due to hypoglycemic episodes I am reducing the dose of glipizide Continue with diabetic diet - POCT glucose (manual entry) - POCT glycosylated hemoglobin (Hb A1C) - glipiZIDE (GLUCOTROL) 5 MG tablet; Take 0.5 tablets (2.5 mg total) by mouth 2 (two) times daily before a meal.  Dispense: 30 tablet; Refill: 6 - canagliflozin (INVOKANA) 100 MG TABS tablet; Take 1 tablet (100 mg total) by mouth daily before breakfast.  Dispense: 30 tablet; Refill: 6 - atorvastatin (LIPITOR) 40 MG tablet; Take 1 tablet (40 mg total) by mouth daily.  Dispense: 30 tablet; Refill: 6 - metFORMIN (GLUCOPHAGE) 1000 MG tablet; Take 1 tablet (1,000 mg total) by mouth 2 (two) times daily  with a meal.  Dispense: 60 tablet; Refill: 6  2. Essential hypertension Commenced on low-dose lisinopril which will help with renal protection   Meds ordered this encounter  Medications  . lisinopril (PRINIVIL,ZESTRIL) 2.5 MG tablet    Sig: Take 1 tablet (2.5 mg total) by mouth daily.    Dispense:  30 tablet    Refill:  6  . glipiZIDE (GLUCOTROL) 5 MG tablet    Sig: Take 0.5 tablets (2.5 mg total) by mouth 2 (two) times daily before a meal.    Dispense:  30 tablet    Refill:  6    Discontinue previous dose  . canagliflozin (INVOKANA) 100 MG TABS tablet    Sig: Take 1 tablet (100 mg total) by mouth daily before breakfast.    Dispense:  30 tablet    Refill:  6  . atorvastatin (LIPITOR) 40 MG tablet    Sig: Take 1 tablet (40 mg total) by mouth daily.    Dispense:  30 tablet    Refill:  6  . metFORMIN (GLUCOPHAGE) 1000 MG tablet    Sig: Take 1 tablet (1,000 mg total) by mouth 2 (two) times daily with a meal.    Dispense:  60 tablet    Refill:  6    Follow-up: Return in about 3 months (around 09/22/2017) for follow up of chronic medical conditions.   Hoy Register MD

## 2017-06-22 NOTE — Patient Instructions (Signed)

## 2017-07-31 ENCOUNTER — Emergency Department (HOSPITAL_COMMUNITY)
Admission: EM | Admit: 2017-07-31 | Discharge: 2017-07-31 | Disposition: A | Discharge status: Home / Self Care | Payer: Self-pay | Attending: Emergency Medicine | Admitting: Emergency Medicine

## 2017-07-31 ENCOUNTER — Other Ambulatory Visit: Payer: Self-pay

## 2017-07-31 ENCOUNTER — Encounter (HOSPITAL_COMMUNITY): Payer: Self-pay

## 2017-07-31 DIAGNOSIS — I10 Essential (primary) hypertension: Secondary | ICD-10-CM | POA: Insufficient documentation

## 2017-07-31 DIAGNOSIS — Z79899 Other long term (current) drug therapy: Secondary | ICD-10-CM | POA: Insufficient documentation

## 2017-07-31 DIAGNOSIS — B029 Zoster without complications: Secondary | ICD-10-CM | POA: Insufficient documentation

## 2017-07-31 DIAGNOSIS — E119 Type 2 diabetes mellitus without complications: Secondary | ICD-10-CM | POA: Insufficient documentation

## 2017-07-31 MED ORDER — ACYCLOVIR 400 MG PO TABS: 400.0000 mg | ORAL_TABLET | Freq: Four times a day (QID) | ORAL | 0 refills | Status: DC

## 2017-07-31 MED ORDER — HYDROCODONE-ACETAMINOPHEN 5-325 MG PO TABS: 1.0000 | ORAL_TABLET | Freq: Four times a day (QID) | ORAL | 0 refills | Status: DC | PRN

## 2017-07-31 NOTE — ED Triage Notes (Signed)
Pt presents for evaluation of back pain and rash. Pt has shingles like rash to L flank and rib cage x 1 week.
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
## 2017-07-31 NOTE — Discharge Instructions (Addendum)
Please take Ibuprofen (Advil, motrin) and Tylenol (acetaminophen) to relieve your pain.  You may take up to 600 MG (3 pills) of normal strength ibuprofen every 8 hours as needed.  In between doses of ibuprofen you make take tylenol, up to 1,000 mg (two extra strength pills).  Do not take more than 3,000 mg tylenol in a 24 hour period.  Please check all medication labels as many medications such as pain and cold medications may contain tylenol.  Do not drink alcohol while taking these medications.  Do not take other NSAID'S while taking ibuprofen (such as aleve or naproxen).  Please take ibuprofen with food to decrease stomach upset.  If you develop rashes outside of the general area where your current rashes or it crosses the middle of your body or you do not feel well or have other concerns please seek additional medical care and evaluation.  You are being prescribed a medication which may make you sleepy. For 24 hours after one dose please do not drive, operate heavy machinery, care for a small child with out another adult present, or perform any activities that may cause harm to you or someone else if you were to fall asleep or be impaired.

## 2017-09-21 ENCOUNTER — Ambulatory Visit: Payer: Self-pay | Attending: Family Medicine | Admitting: Family Medicine

## 2017-09-21 ENCOUNTER — Encounter: Payer: Self-pay | Admitting: Family Medicine

## 2017-09-21 VITALS — BP 114/71 | HR 65 | Temp 97.4°F | Ht 62.0 in | Wt 147.4 lb

## 2017-09-21 DIAGNOSIS — E119 Type 2 diabetes mellitus without complications: Secondary | ICD-10-CM | POA: Insufficient documentation

## 2017-09-21 DIAGNOSIS — Z9889 Other specified postprocedural states: Secondary | ICD-10-CM | POA: Insufficient documentation

## 2017-09-21 DIAGNOSIS — Z7984 Long term (current) use of oral hypoglycemic drugs: Secondary | ICD-10-CM | POA: Insufficient documentation

## 2017-09-21 DIAGNOSIS — Z79899 Other long term (current) drug therapy: Secondary | ICD-10-CM | POA: Insufficient documentation

## 2017-09-21 DIAGNOSIS — G5603 Carpal tunnel syndrome, bilateral upper limbs: Secondary | ICD-10-CM | POA: Insufficient documentation

## 2017-09-21 DIAGNOSIS — E0800 Diabetes mellitus due to underlying condition with hyperosmolarity without nonketotic hyperglycemic-hyperosmolar coma (NKHHC): Secondary | ICD-10-CM

## 2017-09-21 LAB — POCT GLYCOSYLATED HEMOGLOBIN (HGB A1C): HBA1C, POC (CONTROLLED DIABETIC RANGE): 7.6 % — AB (ref 0.0–7.0)

## 2017-09-21 LAB — GLUCOSE, POCT (MANUAL RESULT ENTRY): POC Glucose: 51 mg/dl — AB (ref 70–99)

## 2017-09-21 MED ORDER — CANAGLIFLOZIN 100 MG PO TABS: 100.0000 mg | ORAL_TABLET | Freq: Every day | ORAL | 6 refills | Status: DC

## 2017-09-21 MED ORDER — METFORMIN HCL 1000 MG PO TABS: 1000.0000 mg | ORAL_TABLET | Freq: Two times a day (BID) | ORAL | 6 refills | Status: DC

## 2017-09-21 MED ORDER — ATORVASTATIN CALCIUM 40 MG PO TABS: 40.0000 mg | ORAL_TABLET | Freq: Every day | ORAL | 6 refills | Status: DC

## 2017-09-21 MED ORDER — GLIPIZIDE 5 MG PO TABS: 2.5000 mg | ORAL_TABLET | Freq: Two times a day (BID) | ORAL | 6 refills | Status: DC

## 2017-09-21 NOTE — Progress Notes (Signed)
Subjective:  Patient ID: Margaret Pace, female    DOB: 11/22/1965  Age: 52 y.o. MRN: [id number]  CC: Diabetes   HPI Margaret Pace is a 52 year old female with a history of type 2 diabetes mellitus (A1c 7.6) who presents today for follow-up visit.  Her A1c is 7.6 which has trended up froStanford Breedm 6.3 previously.  Glipizide had been decreased from 5 mg to 2.5 mg at her last office visit due to complaints of hypoglycemia which she states has resolved ever since the dose decreased.  Denies visual concerns, numbness in extremities and she  is adherent to a diabetic diet and exercise  Past Medical History:  Diagnosis Date  . Anemia    Pt reported blood transfusion after left leg fracture.  . Carpal tunnel syndrome, bilateral   . Diabetes mellitus without complication [facility](HCC)     Past Surgical History:  Procedure Laterality Date  . APPLICATION OF WOUND VAC Left 05/14/2014   Procedure: APPLICATION OF WOUND VAC;  Surgeon: Myrene GalasMichael Handy, MD;  [facility];  Service: Orthopedics;  Laterality: Left;  . FASCIOTOMY Left 05/14/2014   Procedure: FOUR COMPARTMENT FASCIOTOMY;  Surgeon: Myrene GalasMichael Handy, MD;  [facility];  Service: Orthopedics;  Laterality: Left;  . FRACTURE SURGERY    . HARDWARE REMOVAL Left 10/19/2014   Procedure: HARDWARE REMOVAL LEFT TIBIA AND ANKLE;  Surgeon: Myrene GalasMichael Handy, MD;  [facility];  Service: Orthopedics;  Laterality: Left;  . SECONDARY CLOSURE OF WOUND Left 05/17/2014   Procedure: WOUND CLOSURE LEFT LEG ;  Surgeon: Myrene GalasMichael Handy, MD;  [facility];  Service: Orthopedics;  Laterality: Left;  . TIBIA IM NAIL INSERTION Left 05/14/2014   Procedure: INTRAMEDULLARY (IM) NAIL TIBIAL;  Surgeon: Myrene GalasMichael Handy, MD;  [facility];  Service: Orthopedics;  Laterality: Left;    No Known Allergies   Outpatient Medications Prior to Visit  Medication Sig Dispense Refill  . Blood Glucose Monitoring Suppl (TRUE METRIX METER) DEVI 1 each by Does not apply route 3 (three) times daily before  meals. 1 Device 0  . glucose blood (TRUE METRIX BLOOD GLUCOSE TEST) test strip Use three times daily 100 each 12  . naproxen (NAPROSYN) 500 MG tablet TAKE 1 TABLET BY MOUTH 2 TIMES DAILY WITH A MEAL. 60 tablet 2  . TRUEPLUS LANCETS 28G MISC 1 each by Does not apply route 3 (three) times daily before meals. 100 each 12  . atorvastatin (LIPITOR) 40 MG tablet Take 1 tablet (40 mg total) by mouth daily. 30 tablet 6  . canagliflozin (INVOKANA) 100 MG TABS tablet Take 1 tablet (100 mg total) by mouth daily before breakfast. 30 tablet 6  . glipiZIDE (GLUCOTROL) 5 MG tablet Take 0.5 tablets (2.5 mg total) by mouth 2 (two) times daily before a meal. 30 tablet 6  . metFORMIN (GLUCOPHAGE) 1000 MG tablet Take 1 tablet (1,000 mg total) by mouth 2 (two) times daily with a meal. 60 tablet 6  . acyclovir (ZOVIRAX) 400 MG tablet Take 1 tablet (400 mg total) by mouth 4 (four) times daily. (Patient not taking: Reported on 09/21/2017) 50 tablet 0  . HYDROcodone-acetaminophen (NORCO/VICODIN) 5-325 MG tablet Take 1 tablet by mouth every 6 (six) hours as needed for severe pain. (Patient not taking: Reported on 09/21/2017) 6 tablet 0  . lisinopril (PRINIVIL,ZESTRIL) 2.5 MG tablet Take 1 tablet (2.5 mg total) by mouth daily. (Patient not taking: Reported on 09/21/2017) 30 tablet 6  . polyethylene glycol-electrolytes (TRILYTE) 420 g solution Take 4,000 mLs by mouth  as directed. (Patient not taking: Reported on 01/28/2017) 4000 mL 0   No facility-administered medications prior to visit.     ROS Review of Systems  Constitutional: Negative for activity change, appetite change and fatigue.  HENT: Negative for congestion, sinus pressure and sore throat.   Eyes: Negative for visual disturbance.  Respiratory: Negative for cough, chest tightness, shortness of breath and wheezing.   Cardiovascular: Negative for chest pain and palpitations.  Gastrointestinal: Negative for abdominal distention, abdominal pain and constipation.    Endocrine: Negative for polydipsia.  Genitourinary: Negative for dysuria and frequency.  Musculoskeletal: Negative for arthralgias and back pain.  Skin: Negative for rash.  Neurological: Negative for tremors, light-headedness and numbness.  Hematological: Does not bruise/bleed easily.  Psychiatric/Behavioral: Negative for agitation and behavioral problems.    Objective:  BP 114/71   Pulse 65   Temp (!) 97.4 F (36.3 C) (Oral)   Ht 5\' 2"  (1.575 m)   Wt 147 lb 6.4 oz (66.9 kg)   LMP 11/12/2013   SpO2 100%   BMI 26.96 kg/m   BP/Weight 09/21/2017 07/31/2017 06/22/2017  Systolic BP 114 113 149  Diastolic BP 71 70 69  Wt. (Lbs) 147.4 - 157.6  BMI 26.96 - 28.83      Physical Exam  Constitutional: She is oriented to person, place, and time. She appears well-developed and well-nourished.  Cardiovascular: Normal rate, normal heart sounds and intact distal pulses.  No murmur heard. Pulmonary/Chest: Effort normal and breath sounds normal. She has no wheezes. She has no rales. She exhibits no tenderness.  Abdominal: Soft. Bowel sounds are normal. She exhibits no distension and no mass. There is no tenderness.  Musculoskeletal: Normal range of motion.  Neurological: She is alert and oriented to person, place, and time.  Skin: Skin is warm and dry.  Psychiatric: She has a normal mood and affect.     CMP Latest Ref Rng & Units 03/29/2017 07/29/2016 04/14/2016  Glucose 65 - 99 mg/dL 409(W) 81 119(J)  BUN 6 - 24 mg/dL 47(W) 12 17  Creatinine 0.57 - 1.00 mg/dL 2.95 6.21 3.08  Sodium 134 - 144 mmol/L 142 137 139  Potassium 3.5 - 5.2 mmol/L 4.9 4.1 4.6  Chloride 96 - 106 mmol/L 105 105 104  CO2 20 - 29 mmol/L 20 23 24   Calcium 8.7 - 10.2 mg/dL 65.7 9.2 9.7  Total Protein 6.0 - 8.5 g/dL 7.9 7.2 7.6  Total Bilirubin 0.0 - 1.2 mg/dL 0.3 0.6 0.5  Alkaline Phos 39 - 117 IU/L 135(H) 123 120  AST 0 - 40 IU/L 23 30 21   ALT 0 - 32 IU/L 17 20 22     Lipid Panel     Component Value Date/Time    CHOL 93 (L) 03/29/2017 0834   TRIG 82 03/29/2017 0834   HDL 40 03/29/2017 0834   CHOLHDL 2.3 03/29/2017 0834   CHOLHDL 3.3 04/14/2016 0928   VLDL 45 (H) 09/25/2015 1144   LDLCALC 37 03/29/2017 0834    Lab Results  Component Value Date   HGBA1C 7.6 (A) 09/21/2017    Assessment & Plan:   1. Type 2 diabetes mellitus without complication, without long-term current use of insulin (HCC) Controlled with A1c of 7.6 which has trended up from 6.3 previously No regimen change today but encouraged to adhere to a diabetic diet Upward trend could be attributed possibly to reduction in dose of glipizide at her last visit due to complaints of hypoglycemia Counseled on Diabetic diet, my plate method, 846  minutes of moderate intensity exercise/week Keep blood sugar logs with fasting goals of 80-120 mg/dl, random of less than [id number] and in the event of sugars less than 60 mg/dl or greater than 096400 mg/dl please notify the clinic ASAP. It is recommended that you undergo annual eye exams and annual foot exams. Pneumonia vaccine is recommended. - POCT glucose (manual entry) - POCT glycosylated hemoglobin (Hb A1C) - metFORMIN (GLUCOPHAGE) 1000 MG tablet; Take 1 tablet (1,000 mg total) by mouth 2 (two) times daily with a meal.  Dispense: 60 tablet; Refill: 6 - glipiZIDE (GLUCOTROL) 5 MG tablet; Take 0.5 tablets (2.5 mg total) by mouth 2 (two) times daily before a meal.  Dispense: 30 tablet; Refill: 6 - canagliflozin (INVOKANA) 100 MG TABS tablet; Take 1 tablet (100 mg total) by mouth daily before breakfast.  Dispense: 30 tablet; Refill: 6 - atorvastatin (LIPITOR) 40 MG tablet; Take 1 tablet (40 mg total) by mouth daily.  Dispense: 30 tablet; Refill: 6   Meds ordered this encounter  Medications  . metFORMIN (GLUCOPHAGE) 1000 MG tablet    Sig: Take 1 tablet (1,000 mg total) by mouth 2 (two) times daily with a meal.    Dispense:  60 tablet    Refill:  6  . glipiZIDE (GLUCOTROL) 5 MG tablet    Sig: Take  0.5 tablets (2.5 mg total) by mouth 2 (two) times daily before a meal.    Dispense:  30 tablet    Refill:  6  . canagliflozin (INVOKANA) 100 MG TABS tablet    Sig: Take 1 tablet (100 mg total) by mouth daily before breakfast.    Dispense:  30 tablet    Refill:  6  . atorvastatin (LIPITOR) 40 MG tablet    Sig: Take 1 tablet (40 mg total) by mouth daily.    Dispense:  30 tablet    Refill:  6    Follow-up: Return in about 3 months (around 12/22/2017) for follow up on Diabetes.   Hoy RegisterEnobong Alaila Pillard MD

## 2017-09-30 ENCOUNTER — Ambulatory Visit (HOSPITAL_COMMUNITY): Payer: Self-pay

## 2017-09-30 ENCOUNTER — Encounter (HOSPITAL_COMMUNITY): Payer: Self-pay

## 2017-10-12 ENCOUNTER — Other Ambulatory Visit: Payer: Self-pay | Admitting: Family Medicine

## 2017-10-12 DIAGNOSIS — M545 Low back pain, unspecified: Secondary | ICD-10-CM

## 2017-12-20 ENCOUNTER — Encounter (HOSPITAL_COMMUNITY): Payer: Self-pay | Admitting: *Deleted

## 2017-12-20 ENCOUNTER — Emergency Department (HOSPITAL_COMMUNITY)
Admission: EM | Admit: 2017-12-20 | Discharge: 2017-12-20 | Disposition: A | Discharge status: Home / Self Care | Payer: Self-pay | Attending: Emergency Medicine | Admitting: Emergency Medicine

## 2017-12-20 ENCOUNTER — Emergency Department (HOSPITAL_BASED_OUTPATIENT_CLINIC_OR_DEPARTMENT_OTHER): Payer: Self-pay

## 2017-12-20 ENCOUNTER — Emergency Department (HOSPITAL_COMMUNITY): Payer: Self-pay

## 2017-12-20 DIAGNOSIS — L03116 Cellulitis of left lower limb: Secondary | ICD-10-CM

## 2017-12-20 DIAGNOSIS — R112 Nausea with vomiting, unspecified: Secondary | ICD-10-CM

## 2017-12-20 DIAGNOSIS — I10 Essential (primary) hypertension: Secondary | ICD-10-CM | POA: Insufficient documentation

## 2017-12-20 DIAGNOSIS — E119 Type 2 diabetes mellitus without complications: Secondary | ICD-10-CM | POA: Insufficient documentation

## 2017-12-20 DIAGNOSIS — Z7984 Long term (current) use of oral hypoglycemic drugs: Secondary | ICD-10-CM | POA: Insufficient documentation

## 2017-12-20 DIAGNOSIS — Z79899 Other long term (current) drug therapy: Secondary | ICD-10-CM | POA: Insufficient documentation

## 2017-12-20 DIAGNOSIS — N3000 Acute cystitis without hematuria: Secondary | ICD-10-CM

## 2017-12-20 DIAGNOSIS — R609 Edema, unspecified: Secondary | ICD-10-CM

## 2017-12-20 LAB — COMPREHENSIVE METABOLIC PANEL
ALBUMIN: 3.6 g/dL (ref 3.5–5.0)
ALT: 13 U/L (ref 0–44)
AST: 22 U/L (ref 15–41)
Alkaline Phosphatase: 108 U/L (ref 38–126)
Anion gap: 10 (ref 5–15)
BUN: 24 mg/dL — AB (ref 6–20)
CHLORIDE: 103 mmol/L (ref 98–111)
CO2: 21 mmol/L — AB (ref 22–32)
Calcium: 9.6 mg/dL (ref 8.9–10.3)
Creatinine, Ser: 0.96 mg/dL (ref 0.44–1.00)
GFR calc Af Amer: >60 mL/min (ref >60)
GFR calc non Af Amer: >60 mL/min (ref >60)
GLUCOSE: 124 mg/dL — AB (ref 70–99)
POTASSIUM: 3.9 mmol/L (ref 3.5–5.1)
SODIUM: 134 mmol/L — AB (ref 135–145)
Total Bilirubin: 0.8 mg/dL (ref 0.3–1.2)
Total Protein: 7.8 g/dL (ref 6.5–8.1)

## 2017-12-20 LAB — URINALYSIS, ROUTINE W REFLEX MICROSCOPIC
BILIRUBIN URINE: NEGATIVE
KETONES UR: NEGATIVE mg/dL
Nitrite: POSITIVE — AB
PH: 5 (ref 5.0–8.0)
PROTEIN: NEGATIVE mg/dL
Specific Gravity, Urine: 1.03 (ref 1.005–1.030)

## 2017-12-20 LAB — CBC WITH DIFFERENTIAL/PLATELET
ABS IMMATURE GRANULOCYTES: 0.02 10*3/uL (ref 0.00–0.07)
Basophils Absolute: 0 10*3/uL (ref 0.0–0.1)
Basophils Relative: 0 %
Eosinophils Absolute: 0 10*3/uL (ref 0.0–0.5)
Eosinophils Relative: 0 %
HCT: 40.3 % (ref 36.0–46.0)
HEMOGLOBIN: 12.6 g/dL (ref 12.0–15.0)
IMMATURE GRANULOCYTES: 0 %
LYMPHS PCT: 6 %
Lymphs Abs: 0.6 10*3/uL — ABNORMAL LOW (ref 0.7–4.0)
MCH: 28.8 pg (ref 26.0–34.0)
MCHC: 31.3 g/dL (ref 30.0–36.0)
MCV: 92 fL (ref 80.0–100.0)
MONO ABS: 0.6 10*3/uL (ref 0.1–1.0)
Monocytes Relative: 6 %
NEUTROS ABS: 8.3 10*3/uL — AB (ref 1.7–7.7)
NEUTROS PCT: 88 %
PLATELETS: 212 10*3/uL (ref 150–400)
RBC: 4.38 MIL/uL (ref 3.87–5.11)
RDW: 14.2 % (ref 11.5–15.5)
WBC: 9.5 10*3/uL (ref 4.0–10.5)
nRBC: 0 % (ref 0.0–0.2)

## 2017-12-20 LAB — I-STAT BETA HCG BLOOD, ED (MC, WL, AP ONLY): I-stat hCG, quantitative: 6.1 m[IU]/mL — ABNORMAL HIGH (ref <5)

## 2017-12-20 LAB — I-STAT CG4 LACTIC ACID, ED
LACTIC ACID, VENOUS: 1.25 mmol/L (ref 0.5–1.9)
Lactic Acid, Venous: 2.44 mmol/L (ref 0.5–1.9)

## 2017-12-20 LAB — LIPASE, BLOOD: Lipase: 30 U/L (ref 11–51)

## 2017-12-20 LAB — CBG MONITORING, ED: GLUCOSE-CAPILLARY: 101 mg/dL — AB (ref 70–99)

## 2017-12-20 MED ORDER — ACETAMINOPHEN 325 MG PO TABS
650.0000 mg | ORAL_TABLET | Freq: Once | ORAL | Status: AC
  Administered 2017-12-20: 650 mg via ORAL
  Filled 2017-12-20: qty 2

## 2017-12-20 MED ORDER — SULFAMETHOXAZOLE-TRIMETHOPRIM 800-160 MG PO TABS: 1.0000 | ORAL_TABLET | Freq: Two times a day (BID) | ORAL | 0 refills | Status: AC

## 2017-12-20 MED ORDER — METRONIDAZOLE IN NACL 5-0.79 MG/ML-% IV SOLN
500.0000 mg | Freq: Once | INTRAVENOUS | Status: AC
  Administered 2017-12-20: 500 mg via INTRAVENOUS
  Filled 2017-12-20: qty 100

## 2017-12-20 MED ORDER — SODIUM CHLORIDE 0.9 % IV SOLN
2.0000 g | Freq: Once | INTRAVENOUS | Status: AC
  Administered 2017-12-20: 2 g via INTRAVENOUS
  Filled 2017-12-20: qty 20

## 2017-12-20 MED ORDER — SODIUM CHLORIDE 0.9 % IV BOLUS
1000.0000 mL | Freq: Once | INTRAVENOUS | Status: AC
  Administered 2017-12-20: 1000 mL via INTRAVENOUS

## 2017-12-20 MED ORDER — IOHEXOL 300 MG/ML  SOLN
100.0000 mL | Freq: Once | INTRAMUSCULAR | Status: AC | PRN
  Administered 2017-12-20: 100 mL via INTRAVENOUS

## 2017-12-20 MED ORDER — ONDANSETRON HCL 4 MG/2ML IJ SOLN
4.0000 mg | Freq: Once | INTRAMUSCULAR | Status: AC
  Administered 2017-12-20: 4 mg via INTRAVENOUS
  Filled 2017-12-20: qty 2

## 2017-12-20 NOTE — ED Notes (Signed)
Pt ambulated in room with steady gait, c.o some leg pain. Pt given sprite.

## 2017-12-20 NOTE — ED Notes (Signed)
Patient transported to CT 
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
## 2017-12-20 NOTE — ED Provider Notes (Signed)
Medical screening examination/treatment/procedure(s) were conducted as a shared visit with non-physician practitioner(s) and myself.  I personally evaluated the patient during the encounter.  Clinical Impression:   Final diagnoses:  Acute cystitis without hematuria  Cellulitis of left leg  Nausea and vomiting, intractability of vomiting not specified, unspecified vomiting type   The patient is an 52 year old female, she presents with a complaint of bilateral lower abdominal pain which is been going on for a week with associated vomiting as well as a complaint of some redness to the left inner calf.  On exam the patient does not fact have mild to moderate tenderness in the bilateral lower abdomen without guarding or peritoneal signs.  She has no upper abdominal tenderness and is not tachycardic.  She does not fact have some erythema and warmth to the left inner lower leg below the knee.  She has had a prior surgical procedure on her left leg and has a well-healed scar underlying this redness.  This was from several years ago.  The patient has no leukocytosis however she has a low-grade fever at 100.4, her lactic acid is 2.44, she is a diabetic.  Will check labs, she will give fluids, antibiotics, I suspect she may have diverticulitis or some other intra-abdominal process.  She does not appear tender over her gallbladder, this is not consistent with pancreatitis, this could be a cellulitis of the leg but given her prior surgery and redness on the vascular path of the leg would also need to rule out DVT.   Eber Hong, MD 12/21/17 [phone]

## 2017-12-20 NOTE — ED Notes (Signed)
Lab results reported to Nurse. 

## 2017-12-20 NOTE — ED Triage Notes (Signed)
Pt reports weakness started one week ago and lt lower leg turned red today. Pt also reports Lt foot pain. Pt has listed in medical hx anemia ,pt denies receiving blood transfusions for anemia.

## 2017-12-20 NOTE — Discharge Instructions (Signed)
You have been seen today for nausea/vomiting. Please read and follow all provided instructions.   1. Medications: Bactrim (antibiotic), usual home medications 2. Treatment: rest, drink plenty of fluids, and take medications as prescribed. 3. Follow Up: Please follow up with your primary doctor in 2 days for discussion of your diagnoses and further evaluation after today's visit; if you do not have a primary care doctor use the resource guide provided to find one; Please return to the ER for any new or worsening symptoms.   Take medications as prescribed. Return to the emergency room for worsening condition or new concerning symptoms. Follow up with your regular doctor. If you don't have a regular doctor use one of the numbers below to establish a primary care doctor.  Stay very well hydrated with plenty of water throughout the day. Take antibiotic in full course. Use pyridium as directed to decrease painful urination but know that a common side effect is to turn your urine a bright orange/red color. This is not a harmful side effect. Follow up with primary care physician in 1 week for recheck of ongoing symptoms but return to ER for emergent changing or worsening of symptoms.   Please seek immediate care if you develop the following: There is back pain.  Your symptoms are no better or worse in 3 days. There is severe back pain or lower abdominal pain.  You develop chills.  You have a fever.  There is nausea or vomiting.  There is continued burning or discomfort with urination.     Emergency Department Resource Guide 1) Find a Doctor and Pay Out of Pocket Although you won't have to find out who is covered by your insurance plan, it is a good idea to ask around and get recommendations. You will then need to call the office and see if the doctor you have chosen will accept you as a new patient and what types of options they offer for patients who are self-pay. Some doctors offer discounts or will  set up payment plans for their patients who do not have insurance, but you will need to ask so you aren't surprised when you get to your appointment.  2) Contact Your Local Health Department Not all health departments have doctors that can see patients for sick visits, but many do, so it is worth a call to see if yours does. If you don't know where your local health department is, you can check in your phone book. The CDC also has a tool to help you locate your state's health department, and many state websites also have listings of all of their local health departments.  3) Find a Walk-in Clinic If your illness is not likely to be very severe or complicated, you may want to try a walk in clinic. These are popping up all over the country in pharmacies, drugstores, and shopping centers. They're usually staffed by nurse practitioners or physician assistants that have been trained to treat common illnesses and complaints. They're usually fairly quick and inexpensive. However, if you have serious medical issues or chronic medical problems, these are probably not your best option.  No Primary Care Doctor: Call Health Connect at  [phone] - they can help you locate a primary care doctor that  accepts your insurance, provides certain services, etc. Physician Referral [phone]  Emergency Department Resource Guide 1) Find a Doctor and Pay Out of Pocket Although you won't have to find out who is covered by your insurance plan, it  is a good idea to ask around and get recommendations. You will then need to call the office and see if the doctor you have chosen will accept you as a new patient and what types of options they offer for patients who are self-pay. Some doctors offer discounts or will set up payment plans for their patients who do not have insurance, but you will need to ask so you aren't surprised when you get to your appointment.  2) Contact Your Local Health Department Not all health  departments have doctors that can see patients for sick visits, but many do, so it is worth a call to see if yours does. If you don't know where your local health department is, you can check in your phone book. The CDC also has a tool to help you locate your state's health department, and many state websites also have listings of all of their local health departments.  3) Find a Walk-in Clinic If your illness is not likely to be very severe or complicated, you may want to try a walk in clinic. These are popping up all over the country in pharmacies, drugstores, and shopping centers. They're usually staffed by nurse practitioners or physician assistants that have been trained to treat common illnesses and complaints. They're usually fairly quick and inexpensive. However, if you have serious medical issues or chronic medical problems, these are probably not your best option.  No Primary Care Doctor: Call Health Connect at  [phone] - they can help you locate a primary care doctor that  accepts your insurance, provides certain services, etc. Physician Referral Service- [phone]  Chronic Pain Problems: Organization         Address  Phone   Notes  [facility]  [phone] Patients need to be referred by their primary care doctor.   Medication Assistance: Organization         Address  Phone   Notes  [facility] Medication [facility] [address] [place] [place], [place] [address] [phone] --Must be a resident of [facility] -- Must have NO insurance coverage whatsoever (no Medicaid/ Medicare, etc.) -- The pt. MUST have a primary care doctor that directs their care regularly and follows them in the community   [facility]  [phone]   [employer]  [phone]    Agencies that provide inexpensive medical care: Organization         Address  Phone   Notes  [facility]  [phone]   [facility]    [phone]   [facility] [address] [place], [place] [address] [phone]   [facility] [address], [place] [phone]   [facility]    [phone]   [facility]    [phone]   [facility]  [address], [place] Phone:  [phone], Fax:  [phone] Hours of Operation:  9 am - 6 pm, M-F.  Also accepts Medicaid/Medicare and self-pay.  [facility]  [address], [place] Phone: [phone], Fax: [phone]. Hours of Operation:  8:30 am - 5:30 pm, M-F.  Also accepts Medicaid and self-pay.  [facility] [place] [address], [place] Phone: [phone]   [facility] [address] Natasha Bence [place], [place] [phone], Ext. [phone] Mondays & Thursdays: 7-9 AM.  First  15 patients are seen on a first come, first serve basis.    Medicaid-accepting [facility] Providers:  Organization         Address  Phone   Notes  [facility] [address], [place] [phone] Also accepts self-pay patients.  [facility] [address] Laurell Josephs [place], [place]  [phone]   [facility] [address], [place] [phone]   [facility] [address] [place] [phone]   Renaye Rakers [address], [place]   [phone] Only accepts [place] patients after they have their name applied to their card.   Self-Pay (no insurance) in [facility]:  Organization         Address  Phone   Notes  Sickle Cell Patients, [facility] [address] [place], [place] [phone]   [facility] [address] [place], [place] [phone]   [facility] [place]  [address], [place] [phone]   [facility]/Dr. Osei-Bonsu  [address], [place] or [address], [place] [phone] Phone number for both [place] and [place] locations is the same.  Urgent Medical and [facility] [address] [place] [phone]   [facility] [address], [place] or [address] [phone]   [facility] [address], [place] [phone], phone; [phone], fax Sees patients 1st and 3rd Saturday of every month.  Must not qualify for public or private insurance (i.e. Medicaid, Medicare, [place] Health Choice, Veterans' Benefits)  Household income should be no more than 200% of the poverty level The clinic cannot treat you if you are pregnant or think you are pregnant  Sexually transmitted diseases are not treated at the clinic.

## 2017-12-20 NOTE — ED Notes (Signed)
Urine Culture sent to Main Lab.

## 2017-12-20 NOTE — ED Notes (Signed)
Patient transported to Vascular 

## 2017-12-20 NOTE — Progress Notes (Signed)
*  Preliminary Results* Left lower extremity venous duplex completed. Left lower extremity is negative for deep vein thrombosis. There is no evidence of left Baker's cyst.  12/20/2017 3:38 PM  Margaret Pace Clare Gandy

## 2017-12-21 ENCOUNTER — Ambulatory Visit: Payer: Self-pay | Attending: Family Medicine | Admitting: Family Medicine

## 2017-12-21 ENCOUNTER — Encounter: Payer: Self-pay | Admitting: Family Medicine

## 2017-12-21 VITALS — BP 98/61 | HR 64 | Temp 97.6°F | Ht 62.0 in | Wt 142.6 lb

## 2017-12-21 DIAGNOSIS — Z79899 Other long term (current) drug therapy: Secondary | ICD-10-CM | POA: Insufficient documentation

## 2017-12-21 DIAGNOSIS — Z7984 Long term (current) use of oral hypoglycemic drugs: Secondary | ICD-10-CM | POA: Insufficient documentation

## 2017-12-21 DIAGNOSIS — E0800 Diabetes mellitus due to underlying condition with hyperosmolarity without nonketotic hyperglycemic-hyperosmolar coma (NKHHC): Secondary | ICD-10-CM

## 2017-12-21 DIAGNOSIS — G5603 Carpal tunnel syndrome, bilateral upper limbs: Secondary | ICD-10-CM | POA: Insufficient documentation

## 2017-12-21 DIAGNOSIS — I10 Essential (primary) hypertension: Secondary | ICD-10-CM | POA: Insufficient documentation

## 2017-12-21 DIAGNOSIS — B9689 Other specified bacterial agents as the cause of diseases classified elsewhere: Secondary | ICD-10-CM | POA: Insufficient documentation

## 2017-12-21 DIAGNOSIS — E1100 Type 2 diabetes mellitus with hyperosmolarity without nonketotic hyperglycemic-hyperosmolar coma (NKHHC): Secondary | ICD-10-CM | POA: Insufficient documentation

## 2017-12-21 DIAGNOSIS — B029 Zoster without complications: Secondary | ICD-10-CM | POA: Insufficient documentation

## 2017-12-21 DIAGNOSIS — N3000 Acute cystitis without hematuria: Secondary | ICD-10-CM | POA: Insufficient documentation

## 2017-12-21 LAB — POCT GLYCOSYLATED HEMOGLOBIN (HGB A1C): HbA1c, POC (controlled diabetic range): 6.6 % (ref 0.0–7.0)

## 2017-12-21 LAB — GLUCOSE, POCT (MANUAL RESULT ENTRY): POC GLUCOSE: 117 mg/dL — AB (ref 70–99)

## 2017-12-21 MED ORDER — VALACYCLOVIR HCL 1 G PO TABS: 1000.0000 mg | ORAL_TABLET | Freq: Three times a day (TID) | ORAL | 0 refills | Status: DC

## 2017-12-21 MED ORDER — ACETAMINOPHEN-CODEINE #3 300-30 MG PO TABS: 1.0000 | ORAL_TABLET | Freq: Two times a day (BID) | ORAL | 0 refills | Status: DC | PRN

## 2017-12-21 NOTE — Patient Instructions (Signed)
Culebrilla  (Shingles)  La culebrilla es una infección que causa una erupción dolorosa en la piel y ampollas llenas de líquido. La culebrilla está causada por el mismo virus que causa la varicela.  Solo se manifiesta en personas que:  · Tuvieron varicela.  · Recibieron la vacuna contra la varicela. (Esto es poco frecuente).  Los primeros síntomas de la culebrilla pueden ser picazón, hormigueo o dolor en una zona de la piel. Luego de unos días o semanas aparecerá una erupción cutánea. La erupción suele presentarse en un solo lado del cuerpo en un patrón con forma de cinto o de banda. Con el tiempo, la erupción se convierte en ampollas llenas de líquido que se abren, forman costras y se secan. Los medicamentos pueden tener los siguientes efectos:  · Ayudar a controlar el dolor.  · Lograr una recuperación más rápida.  · Prevenir problemas a largo plazo.  CUIDADOS EN EL HOGAR  Medicamentos  · Tome los medicamentos solamente como se lo haya indicado el médico.  · Aplique una crema para calmar la picazón o con anestesia en la zona afectada, como se lo haya indicado el médico.  Cuidado de la erupción y las ampollas  · Tome un baño de agua fría o aplique compresas frías en la zona de la erupción o las ampollas como se lo haya indicado el médico. Esto aliviará el dolor y la picazón.  · Mantenga la zona de la erupción cubierta con una venda (vendaje). Use ropas sueltas.  · Mantenga limpia la zona de la erupción y las ampollas con jabón suave y agua fría, o como se lo haya indicado el médico.  · Controle la erupción todos los días para detectar signos de infección. Estos signos incluyen enrojecimiento, hinchazón o dolor que perduran o empeoran.  · No pellizque las ampollas.  · No se rasque la zona de la erupción.  Instrucciones generales  · Haga reposo tal como le indicó el médico.  · Concurra a todas las visitas de control como se lo haya indicado el médico. Esto es importante.   · Hasta tanto las ampollas formen costras, la infección puede causar varicela en las personas que nunca la tuvieron o no se vacunaron contra la varicela. Para impedir que esto sucede, evite tocar a otras personas o estar cerca de otras personas, en especial:  ? Bebés.  ? Embarazadas.  ? Niños que tienen eccema.  ? Personas mayores que han recibido un trasplante.  ? Personas con enfermedades crónicas, como leucemia y sida.  SOLICITE AYUDA SI:  · El dolor no mejora con medicamentos.  · El dolor no mejora después de que la erupción desaparece.  · La erupción parece infectada. Los signos de infección son los siguiente:  ? Enrojecimiento.  ? Hinchazón.  ? Dolor que perdura o empeora.  SOLICITE AYUDA DE INMEDIATO SI:  · La erupción aparece en el rostro o la nariz.  · Tiene dolor en el rostro, en la zona de los ojos o pérdida de la sensibilidad en un lado del rostro.  · Siente dolor o un zumbido en el oído.  · Tiene pérdida del gusto.  · La afección empeora.  Esta información no tiene como fin reemplazar el consejo del médico. Asegúrese de hacerle al médico cualquier pregunta que tenga.  Document Released: 04/24/2008 Document Revised: 05/20/2015 Document Reviewed: 11/07/2013  Elsevier Interactive Patient Education © 2018 Elsevier Inc.

## 2017-12-21 NOTE — Progress Notes (Signed)
Subjective:  Patient ID: Margaret Pace, female    DOB: 1965/12/16  Age: 52 y.o. MRN: [id number]  CC: Diabetes   HPI Margaret Pace  is a 52 year old female with a history of type 2 diabetes mellitus (A1c 6.6) who presents today for follow-up visit. She had an ED visit yesterday where she had presented with nausea and vomiting to [facility], ER and was found to have a fever with elevated lactate, WBC was normal.  UA was positive for UTI, CT abdomen and pelvis was negative for acute intracranial abdominal or intrapelvic abnormalities.  She was treated with Bactrim and discharged. Today she denies fever or urinary symptoms and is yet to pick up her Bactrim from the pharmacy. She complains of an erythematous rash in her posterior left calf which she has had for the last 2 days.  Lower extremity DVT at the ED visit yesterday was negative. Rash is painful and itchy and radiates up to her left thigh.  Symptoms are exacerbated by weightbearing.  She has no rash in other body parts. Her A1c is 6.6 which has improved from 7.6 previously and she reports doing well on her current regimen.  She denies blurry vision, hypoglycemia or numbness in extremities.  CT abdomen and pelvis was negative for acute intracranial abdominal or intrapelvic abnormalities.  Past Medical History:  Diagnosis Date  . Anemia    Pt reported blood transfusion after left leg fracture.  . Carpal tunnel syndrome, bilateral   . Diabetes mellitus without complication [facility])     Past Surgical History:  Procedure Laterality Date  . APPLICATION OF WOUND VAC Left 05/14/2014   Procedure: APPLICATION OF WOUND VAC;  Surgeon: Myrene Galas, MD;  [facility];  Service: Orthopedics;  Laterality: Left;  . FASCIOTOMY Left 05/14/2014   Procedure: FOUR COMPARTMENT FASCIOTOMY;  Surgeon: Myrene Galas, MD;  Location: [facility] OR;  Service: Orthopedics;  Laterality: Left;  . FRACTURE SURGERY    . HARDWARE REMOVAL Left 10/19/2014   Procedure: HARDWARE  REMOVAL LEFT TIBIA AND ANKLE;  Surgeon: Myrene Galas, MD;  Location: [facility] OR;  Service: Orthopedics;  Laterality: Left;  . SECONDARY CLOSURE OF WOUND Left 05/17/2014   Procedure: WOUND CLOSURE LEFT LEG ;  Surgeon: Myrene Galas, MD;  [facility];  Service: Orthopedics;  Laterality: Left;  . TIBIA IM NAIL INSERTION Left 05/14/2014   Procedure: INTRAMEDULLARY (IM) NAIL TIBIAL;  Surgeon: Myrene Galas, MD;  Location: [facility] OR;  Service: Orthopedics;  Laterality: Left;    No Known Allergies   Outpatient Medications Prior to Visit  Medication Sig Dispense Refill  . atorvastatin (LIPITOR) 40 MG tablet Take 1 tablet (40 mg total) by mouth daily. 30 tablet 6  . Blood Glucose Monitoring Suppl (TRUE METRIX METER) DEVI 1 each by Does not apply route 3 (three) times daily before meals. 1 Device 0  . canagliflozin (INVOKANA) 100 MG TABS tablet Take 1 tablet (100 mg total) by mouth daily before breakfast. 30 tablet 6  . glipiZIDE (GLUCOTROL) 5 MG tablet Take 0.5 tablets (2.5 mg total) by mouth 2 (two) times daily before a meal. 30 tablet 6  . glucose blood (TRUE METRIX BLOOD GLUCOSE TEST) test strip Use three times daily 100 each 12  . lisinopril (PRINIVIL,ZESTRIL) 2.5 MG tablet Take 1 tablet (2.5 mg total) by mouth daily. 30 tablet 6  . metFORMIN (GLUCOPHAGE) 1000 MG tablet Take 1 tablet (1,000 mg total) by mouth 2 (two) times daily with a meal. 60 tablet 6  . naproxen (  NAPROSYN) 500 MG tablet TAKE 1 TABLET BY MOUTH 2 TIMES DAILY WITH A MEAL. 60 tablet 2  . sulfamethoxazole-trimethoprim (BACTRIM DS,SEPTRA DS) 800-160 MG tablet Take 1 tablet by mouth 2 (two) times daily for 14 days. 28 tablet 0  . TRUEPLUS LANCETS 28G MISC 1 each by Does not apply route 3 (three) times daily before meals. 100 each 12  . HYDROcodone-acetaminophen (NORCO/VICODIN) 5-325 MG tablet Take 1 tablet by mouth every 6 (six) hours as needed for severe pain. (Patient not taking: Reported on 09/21/2017) 6 tablet 0  . polyethylene  glycol-electrolytes (TRILYTE) 420 g solution Take 4,000 mLs by mouth as directed. (Patient not taking: Reported on 01/28/2017) 4000 mL 0  . acyclovir (ZOVIRAX) 400 MG tablet Take 1 tablet (400 mg total) by mouth 4 (four) times daily. (Patient not taking: Reported on 09/21/2017) 50 tablet 0   No facility-administered medications prior to visit.     ROS Review of Systems  Constitutional: Negative for activity change, appetite change and fatigue.  HENT: Negative for congestion, sinus pressure and sore throat.   Eyes: Negative for visual disturbance.  Respiratory: Negative for cough, chest tightness, shortness of breath and wheezing.   Cardiovascular: Negative for chest pain and palpitations.  Gastrointestinal: Negative for abdominal distention, abdominal pain and constipation.  Endocrine: Negative for polydipsia.  Genitourinary: Negative for dysuria and frequency.  Musculoskeletal: Negative for arthralgias and back pain.  Skin: Positive for rash.  Neurological: Negative for tremors, light-headedness and numbness.  Hematological: Does not bruise/bleed easily.  Psychiatric/Behavioral: Negative for agitation and behavioral problems.    Objective:  BP 98/61   Pulse 64   Temp 97.6 F (36.4 C) (Oral)   Ht 5\' 2"  (1.575 m)   Wt 142 lb 9.6 oz (64.7 kg)   LMP 11/12/2013   SpO2 100%   BMI 26.08 kg/m   BP/Weight 12/21/2017 12/20/2017 09/21/2017  Systolic BP 98 111 114  Diastolic BP 61 59 71  Wt. (Lbs) 142.6 140 147.4  BMI 26.08 25.61 26.96      Physical Exam  Constitutional: She is oriented to person, place, and time. She appears well-developed and well-nourished.  Cardiovascular: Normal rate, normal heart sounds and intact distal pulses.  No murmur heard. Pulmonary/Chest: Effort normal and breath sounds normal. She has no wheezes. She has no rales. She exhibits no tenderness.  Abdominal: Soft. Bowel sounds are normal. She exhibits no distension and no mass. There is no tenderness.    Musculoskeletal: Normal range of motion.  Neurological: She is alert and oriented to person, place, and time.  Skin:  Erythematous plaques in clusters on L4 dermatome of posterior left leg.  No vesicles present    Lab Results  Component Value Date   HGBA1C 6.6 12/21/2017     Assessment & Plan:   1. Diabetes mellitus due to underlying condition with hyperosmolarity without coma, without long-term current use of insulin (HCC) Controlled with A1c of 6.6 which has improved from 7.6 previously Continue current regimen Counseled on Diabetic diet, my plate method, 409 minutes of moderate intensity exercise/week Keep blood sugar logs with fasting goals of 80-120 mg/dl, random of less than 811 and in the event of sugars less than 60 mg/dl or greater than 914 mg/dl please notify the clinic ASAP. It is recommended that you undergo annual eye exams and annual foot exams. Pneumonia vaccine is recommended. - POCT glucose (manual entry) - POCT glycosylated hemoglobin (Hb A1C)  2. Essential hypertension Controlled  3. Herpes zoster without complication She  had a herpes zoster infection 3 months ago Advised that once acute episode has resolved and possible at her next visit she may need to receive the Shingrix - valACYclovir (VALTREX) 1000 MG tablet; Take 1 tablet (1,000 mg total) by mouth 3 (three) times daily.  Dispense: 21 tablet; Refill: 0 - acetaminophen-codeine (TYLENOL #3) 300-30 MG tablet; Take 1 tablet by mouth every 12 (twelve) hours as needed for moderate pain.  Dispense: 20 tablet; Refill: 0  4. Acute cystitis without hematuria Seen at the ED for this and prescribed Bactrim which she will be picking up from the pharmacy today.   Meds ordered this encounter  Medications  . valACYclovir (VALTREX) 1000 MG tablet    Sig: Take 1 tablet (1,000 mg total) by mouth 3 (three) times daily.    Dispense:  21 tablet    Refill:  0  . acetaminophen-codeine (TYLENOL #3) 300-30 MG tablet     Sig: Take 1 tablet by mouth every 12 (twelve) hours as needed for moderate pain.    Dispense:  20 tablet    Refill:  0    Follow-up: Return in about 3 months (around 03/23/2018) for Follow-up of chronic medical conditions.   Hoy Register MD

## 2017-12-21 NOTE — Progress Notes (Signed)
Patient has red rash going up her left leg.

## 2017-12-22 LAB — URINE CULTURE: Culture: 60000 — AB

## 2017-12-23 ENCOUNTER — Telehealth: Payer: Self-pay | Admitting: *Deleted

## 2017-12-23 NOTE — Telephone Encounter (Signed)
Post ED Visit - Positive Culture Follow-up  Culture report reviewed by antimicrobial stewardship pharmacist:  []  Enzo BiNathan Batchelder, Pharm.D. []  Celedonio MiyamotoJeremy Frens, [address]Ph[address]D., BCPS AQ-ID []  Garvin FilaMike Maccia, Pharm.D., BCPS []  Georgina PillionElizabeth Martin, Pharm.D., BCPS []  MarionMinh Pham, [address]D., BCPS, AAHIVP []  Estella HuskMichelle Turner, Pharm.D., BCPS, AAHIVP []  Lysle Pearlachel Rumbarger, PharmD, BCPS []  Phillips Climeshuy Dang, PharmD, BCPS []  Agapito GamesAlison Masters, PharmD, BCPS []  Verlan FriendsErin Deja, PharmD Bradley FerrisJoshua Tessin, PharmD  Positive urine culture Treated with Sulfamethoxazole-Trimethoprim, organism sensitive to the same and no further patient follow-up is required at this time.  Virl AxeRobertson, Amatullah Christy [facility] 12/23/2017, 10:35 AM

## 2018-01-19 ENCOUNTER — Other Ambulatory Visit: Payer: Self-pay | Admitting: Family Medicine

## 2018-01-19 DIAGNOSIS — M545 Low back pain, unspecified: Secondary | ICD-10-CM

## 2018-03-22 ENCOUNTER — Ambulatory Visit: Payer: Self-pay | Admitting: Family Medicine

## 2018-05-03 ENCOUNTER — Ambulatory Visit: Payer: Self-pay | Admitting: Family Medicine

## 2018-05-23 ENCOUNTER — Other Ambulatory Visit: Payer: Self-pay | Admitting: Family Medicine

## 2018-05-23 DIAGNOSIS — M545 Low back pain, unspecified: Secondary | ICD-10-CM

## 2018-06-20 ENCOUNTER — Other Ambulatory Visit: Payer: Self-pay

## 2018-06-20 ENCOUNTER — Ambulatory Visit: Payer: Self-pay | Attending: Family Medicine | Admitting: Family Medicine

## 2018-06-20 ENCOUNTER — Encounter: Payer: Self-pay | Admitting: Family Medicine

## 2018-06-20 DIAGNOSIS — E119 Type 2 diabetes mellitus without complications: Secondary | ICD-10-CM

## 2018-06-20 DIAGNOSIS — I10 Essential (primary) hypertension: Secondary | ICD-10-CM

## 2018-06-20 DIAGNOSIS — F4321 Adjustment disorder with depressed mood: Secondary | ICD-10-CM

## 2018-06-20 MED ORDER — ATORVASTATIN CALCIUM 40 MG PO TABS: 40.0000 mg | ORAL_TABLET | Freq: Every day | ORAL | 6 refills | Status: DC

## 2018-06-20 MED ORDER — GLIPIZIDE 5 MG PO TABS: 5.0000 mg | ORAL_TABLET | Freq: Two times a day (BID) | ORAL | 6 refills | Status: DC

## 2018-06-20 MED ORDER — LISINOPRIL 2.5 MG PO TABS: 2.5000 mg | ORAL_TABLET | Freq: Every day | ORAL | 6 refills | Status: DC

## 2018-06-20 MED ORDER — METFORMIN HCL 1000 MG PO TABS: 1000.0000 mg | ORAL_TABLET | Freq: Two times a day (BID) | ORAL | 6 refills | Status: DC

## 2018-06-20 MED ORDER — CANAGLIFLOZIN 100 MG PO TABS: 100.0000 mg | ORAL_TABLET | Freq: Every day | ORAL | 6 refills | Status: DC

## 2018-06-20 NOTE — Progress Notes (Signed)
Patient has been called and DOB has been verified. Patient has been screened and transferred to PCP to start phone visit.     

## 2018-06-20 NOTE — Progress Notes (Signed)
Virtual Visit via Telephone Note  I connected with Margaret Pace, on 06/20/2018 at 8:40am by telephone due to the COVID-19 pandemic and verified that I am speaking with the correct person using two identifiers.   Consent: I discussed the limitations, risks, security and privacy concerns of performing an evaluation and management service by telephone and the availability of in person appointments. I also discussed with the patient that there may be a patient responsible charge related to this service. The patient expressed understanding and agreed to proceed.   Location of Patient: Home  Location of Provider: Clinic   Persons participating in Telemedicine visit: Jessamine Barcia ID# [id number] Ozzie Hoyle Dr. Felecia Shelling     History of Present Illness: Margaret Pace  is a 53 year old female with a history of type 2 diabetes mellitus (A1c 6.6) who presents today for follow-up visit. Her fasting sugars have been elevated lately and running in the 130-140 range and this has been over the last 2 months ever since her daughter underwent bilateral amputation.  She denies hypoglycemia or blurry vision and has been compliant with her medications. Her daughters amputation has had depressed intermittently but she denies suicidal ideations or intent and states at this time she is doing okay. She has no additional concerns today   Past Medical History:  Diagnosis Date  . Anemia    Pt reported blood transfusion after left leg fracture.  . Carpal tunnel syndrome, bilateral   . Diabetes mellitus without complication ([place])    No Known Allergies  Current Outpatient Medications on File Prior to Visit  Medication Sig Dispense Refill  . acetaminophen-codeine (TYLENOL #3) 300-30 MG tablet Take 1 tablet by mouth every 12 (twelve) hours as needed for moderate pain. 20 tablet 0  . Blood Glucose Monitoring Suppl (TRUE METRIX METER) DEVI 1 each by Does not apply route 3 (three) times daily  before meals. 1 Device 0  . glucose blood (TRUE METRIX BLOOD GLUCOSE TEST) test strip Use three times daily 100 each 12  . naproxen (NAPROSYN) 500 MG tablet TAKE 1 TABLET BY MOUTH 2 TIMES DAILY WITH A MEAL. 60 tablet 0  . TRUEPLUS LANCETS 28G MISC 1 each by Does not apply route 3 (three) times daily before meals. 100 each 12  . valACYclovir (VALTREX) 1000 MG tablet Take 1 tablet (1,000 mg total) by mouth 3 (three) times daily. 21 tablet 0  . HYDROcodone-acetaminophen (NORCO/VICODIN) 5-325 MG tablet Take 1 tablet by mouth every 6 (six) hours as needed for severe pain. (Patient not taking: Reported on 09/21/2017) 6 tablet 0  . polyethylene glycol-electrolytes (TRILYTE) 420 g solution Take 4,000 mLs by mouth as directed. (Patient not taking: Reported on 01/28/2017) 4000 mL 0   No current facility-administered medications on file prior to visit.     Observations/Objective: Awake, alert, oriented x3 Not in acute distress  CMP Latest Ref Rng & Units 12/20/2017 03/29/2017 07/29/2016  Glucose 70 - 99 mg/dL 124(H) 124(H) 81  BUN 6 - 20 mg/dL 24(H) 25(H) 12  Creatinine 0.44 - 1.00 mg/dL 0.96 0.85 0.87  Sodium 135 - 145 mmol/L 134(L) 142 137  Potassium 3.5 - 5.1 mmol/L 3.9 4.9 4.1  Chloride 98 - 111 mmol/L 103 105 105  CO2 22 - 32 mmol/L 21(L) 20 23  Calcium 8.9 - 10.3 mg/dL 9.6 10.2 9.2  Total Protein 6.5 - 8.1 g/dL 7.8 7.9 7.2  Total Bilirubin 0.3 - 1.2 mg/dL 0.8 0.3 0.6  Alkaline Phos 38 - 126 U/L 108 135(H)  123  AST 15 - 41 U/L '22 23 30  ' ALT 0 - 44 U/L '13 17 20    ' Lipid Panel     Component Value Date/Time   CHOL 93 (L) 03/29/2017 0834   TRIG 82 03/29/2017 0834   HDL 40 03/29/2017 0834   CHOLHDL 2.3 03/29/2017 0834   CHOLHDL 3.3 04/14/2016 0928   VLDL 45 (H) 09/25/2015 1144   LDLCALC 37 03/29/2017 0834    Lab Results  Component Value Date   HGBA1C 6.6 12/21/2017     Assessment and Plan: 1. Type 2 diabetes mellitus without complication, without long-term current use of insulin  (HCC) Controlled with A1c of 6.6 Blood sugars reveal suboptimal control Increase glipizide from 2.5 mg twice daily to 5 mg twice daily Counseled on Diabetic diet, my plate method, 223 minutes of moderate intensity exercise/week Keep blood sugar logs with fasting goals of 80-120 mg/dl, random of less than 180 and in the event of sugars less than 60 mg/dl or greater than 400 mg/dl please notify the clinic ASAP. It is recommended that you undergo annual eye exams and annual foot exams. Pneumonia vaccine is recommended. - atorvastatin (LIPITOR) 40 MG tablet; Take 1 tablet (40 mg total) by mouth daily.  Dispense: 30 tablet; Refill: 6 - canagliflozin (INVOKANA) 100 MG TABS tablet; Take 1 tablet (100 mg total) by mouth daily before breakfast.  Dispense: 30 tablet; Refill: 6 - glipiZIDE (GLUCOTROL) 5 MG tablet; Take 1 tablet (5 mg total) by mouth 2 (two) times daily before a meal.  Dispense: 60 tablet; Refill: 6 - metFORMIN (GLUCOPHAGE) 1000 MG tablet; Take 1 tablet (1,000 mg total) by mouth 2 (two) times daily with a meal.  Dispense: 60 tablet; Refill: 6 - CMP14+EGFR; Future - Lipid panel; Future - Microalbumin/Creatinine Ratio, Urine; Future  2. Essential hypertension Stable Low-sodium diet - lisinopril (ZESTRIL) 2.5 MG tablet; Take 1 tablet (2.5 mg total) by mouth daily.  Dispense: 30 tablet; Refill: 6  3. Situational depression Secondary to daughter recently undergoing bilateral amputation She declines LCSW counseling Also declines pharmacotherapy as she states she is doing well.   Follow Up Instructions: Return in about 3 months (around 09/20/2018) for follow upof chronic medical conditions.    I discussed the assessment and treatment plan with the patient. The patient was provided an opportunity to ask questions and all were answered. The patient agreed with the plan and demonstrated an understanding of the instructions.   The patient was advised to call back or seek an in-person  evaluation if the symptoms worsen or if the condition fails to improve as anticipated.     I provided 11 minutes total of non-face-to-face time during this encounter including median intraservice time, reviewing previous notes, labs, imaging, medications and explaining diagnosis and management.     Charlott Rakes, MD, FAAFP. [facility], [place]   06/20/2018, 9:32 AM

## 2018-06-23 ENCOUNTER — Other Ambulatory Visit: Payer: Self-pay

## 2018-06-23 ENCOUNTER — Ambulatory Visit: Payer: Self-pay | Attending: Family Medicine

## 2018-06-23 DIAGNOSIS — E119 Type 2 diabetes mellitus without complications: Secondary | ICD-10-CM

## 2018-06-24 LAB — CMP14+EGFR
ALT: 14 IU/L (ref 0–32)
AST: 17 IU/L (ref 0–40)
Albumin/Globulin Ratio: 1.6 (ref 1.2–2.2)
Albumin: 4.3 g/dL (ref 3.8–4.9)
Alkaline Phosphatase: 127 IU/L — ABNORMAL HIGH (ref 39–117)
BUN/Creatinine Ratio: 27 — ABNORMAL HIGH (ref 9–23)
BUN: 20 mg/dL (ref 6–24)
Bilirubin Total: 0.3 mg/dL (ref 0.0–1.2)
CO2: 21 mmol/L (ref 20–29)
Calcium: 9.9 mg/dL (ref 8.7–10.2)
Chloride: 105 mmol/L (ref 96–106)
Creatinine, Ser: 0.74 mg/dL (ref 0.57–1.00)
GFR calc Af Amer: 108 mL/min/{1.73_m2} (ref >59)
GFR calc non Af Amer: 93 mL/min/{1.73_m2} (ref >59)
Globulin, Total: 2.7 g/dL (ref 1.5–4.5)
Glucose: 77 mg/dL (ref 65–99)
Potassium: 4.7 mmol/L (ref 3.5–5.2)
Sodium: 141 mmol/L (ref 134–144)
Total Protein: 7 g/dL (ref 6.0–8.5)

## 2018-06-24 LAB — LIPID PANEL
Chol/HDL Ratio: 2.1 ratio (ref 0.0–4.4)
Cholesterol, Total: 90 mg/dL — ABNORMAL LOW (ref 100–199)
HDL: 42 mg/dL (ref >39)
LDL Calculated: 31 mg/dL (ref 0–99)
Triglycerides: 85 mg/dL (ref 0–149)
VLDL Cholesterol Cal: 17 mg/dL (ref 5–40)

## 2018-06-24 LAB — MICROALBUMIN / CREATININE URINE RATIO
Creatinine, Urine: 64.7 mg/dL
Microalb/Creat Ratio: 12 mg/g creat (ref 0–29)
Microalbumin, Urine: 7.9 ug/mL

## 2018-07-08 ENCOUNTER — Telehealth: Payer: Self-pay

## 2018-07-08 NOTE — Telephone Encounter (Signed)
Patient name and DOB has been verified Patient was informed of lab results. Patient had no questions.[phone])

## 2018-07-08 NOTE — Telephone Encounter (Signed)
-----   Message from Hoy Register, MD sent at 06/24/2018 10:37 AM EDT ----- Please inform the patient that labs are normal. Thank you.

## 2018-08-08 ENCOUNTER — Other Ambulatory Visit: Payer: Self-pay | Admitting: Family Medicine

## 2018-08-08 DIAGNOSIS — M545 Low back pain, unspecified: Secondary | ICD-10-CM

## 2018-09-20 ENCOUNTER — Other Ambulatory Visit: Payer: Self-pay | Admitting: Family Medicine

## 2018-09-20 DIAGNOSIS — M545 Low back pain, unspecified: Secondary | ICD-10-CM

## 2018-09-22 ENCOUNTER — Other Ambulatory Visit: Payer: Self-pay | Admitting: Family Medicine

## 2018-09-22 DIAGNOSIS — E119 Type 2 diabetes mellitus without complications: Secondary | ICD-10-CM

## 2018-10-26 ENCOUNTER — Other Ambulatory Visit: Payer: Self-pay

## 2018-10-26 ENCOUNTER — Encounter: Payer: Self-pay | Admitting: Family Medicine

## 2018-10-26 ENCOUNTER — Ambulatory Visit: Payer: Self-pay | Attending: Family Medicine | Admitting: Family Medicine

## 2018-10-26 VITALS — BP 108/68 | HR 62 | Temp 98.3°F | Ht 62.0 in | Wt 154.2 lb

## 2018-10-26 DIAGNOSIS — Z1211 Encounter for screening for malignant neoplasm of colon: Secondary | ICD-10-CM

## 2018-10-26 DIAGNOSIS — M778 Other enthesopathies, not elsewhere classified: Secondary | ICD-10-CM

## 2018-10-26 DIAGNOSIS — I10 Essential (primary) hypertension: Secondary | ICD-10-CM

## 2018-10-26 DIAGNOSIS — E1165 Type 2 diabetes mellitus with hyperglycemia: Secondary | ICD-10-CM

## 2018-10-26 DIAGNOSIS — E0800 Diabetes mellitus due to underlying condition with hyperosmolarity without nonketotic hyperglycemic-hyperosmolar coma (NKHHC): Secondary | ICD-10-CM

## 2018-10-26 LAB — POCT GLYCOSYLATED HEMOGLOBIN (HGB A1C): HbA1c, POC (controlled diabetic range): 8.3 % — AB (ref 0.0–7.0)

## 2018-10-26 LAB — GLUCOSE, POCT (MANUAL RESULT ENTRY): POC Glucose: 114 mg/dl — AB (ref 70–99)

## 2018-10-26 MED ORDER — LISINOPRIL 2.5 MG PO TABS: 2.5000 mg | ORAL_TABLET | Freq: Every day | ORAL | 6 refills | Status: DC

## 2018-10-26 MED ORDER — ATORVASTATIN CALCIUM 40 MG PO TABS: 40.0000 mg | ORAL_TABLET | Freq: Every day | ORAL | 6 refills | Status: DC

## 2018-10-26 MED ORDER — CANAGLIFLOZIN 100 MG PO TABS: 100.0000 mg | ORAL_TABLET | Freq: Every day | ORAL | 6 refills | Status: DC

## 2018-10-26 MED ORDER — GLIPIZIDE 10 MG PO TABS: 10.0000 mg | ORAL_TABLET | Freq: Two times a day (BID) | ORAL | 6 refills | Status: DC

## 2018-10-26 MED ORDER — METFORMIN HCL 1000 MG PO TABS: 1000.0000 mg | ORAL_TABLET | Freq: Two times a day (BID) | ORAL | 6 refills | Status: DC

## 2018-10-26 MED ORDER — NAPROXEN 500 MG PO TABS: 500.0000 mg | ORAL_TABLET | Freq: Two times a day (BID) | ORAL | 0 refills | Status: DC

## 2018-10-26 MED ORDER — PREDNISONE 20 MG PO TABS: 20.0000 mg | ORAL_TABLET | Freq: Every day | ORAL | 0 refills | Status: DC

## 2018-10-26 NOTE — Progress Notes (Signed)
Patient is having pain in wrist.

## 2018-10-26 NOTE — Patient Instructions (Signed)
Diabetes mellitus y nutricin, en adultos Diabetes Mellitus and Nutrition, Adult Si sufre de diabetes (diabetes mellitus), es muy importante tener hbitos alimenticios saludables debido a que sus niveles de azcar en la sangre (glucosa) se ven afectados en gran medida por lo que come y bebe. Comer alimentos saludables en las cantidades adecuadas, aproximadamente a la misma hora todos los das, lo ayudar a:  Controlar la glucemia.  Disminuir el riesgo de sufrir una enfermedad cardaca.  Mejorar la presin arterial.  Alcanzar o mantener un peso saludable. Todas las personas que sufren de diabetes son diferentes y cada una tiene necesidades diferentes en cuanto a un plan de alimentacin. El mdico puede recomendarle que trabaje con un especialista en dietas y nutricin (nutricionista) para elaborar el mejor plan para usted. Su plan de alimentacin puede variar segn factores como:  Las caloras que necesita.  Los medicamentos que toma.  Su peso.  Sus niveles de glucemia, presin arterial y colesterol.  Su nivel de actividad.  Otras afecciones que tenga, como enfermedades cardacas o renales. Cmo me afectan los carbohidratos? Los carbohidratos, o hidratos de carbono, afectan su nivel de glucemia ms que cualquier otro tipo de alimento. La ingesta de carbohidratos naturalmente aumenta la cantidad de glucosa en la sangre. El recuento de carbohidratos es un mtodo destinado a llevar un registro de la cantidad de carbohidratos que se consumen. El recuento de carbohidratos es importante para mantener la glucemia a un nivel saludable, especialmente si utiliza insulina o toma determinados medicamentos por va oral para la diabetes. Es importante conocer la cantidad de carbohidratos que se pueden ingerir en cada comida sin correr ningn riesgo. Esto es diferente en cada persona. Su nutricionista puede ayudarlo a calcular la cantidad de carbohidratos que debe ingerir en cada comida y en cada  refrigerio. Entre los alimentos que contienen carbohidratos, se incluyen:  Pan, cereal, arroz, pastas y galletas.  Papas y maz.  Guisantes, frijoles y lentejas.  Leche y yogur.  Frutas y jugo.  Postres, como pasteles, galletas, helado y caramelos. Cmo me afecta el alcohol? El alcohol puede provocar disminuciones sbitas de la glucemia (hipoglucemia), especialmente si utiliza insulina o toma determinados medicamentos por va oral para la diabetes. La hipoglucemia es una afeccin potencialmente mortal. Los sntomas de la hipoglucemia (somnolencia, mareos y confusin) son similares a los sntomas de haber consumido demasiado alcohol. Si el mdico afirma que el alcohol es seguro para usted, siga estas pautas:  Limite el consumo de alcohol a no ms de 1medida por da si es mujer y no est embarazada, y a 2medidas si es hombre. Una medida equivale a 12oz (355ml) de cerveza, 5oz (148ml) de vino o 1oz (44ml) de bebidas alcohlicas de alta graduacin.  No beba con el estmago vaco.  Mantngase hidratado bebiendo agua, refrescos dietticos o t helado sin azcar.  Tenga en cuenta que los refrescos comunes, los jugos y otras bebida para mezclar pueden contener mucha azcar y se deben contar como carbohidratos. Cules son algunos consejos para seguir este plan?  Leer las etiquetas de los alimentos  Comience por leer el tamao de la porcin en la "Informacin nutricional" en las etiquetas de los alimentos envasados y las bebidas. La cantidad de caloras, carbohidratos, grasas y otros nutrientes mencionados en la etiqueta se basan en una porcin del alimento. Muchos alimentos contienen ms de una porcin por envase.  Verifique la cantidad total de gramos (g) de carbohidratos totales en una porcin. Puede calcular la cantidad de porciones de carbohidratos al dividir el   total de carbohidratos por 15. Por ejemplo, si un alimento tiene un total de 30g de carbohidratos, equivale a 2  porciones de carbohidratos.  Verifique la cantidad de gramos (g) de grasas saturadas y grasas trans en una porcin. Escoja alimentos que no contengan grasa o que tengan un bajo contenido.  Verifique la cantidad de miligramos (mg) de sal (sodio) en una porcin. La mayora de las personas deben limitar la ingesta de sodio total a menos de 2300mg por da.  Siempre consulte la informacin nutricional de los alimentos etiquetados como "con bajo contenido de grasa" o "sin grasa". Estos alimentos pueden tener un mayor contenido de azcar agregada o carbohidratos refinados, y deben evitarse.  Hable con su nutricionista para identificar sus objetivos diarios en cuanto a los nutrientes mencionados en la etiqueta. Al ir de compras  Evite comprar alimentos procesados, enlatados o precocinados. Estos alimentos tienden a tener una mayor cantidad de grasa, sodio y azcar agregada.  Compre en la zona exterior de la tienda de comestibles. Esta zona incluye frutas y verduras frescas, granos a granel, carnes frescas y productos lcteos frescos. Al cocinar  Utilice mtodos de coccin a baja temperatura, como hornear, en lugar de mtodos de coccin a alta temperatura, como frer en abundante aceite.  Cocine con aceites saludables, como el aceite de oliva, canola o girasol.  Evite cocinar con manteca, crema o carnes con alto contenido de grasa. Planificacin de las comidas  Coma las comidas y los refrigerios regularmente, preferentemente a la misma hora todos los das. Evite pasar largos perodos de tiempo sin comer.  Consuma alimentos ricos en fibra, como frutas frescas, verduras, frijoles y cereales integrales. Consulte a su nutricionista sobre cuntas porciones de carbohidratos puede consumir en cada comida.  Consuma entre 4 y 6 onzas (oz) de protenas magras por da, como carnes magras, pollo, pescado, huevos o tofu. Una onza de protena magra equivale a: ? 1 onza de carne, pollo o pescado. ? 1huevo. ?   taza de tofu.  Coma algunos alimentos por da que contengan grasas saludables, como aguacates, frutos secos, semillas y pescado. Estilo de vida  Controle su nivel de glucemia con regularidad.  Haga actividad fsica habitualmente como se lo haya indicado el mdico. Esto puede incluir lo siguiente: ? 150minutos semanales de ejercicio de intensidad moderada o alta. Esto podra incluir caminatas dinmicas, ciclismo o gimnasia acutica. ? Realizar ejercicios de elongacin y de fortalecimiento, como yoga o levantamiento de pesas, por lo menos 2veces por semana.  Tome los medicamentos como se lo haya indicado el mdico.  No consuma ningn producto que contenga nicotina o tabaco, como cigarrillos y cigarrillos electrnicos. Si necesita ayuda para dejar de fumar, consulte al mdico.  Trabaje con un asesor o instructor en diabetes para identificar estrategias para controlar el estrs y cualquier desafo emocional y social. Preguntas para hacerle al mdico  Es necesario que consulte a un instructor en el cuidado de la diabetes?  Es necesario que me rena con un nutricionista?  A qu nmero puedo llamar si tengo preguntas?  Cules son los mejores momentos para controlar la glucemia? Dnde encontrar ms informacin:  Asociacin Est[place]ounidense de la Diabetes (American Diabetes Association): diabetes.org  Academia de Nutricin y Diettica (Academy of Nutrition and Dietetics): www.eatright.org  Instituto Nacional de la Diabetes y las Enfermedades Digestivas y Renales (National Institute of Diabetes and Digestive and Kidney Diseases, NIH): www.niddk.nih.gov Resumen  Un plan de alimentacin saludable lo ayudar a controlar la glucemia y mantener un estilo de vida saludable.    Trabajar con un especialista en dietas y nutricin (nutricionista) puede ayudarlo a elaborar el mejor plan de alimentacin para usted.  Tenga en cuenta que los carbohidratos (hidratos de carbono) y el alcohol tienen  efectos inmediatos en sus niveles de glucemia. Es importante contar los carbohidratos que ingiere y consumir alcohol con prudencia. Esta informacin no tiene como fin reemplazar el consejo del mdico. Asegrese de hacerle al mdico cualquier pregunta que tenga. Document Released: 05/05/2007 Document Revised: 10/06/2016 Document Reviewed: 05/18/2016 Elsevier Patient Education  2020 [employer]  

## 2018-10-26 NOTE — Progress Notes (Signed)
Subjective:  Patient ID: Margaret Pace, female    DOB: 01/05/1966  Age: 53 y.o. MRN: [id number]  CC: Diabetes   HPI Margaret Pace Margaret Pace  is a 53 year old female with a history of type 2 diabetes mellitus (A1c 8.3) who presents today for follow-up visit. Her A1c is 8.3 which has trended up from 6.6 previously despite compliance with her current regimen. Compliance with a diabetic diet cannot be ascertained and she has not been exercising regularly.  Denies hypoglycemia, numbness in extremities or visual concerns.  Not up-to-date on annual eye exam. Doing well on her antihypertensive and denies adverse effects from her medication. Today she complains of right wrist pain on the dorsum of her right wrist which is worse at night and hurts when she extends her wrist.  She denies numbness or tingling in her hands and denies reduced handgrip.  Her chart reveals a previous history of bilateral carpal tunnel syndrome and she is status post cortisone injections which she states were ineffective.  Past Medical History:  Diagnosis Date  . Anemia    Pt reported blood transfusion after left leg fracture.  . Carpal tunnel syndrome, bilateral   . Diabetes mellitus without complication [facility](HCC)     Past Surgical History:  Procedure Laterality Date  . APPLICATION OF WOUND VAC Left 05/14/2014   Procedure: APPLICATION OF WOUND VAC;  Surgeon: Myrene GalasMichael Handy, MD;  [facility];  Service: Orthopedics;  Laterality: Left;  . FASCIOTOMY Left 05/14/2014   Procedure: FOUR COMPARTMENT FASCIOTOMY;  Surgeon: Myrene GalasMichael Handy, MD;  [facility];  Service: Orthopedics;  Laterality: Left;  . FRACTURE SURGERY    . HARDWARE REMOVAL Left 10/19/2014   Procedure: HARDWARE REMOVAL LEFT TIBIA AND ANKLE;  Surgeon: Myrene GalasMichael Handy, MD;  [facility];  Service: Orthopedics;  Laterality: Left;  . SECONDARY CLOSURE OF WOUND Left 05/17/2014   Procedure: WOUND CLOSURE LEFT LEG ;  Surgeon: Myrene GalasMichael Handy, MD;  [facility];   Service: Orthopedics;  Laterality: Left;  . TIBIA IM NAIL INSERTION Left 05/14/2014   Procedure: INTRAMEDULLARY (IM) NAIL TIBIAL;  Surgeon: Myrene GalasMichael Handy, MD;  [facility];  Service: Orthopedics;  Laterality: Left;    Family History  Problem Relation Age of Onset  . Diabetes Mother   . Diabetes Sister   . Diabetes Brother   . Diabetes Sister   . Diabetes Sister   . Diabetes Brother     No Known Allergies  Outpatient Medications Prior to Visit  Medication Sig Dispense Refill  . Blood Glucose Monitoring Suppl (TRUE METRIX METER) DEVI 1 each by Does not apply route 3 (three) times daily before meals. 1 Device 0  . glucose blood test strip USE AS DIRECTED THREE TIMES DAILY 100 each 12  . TRUEPLUS LANCETS 28G MISC 1 each by Does not apply route 3 (three) times daily before meals. 100 each 12  . atorvastatin (LIPITOR) 40 MG tablet Take 1 tablet (40 mg total) by mouth daily. 30 tablet 6  . canagliflozin (INVOKANA) 100 MG TABS tablet Take 1 tablet (100 mg total) by mouth daily before breakfast. 30 tablet 6  . glipiZIDE (GLUCOTROL) 5 MG tablet Take 1 tablet (5 mg total) by mouth 2 (two) times daily before a meal. 60 tablet 6  . lisinopril (ZESTRIL) 2.5 MG tablet Take 1 tablet (2.5 mg total) by mouth daily. 30 tablet 6  . metFORMIN (GLUCOPHAGE) 1000 MG tablet Take 1 tablet (1,000 mg total) by mouth 2 (two) times daily with a  meal. 60 tablet 6  . HYDROcodone-acetaminophen (NORCO/VICODIN) 5-325 MG tablet Take 1 tablet by mouth every 6 (six) hours as needed for severe pain. (Patient not taking: Reported on 09/21/2017) 6 tablet 0  . polyethylene glycol-electrolytes (TRILYTE) 420 g solution Take 4,000 mLs by mouth as directed. (Patient not taking: Reported on 01/28/2017) 4000 mL 0  . valACYclovir (VALTREX) 1000 MG tablet Take 1 tablet (1,000 mg total) by mouth 3 (three) times daily. (Patient not taking: Reported on 10/26/2018) 21 tablet 0  . acetaminophen-codeine (TYLENOL #3) 300-30 MG tablet Take 1  tablet by mouth every 12 (twelve) hours as needed for moderate pain. (Patient not taking: Reported on 10/26/2018) 20 tablet 0  . naproxen (NAPROSYN) 500 MG tablet TAKE 1 TABLET BY MOUTH 2 TIMES DAILY WITH A MEAL. (Patient not taking: Reported on 10/26/2018) 60 tablet 0   No facility-administered medications prior to visit.      ROS Review of Systems  Constitutional: Negative for activity change, appetite change and fatigue.  HENT: Negative for congestion, sinus pressure and sore throat.   Eyes: Negative for visual disturbance.  Respiratory: Negative for cough, chest tightness, shortness of breath and wheezing.   Cardiovascular: Negative for chest pain and palpitations.  Gastrointestinal: Negative for abdominal distention, abdominal pain and constipation.  Endocrine: Negative for polydipsia.  Genitourinary: Negative for dysuria and frequency.  Musculoskeletal:       See hpi  Skin: Negative for rash.  Neurological: Negative for tremors, light-headedness and numbness.  Hematological: Does not bruise/bleed easily.  Psychiatric/Behavioral: Negative for agitation and behavioral problems.    Objective:  BP 108/68   Pulse 62   Temp 98.3 F (36.8 C) (Oral)   Ht 5\' 2"  (1.575 m)   Wt 154 lb 3.2 oz (69.9 kg)   LMP 11/12/2013   SpO2 100%   BMI 28.20 kg/m   BP/Weight 10/26/2018 12/21/2017 12/20/2017  Systolic BP 108 98 111  Diastolic BP 68 61 59  Wt. (Lbs) 154.2 142.6 140  BMI 28.2 26.08 25.61      Physical Exam Constitutional:      Appearance: She is well-developed.  Cardiovascular:     Rate and Rhythm: Normal rate.     Heart sounds: Normal heart sounds. No murmur.  Pulmonary:     Effort: Pulmonary effort is normal.     Breath sounds: Normal breath sounds. No wheezing or rales.  Chest:     Chest wall: No tenderness.  Abdominal:     General: Bowel sounds are normal. There is no distension.     Palpations: Abdomen is soft. There is no mass.     Tenderness: There is no  abdominal tenderness.  Musculoskeletal:     Comments: Tenderness in midpoint of dorsum of wrist bilaterally Negative Phalen's and Tinel sign Normal handgrip bilaterally  Neurological:     Mental Status: She is alert and oriented to person, place, and time.     CMP Latest Ref Rng & Units 06/23/2018 12/20/2017 03/29/2017  Glucose 65 - 99 mg/dL 77 161(W124(H) 960(A124(H)  BUN 6 - 24 mg/dL 20 54(U24(H) 98(J25(H)  Creatinine 0.57 - 1.00 mg/dL 1.910.74 4.780.96 2.950.85  Sodium 134 - 144 mmol/L 141 134(L) 142  Potassium 3.5 - 5.2 mmol/L 4.7 3.9 4.9  Chloride 96 - 106 mmol/L 105 103 105  CO2 20 - 29 mmol/L 21 21(L) 20  Calcium 8.7 - 10.2 mg/dL 9.9 9.6 62.110.2  Total Protein 6.0 - 8.5 g/dL 7.0 7.8 7.9  Total Bilirubin 0.0 - 1.2 mg/dL  0.3 0.8 0.3  Alkaline Phos 39 - 117 IU/L 127(H) 108 135(H)  AST 0 - 40 IU/L 17 22 23   ALT 0 - 32 IU/L 14 13 17     Lipid Panel     Component Value Date/Time   CHOL 90 (L) 06/23/2018 0910   TRIG 85 06/23/2018 0910   HDL 42 06/23/2018 0910   CHOLHDL 2.1 06/23/2018 0910   CHOLHDL 3.3 04/14/2016 0928   VLDL 45 (H) 09/25/2015 1144   LDLCALC 31 06/23/2018 0910    CBC    Component Value Date/Time   WBC 9.5 12/20/2017 1403   RBC 4.38 12/20/2017 1403   HGB 12.6 12/20/2017 1403   HCT 40.3 12/20/2017 1403   PLT 212 12/20/2017 1403   MCV 92.0 12/20/2017 1403   MCH 28.8 12/20/2017 1403   MCHC 31.3 12/20/2017 1403   RDW 14.2 12/20/2017 1403   LYMPHSABS 0.6 (L) 12/20/2017 1403   MONOABS 0.6 12/20/2017 1403   EOSABS 0.0 12/20/2017 1403   BASOSABS 0.0 12/20/2017 1403    Lab Results  Component Value Date   HGBA1C 8.3 (A) 10/26/2018    Assessment & Plan:   1. Type 2 diabetes mellitus with hyperglycemia, without long-term current use of insulin (HCC) Uncontrolled with A1c of 8.3 Increase glipizide from 5 mg twice daily to 10 mg twice daily.  Discussed hypoglycemic side effects of glipizide and she is to notify the clinic if this occurs and cut back to 5 mg twice daily Counseled on  Diabetic diet, my plate method, 220 minutes of moderate intensity exercise/week Keep blood sugar logs with fasting goals of 80-120 mg/dl, random of less than 254 and in the event of sugars less than 60 mg/dl or greater than 270 mg/dl please notify the clinic ASAP. It is recommended that you undergo annual eye exams and annual foot exams. Pneumonia vaccine is recommended. - POCT glucose (manual entry) - POCT glycosylated hemoglobin (Hb A1C) - atorvastatin (LIPITOR) 40 MG tablet; Take 1 tablet (40 mg total) by mouth daily.  Dispense: 30 tablet; Refill: 6 - canagliflozin (INVOKANA) 100 MG TABS tablet; Take 1 tablet (100 mg total) by mouth daily before breakfast.  Dispense: 30 tablet; Refill: 6 - glipiZIDE (GLUCOTROL) 10 MG tablet; Take 1 tablet (10 mg total) by mouth 2 (two) times daily before a meal.  Dispense: 60 tablet; Refill: 6 - metFORMIN (GLUCOPHAGE) 1000 MG tablet; Take 1 tablet (1,000 mg total) by mouth 2 (two) times daily with a meal.  Dispense: 60 tablet; Refill: 6 - Basic Metabolic Panel  2. Essential hypertension Controlled Counseled on blood pressure goal of less than 130/80, low-sodium, DASH diet, medication compliance, 150 minutes of moderate intensity exercise per week. Discussed medication compliance, adverse effects. - lisinopril (ZESTRIL) 2.5 MG tablet; Take 1 tablet (2.5 mg total) by mouth daily.  Dispense: 30 tablet; Refill: 6  3. Right wrist tendinitis With history of carpal tunnel syndrome If symptoms persist will consider referral to orthopedic - predniSONE (DELTASONE) 20 MG tablet; Take 1 tablet (20 mg total) by mouth daily with breakfast.  Dispense: 5 tablet; Refill: 0 - naproxen (NAPROSYN) 500 MG tablet; Take 1 tablet (500 mg total) by mouth 2 (two) times daily with a meal.  Dispense: 60 tablet; Refill: 0  4. Screening for colon cancer - Fecal occult blood, imunochemical([facility])   Health Care Maintenance: Needs annual eye exam; discussed available  community resources given she has no medical coverage for referral to ophthalmology  Meds ordered this encounter  Medications  .  atorvastatin (LIPITOR) 40 MG tablet    Sig: Take 1 tablet (40 mg total) by mouth daily.    Dispense:  30 tablet    Refill:  6  . lisinopril (ZESTRIL) 2.5 MG tablet    Sig: Take 1 tablet (2.5 mg total) by mouth daily.    Dispense:  30 tablet    Refill:  6  . canagliflozin (INVOKANA) 100 MG TABS tablet    Sig: Take 1 tablet (100 mg total) by mouth daily before breakfast.    Dispense:  30 tablet    Refill:  6  . glipiZIDE (GLUCOTROL) 10 MG tablet    Sig: Take 1 tablet (10 mg total) by mouth 2 (two) times daily before a meal.    Dispense:  60 tablet    Refill:  6  . metFORMIN (GLUCOPHAGE) 1000 MG tablet    Sig: Take 1 tablet (1,000 mg total) by mouth 2 (two) times daily with a meal.    Dispense:  60 tablet    Refill:  6  . predniSONE (DELTASONE) 20 MG tablet    Sig: Take 1 tablet (20 mg total) by mouth daily with breakfast.    Dispense:  5 tablet    Refill:  0  . naproxen (NAPROSYN) 500 MG tablet    Sig: Take 1 tablet (500 mg total) by mouth 2 (two) times daily with a meal.    Dispense:  60 tablet    Refill:  0    Follow-up: Return in about 3 months (around 01/25/2019) for medical conditions - virtual visit.       Charlott Rakes, MD, FAAFP. [facility] [address] [place], [place]   10/26/2018, 11:24 AM

## 2018-10-27 LAB — BASIC METABOLIC PANEL
BUN/Creatinine Ratio: 32 — ABNORMAL HIGH (ref 9–23)
BUN: 28 mg/dL — ABNORMAL HIGH (ref 6–24)
CO2: 24 mmol/L (ref 20–29)
Calcium: 9.9 mg/dL (ref 8.7–10.2)
Chloride: 105 mmol/L (ref 96–106)
Creatinine, Ser: 0.87 mg/dL (ref 0.57–1.00)
GFR calc Af Amer: 89 mL/min/{1.73_m2} (ref >59)
GFR calc non Af Amer: 77 mL/min/{1.73_m2} (ref >59)
Glucose: 94 mg/dL (ref 65–99)
Potassium: 4.9 mmol/L (ref 3.5–5.2)
Sodium: 140 mmol/L (ref 134–144)

## 2018-11-02 ENCOUNTER — Encounter (HOSPITAL_COMMUNITY): Payer: Self-pay

## 2018-11-02 ENCOUNTER — Other Ambulatory Visit: Payer: Self-pay

## 2018-11-02 ENCOUNTER — Emergency Department (HOSPITAL_COMMUNITY)
Admission: EM | Admit: 2018-11-02 | Discharge: 2018-11-02 | Disposition: A | Discharge status: Home / Self Care | Payer: Self-pay | Attending: Emergency Medicine | Admitting: Emergency Medicine

## 2018-11-02 DIAGNOSIS — E119 Type 2 diabetes mellitus without complications: Secondary | ICD-10-CM | POA: Insufficient documentation

## 2018-11-02 DIAGNOSIS — H61001 Unspecified perichondritis of right external ear: Secondary | ICD-10-CM | POA: Insufficient documentation

## 2018-11-02 DIAGNOSIS — Z7984 Long term (current) use of oral hypoglycemic drugs: Secondary | ICD-10-CM | POA: Insufficient documentation

## 2018-11-02 DIAGNOSIS — I10 Essential (primary) hypertension: Secondary | ICD-10-CM | POA: Insufficient documentation

## 2018-11-02 DIAGNOSIS — Z79899 Other long term (current) drug therapy: Secondary | ICD-10-CM | POA: Insufficient documentation

## 2018-11-02 MED ORDER — HYDROCODONE-ACETAMINOPHEN 5-325 MG PO TABS
1.0000 | ORAL_TABLET | Freq: Once | ORAL | Status: AC
  Administered 2018-11-02: 1 via ORAL
  Filled 2018-11-02: qty 1

## 2018-11-02 MED ORDER — CIPROFLOXACIN HCL 500 MG PO TABS: 500.0000 mg | ORAL_TABLET | Freq: Two times a day (BID) | ORAL | 0 refills | Status: AC

## 2018-11-02 MED ORDER — CIPROFLOXACIN HCL 500 MG PO TABS
500.0000 mg | ORAL_TABLET | Freq: Once | ORAL | Status: AC
  Administered 2018-11-02: 500 mg via ORAL
  Filled 2018-11-02: qty 1

## 2018-11-02 MED ORDER — HYDROCODONE-ACETAMINOPHEN 5-325 MG PO TABS: 1.0000 | ORAL_TABLET | Freq: Four times a day (QID) | ORAL | 0 refills | Status: DC | PRN

## 2018-11-02 NOTE — Discharge Instructions (Signed)
Tome ciprofloxacina segn lo prescrito hasta completarla. Preste mucha atencin a su nivel de azcar en sangre, ya que su antibitico puede causar hipoglucemia. Para el dolor intenso, tome 1 Vicodin cada 6 horas segn sea necesario. No conduzca ni maneje maquinaria mientras est tomando [employer]. Si no observa Margaret Pace en 2 o 3 das, programe una cita lo antes posible con el Margaret Pace. De lo contrario, realice un seguimiento en 10 a 14 das. Regrese al departamento de emergencias si presenta un aumento del dolor, hinchazn, trismo o cualquier otro sntoma preocupante.  No beba alcohol, conduzca, opere maquinaria ni participe en ninguna otra actividad potencialmente peligrosa mientras est tomando analgsicos opiceos, ya que pueden causarle sueo. No tome este medicamento con ningn otro medicamento sedante, ya sea con receta o sin receta. Si le recetaron Percocet o Vicodin, no los tome con acetaminofn (Tylenol), ya que estos medicamentos ya estn contenidos y la sobredosis de Tylenol es peligrosa.  Take ciprofloxacin as prescribed until completed.  For severe pain, take 1 Vicodin every 6 hours as needed.  Do not drive or operate machinery while taking this medication.  If you are not seeing improvement in 2 to 3 days, please make an appointment as soon as possible with Margaret Pace.  Otherwise, please follow-up in 10 to 14 days.  Please return to the emergency department if you develop increasing pain, swelling, lockjaw, or any other concerning symptoms.  Do not drink alcohol, drive, operate machinery or participate in any other potentially dangerous activities while taking opiate pain medication as it may make you sleepy. Do not take this medication with any other sedating medications, either prescription or over-the-counter. If you were prescribed Percocet or Vicodin, do not take these with acetaminophen (Tylenol) as it is already contained within these medications and overdose of Tylenol is  dangerous.   This medication is an opiate (or narcotic) pain medication and can be habit forming.  Use it as little as possible to achieve adequate pain control.  Do not use or use it with extreme caution if you have a history of opiate abuse or dependence. This medication is intended for your use only - do not give any to anyone else and keep it in a secure place where nobody else, especially children, have access to it. It will also cause or worsen constipation, so you may want to consider taking an over-the-counter stool softener while you are taking this medication.
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
## 2018-11-02 NOTE — ED Triage Notes (Signed)
Per PTAR, Pt called to be transported to ED for right ear pain. PTAR tried to get pt to do a tele visit with her primary care but Pt refused and requested to be brought here. Pt reports right ear swollen and red x2 days. Pt reports 10/10 pain and itching in the right ear. Pt reports hives/lumps behind right ear. Pt reports having a fever yesterday but is afebrile today. Pt denies any drainage.

## 2018-11-10 ENCOUNTER — Telehealth: Payer: Self-pay

## 2018-11-10 NOTE — Telephone Encounter (Signed)
Patient name and DOB has been verified Patient was informed of lab results. Patient had no questions.  

## 2018-11-10 NOTE — Telephone Encounter (Signed)
-----   Message from Charlott Rakes, MD sent at 10/28/2018 12:34 PM EDT ----- Please inform the patient that labs are normal. Thank you.

## 2018-12-06 ENCOUNTER — Other Ambulatory Visit: Payer: Self-pay | Admitting: Family Medicine

## 2018-12-06 DIAGNOSIS — M778 Other enthesopathies, not elsewhere classified: Secondary | ICD-10-CM

## 2019-02-21 ENCOUNTER — Other Ambulatory Visit: Payer: Self-pay

## 2019-02-21 ENCOUNTER — Ambulatory Visit: Payer: Self-pay | Attending: Family Medicine | Admitting: Family Medicine

## 2019-02-21 DIAGNOSIS — M5489 Other dorsalgia: Secondary | ICD-10-CM

## 2019-02-21 DIAGNOSIS — Z1231 Encounter for screening mammogram for malignant neoplasm of breast: Secondary | ICD-10-CM

## 2019-02-21 DIAGNOSIS — E11649 Type 2 diabetes mellitus with hypoglycemia without coma: Secondary | ICD-10-CM

## 2019-02-21 DIAGNOSIS — M549 Dorsalgia, unspecified: Secondary | ICD-10-CM

## 2019-02-21 MED ORDER — METHOCARBAMOL 500 MG PO TABS: 500.0000 mg | ORAL_TABLET | Freq: Three times a day (TID) | ORAL | 1 refills | Status: DC | PRN

## 2019-02-21 NOTE — Progress Notes (Signed)
Patient has been called and DOB has been verified. Patient has been screened and transferred to PCP to start phone visit.     

## 2019-02-21 NOTE — Progress Notes (Signed)
Virtual Visit via Telephone Note  I connected with Margaret Pace, on 02/21/2019 at 9:28 AM by telephone due to the COVID-19 pandemic and verified that I am speaking with the correct person using two identifiers.   Consent: I discussed the limitations, risks, security and privacy concerns of performing an evaluation and management service by telephone and the availability of in person appointments. I also discussed with the patient that there may be a patient responsible charge related to this service. The patient expressed understanding and agreed to proceed.   Location of Patient: Home  Location of Provider: Clinic   Persons participating in Telemedicine visit: Margaret Pace Dr. Margarita Pace ID #[id number] Margaret Pace (Interpreter)    History of Present Illness: Margaret Pace is a 54 year old female with a history of type 2 diabetes mellitus (A1c 8.3) who presents today for follow-up visit.   She complains of back pain for the last 3 days and endorses heavy lifting. Pain is in her mid back and is described as moderate and does not radiate.Denies numbness or tingling in legs or loss of bladder or bowel function.  Blood sugars have been 112-130 after breakfast; she denies hypoglycemia symptoms but has had a blood sugar of 50. Denies presence of visual symptoms.  Past Medical History:  Diagnosis Date  . Anemia    Pt reported blood transfusion after left leg fracture.  . Carpal tunnel syndrome, bilateral   . Diabetes mellitus without complication ([place])    No Known Allergies  Current Outpatient Medications on File Prior to Visit  Medication Sig Dispense Refill  . atorvastatin (LIPITOR) 40 MG tablet Take 1 tablet (40 mg total) by mouth daily. 30 tablet 6  . Blood Glucose Monitoring Suppl (TRUE METRIX METER) DEVI 1 each by Does not apply route 3 (three) times daily before meals. 1 Device 0  . canagliflozin (INVOKANA) 100 MG TABS tablet Take 1 tablet (100 mg  total) by mouth daily before breakfast. 30 tablet 6  . glipiZIDE (GLUCOTROL) 10 MG tablet Take 1 tablet (10 mg total) by mouth 2 (two) times daily before a meal. 60 tablet 6  . glucose blood test strip USE AS DIRECTED THREE TIMES DAILY 100 each 12  . HYDROcodone-acetaminophen (NORCO/VICODIN) 5-325 MG tablet Take 1 tablet by mouth every 6 (six) hours as needed for severe pain. 15 tablet 0  . lisinopril (ZESTRIL) 2.5 MG tablet Take 1 tablet (2.5 mg total) by mouth daily. 30 tablet 6  . metFORMIN (GLUCOPHAGE) 1000 MG tablet Take 1 tablet (1,000 mg total) by mouth 2 (two) times daily with a meal. 60 tablet 6  . naproxen (NAPROSYN) 500 MG tablet TAKE 1 TABLET (500 MG TOTAL) BY MOUTH 2 (TWO) TIMES DAILY WITH A MEAL. 60 tablet 0  . predniSONE (DELTASONE) 20 MG tablet Take 1 tablet (20 mg total) by mouth daily with breakfast. 5 tablet 0  . TRUEPLUS LANCETS 28G MISC 1 each by Does not apply route 3 (three) times daily before meals. 100 each 12  . polyethylene glycol-electrolytes (TRILYTE) 420 g solution Take 4,000 mLs by mouth as directed. (Patient not taking: Reported on 01/28/2017) 4000 mL 0  . valACYclovir (VALTREX) 1000 MG tablet Take 1 tablet (1,000 mg total) by mouth 3 (three) times daily. (Patient not taking: Reported on 10/26/2018) 21 tablet 0   No current facility-administered medications on file prior to visit.    Observations/Objective: Alert, awake, oriented x3 Not in acute distress  CMP Latest Ref Rng & Units 10/26/2018  06/23/2018 12/20/2017  Glucose 65 - 99 mg/dL 94 77 161(W)  BUN 6 - 24 mg/dL 96(E) 20 45(W)  Creatinine 0.57 - 1.00 mg/dL 0.98 1.19 1.47  Sodium 134 - 144 mmol/L 140 141 134(L)  Potassium 3.5 - 5.2 mmol/L 4.9 4.7 3.9  Chloride 96 - 106 mmol/L 105 105 103  CO2 20 - 29 mmol/L 24 21 21(L)  Calcium 8.7 - 10.2 mg/dL 9.9 9.9 9.6  Total Protein 6.0 - 8.5 g/dL - 7.0 7.8  Total Bilirubin 0.0 - 1.2 mg/dL - 0.3 0.8  Alkaline Phos 39 - 117 IU/L - 127(H) 108  AST 0 - 40 IU/L - 17 22   ALT 0 - 32 IU/L - 14 13    Lipid Panel     Component Value Date/Time   CHOL 90 (L) 06/23/2018 0910   TRIG 85 06/23/2018 0910   HDL 42 06/23/2018 0910   CHOLHDL 2.1 06/23/2018 0910   CHOLHDL 3.3 04/14/2016 0928   VLDL 45 (H) 09/25/2015 1144   LDLCALC 31 06/23/2018 0910   LABVLDL 17 06/23/2018 0910    Lab Results  Component Value Date   HGBA1C 8.3 (A) 10/26/2018    Assess56ment and Plan: 1. Type 2 diabetes mellitus with hypoglycemia without coma, without long-term current use of insulin (HCC) Previously not at goal with A1c of 8.3; goal is less than 7 Current blood sugars are at goal and she has had one hypoglycemic episode Educated on management of hypoglycemia and she knows to inform the clinic if this occurs A1c at next in person visit Counseled on Diabetic diet, my plate method, 829 minutes of moderate intensity exercise/week Blood sugar logs with fasting goals of 80-120 mg/dl, random of less than 562 and in the event of sugars less than 60 mg/dl or greater than 130 mg/dl encouraged to notify the clinic. Advised on the need for annual eye exams, annual foot exams, Pneumonia vaccine.  2. Musculoskeletal back pain Due to recent heavy lifting Advised to apply heat Discussed sedating side effects of muscle relaxant - methocarbamol (ROBAXIN) 500 MG tablet; Take 1 tablet (500 mg total) by mouth every 8 (eight) hours as needed for muscle spasms.  Dispense: 60 tablet; Refill: 1  3. Encounter for screening mammogram for malignant neoplasm of breast - MM 3D SCREEN BREAST BILATERAL; Future   Follow Up Instructions: 3 months-in person   I discussed the assessment and treatment plan with the patient. The patient was provided an opportunity to ask questions and all were answered. The patient agreed with the plan and demonstrated an understanding of the instructions.   The patient was advised to call back or seek an in-person evaluation if the symptoms worsen or if the condition  fails to improve as anticipated.     I provided 16 minutes total of non-face-to-face time during this encounter including median intraservice time, reviewing previous notes, investigations, ordering medications, medical decision making, coordinating care and patient verbalized understanding at the end of the visit.     Hoy Register, MD, FAAFP. [facility] [place], [place] [phone]   02/21/2019, 9:28 AM

## 2019-04-10 ENCOUNTER — Other Ambulatory Visit: Payer: Self-pay | Admitting: Family Medicine

## 2019-04-10 DIAGNOSIS — E1165 Type 2 diabetes mellitus with hyperglycemia: Secondary | ICD-10-CM

## 2019-04-10 DIAGNOSIS — I10 Essential (primary) hypertension: Secondary | ICD-10-CM

## 2019-04-11 ENCOUNTER — Telehealth: Payer: Self-pay

## 2019-04-11 DIAGNOSIS — M778 Other enthesopathies, not elsewhere classified: Secondary | ICD-10-CM

## 2019-04-11 DIAGNOSIS — M549 Dorsalgia, unspecified: Secondary | ICD-10-CM

## 2019-04-11 NOTE — Telephone Encounter (Signed)
Atorvastatin Metformin  Glipizide Lisinopril naproxenrobaxin invokana  Sent to [facility]

## 2019-04-12 MED ORDER — METHOCARBAMOL 500 MG PO TABS: 500.0000 mg | ORAL_TABLET | Freq: Three times a day (TID) | ORAL | 1 refills | Status: DC | PRN

## 2019-04-12 MED ORDER — NAPROXEN 500 MG PO TABS: 500.0000 mg | ORAL_TABLET | Freq: Two times a day (BID) | ORAL | 1 refills | Status: DC

## 2019-04-12 NOTE — Telephone Encounter (Signed)
Done

## 2019-05-18 ENCOUNTER — Other Ambulatory Visit: Payer: Self-pay

## 2019-05-18 ENCOUNTER — Ambulatory Visit: Payer: Self-pay

## 2019-05-18 DIAGNOSIS — Z7984 Long term (current) use of oral hypoglycemic drugs: Secondary | ICD-10-CM | POA: Insufficient documentation

## 2019-05-18 DIAGNOSIS — T887XXA Unspecified adverse effect of drug or medicament, initial encounter: Secondary | ICD-10-CM | POA: Insufficient documentation

## 2019-05-18 DIAGNOSIS — R112 Nausea with vomiting, unspecified: Secondary | ICD-10-CM | POA: Insufficient documentation

## 2019-05-18 DIAGNOSIS — R519 Headache, unspecified: Secondary | ICD-10-CM | POA: Insufficient documentation

## 2019-05-18 DIAGNOSIS — E119 Type 2 diabetes mellitus without complications: Secondary | ICD-10-CM | POA: Insufficient documentation

## 2019-05-18 DIAGNOSIS — Z79899 Other long term (current) drug therapy: Secondary | ICD-10-CM | POA: Insufficient documentation

## 2019-05-18 DIAGNOSIS — T50Z95A Adverse effect of other vaccines and biological substances, initial encounter: Secondary | ICD-10-CM | POA: Insufficient documentation

## 2019-05-18 NOTE — ED Triage Notes (Signed)
Arrived by EMS from home. Patient received COVID vaccine yesterday and reports fever, nausea, and vomiting. Patient received 4 mg Zofran IV en route this facility. Temperature 98.4

## 2019-05-18 NOTE — ED Notes (Signed)
Pt daughter called under [phone] and requests information on this patient's care.

## 2019-05-19 ENCOUNTER — Other Ambulatory Visit: Payer: Self-pay

## 2019-05-19 ENCOUNTER — Emergency Department (HOSPITAL_COMMUNITY)
Admission: EM | Admit: 2019-05-19 | Discharge: 2019-05-19 | Disposition: A | Discharge status: Home / Self Care | Payer: Self-pay | Attending: Emergency Medicine | Admitting: Emergency Medicine

## 2019-05-19 DIAGNOSIS — I10 Essential (primary) hypertension: Secondary | ICD-10-CM

## 2019-05-19 DIAGNOSIS — T50Z95A Adverse effect of other vaccines and biological substances, initial encounter: Secondary | ICD-10-CM

## 2019-05-19 LAB — URINALYSIS, ROUTINE W REFLEX MICROSCOPIC
Bilirubin Urine: NEGATIVE
Glucose, UA: >=500 mg/dL — AB
Hgb urine dipstick: NEGATIVE
Ketones, ur: 20 mg/dL — AB
Leukocytes,Ua: NEGATIVE
Nitrite: NEGATIVE
Protein, ur: 100 mg/dL — AB
Specific Gravity, Urine: 1.026 (ref 1.005–1.030)
pH: 7 (ref 5.0–8.0)

## 2019-05-19 LAB — CBG MONITORING, ED: Glucose-Capillary: 275 mg/dL — ABNORMAL HIGH (ref 70–99)

## 2019-05-19 MED ORDER — SODIUM CHLORIDE 0.9 % IV BOLUS
1000.0000 mL | Freq: Once | INTRAVENOUS | Status: AC
  Administered 2019-05-19: 1000 mL via INTRAVENOUS

## 2019-05-19 MED ORDER — ONDANSETRON 4 MG PO TBDP
4.0000 mg | ORAL_TABLET | Freq: Once | ORAL | Status: AC
  Administered 2019-05-19: 05:00:00 4 mg via ORAL
  Filled 2019-05-19: qty 1

## 2019-05-19 MED ORDER — IBUPROFEN 800 MG PO TABS
800.0000 mg | ORAL_TABLET | Freq: Once | ORAL | Status: AC
  Administered 2019-05-19: 800 mg via ORAL
  Filled 2019-05-19: qty 1

## 2019-05-19 MED ORDER — ONDANSETRON 4 MG PO TBDP: ORAL_TABLET | ORAL | 0 refills | Status: DC

## 2019-05-19 NOTE — ED Notes (Signed)
Patient was given coke for PO intake of fluid.
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
## 2019-05-19 NOTE — Discharge Instructions (Addendum)
1. Medications: zofran for vomiting, usual home medications 2. Treatment: rest, drink plenty of fluids,  3. Follow Up: Please followup with your primary doctor in 2-3 days for discussion of your diagnoses, repeat blood pressure check and further evaluation after today's visit; Please return to the ER for persistent vomiting, high fevers, new or worsening symptoms.

## 2019-05-30 ENCOUNTER — Other Ambulatory Visit: Payer: Self-pay

## 2019-05-30 ENCOUNTER — Other Ambulatory Visit: Payer: Self-pay | Admitting: Family Medicine

## 2019-05-30 ENCOUNTER — Encounter: Payer: Self-pay | Admitting: Family Medicine

## 2019-05-30 ENCOUNTER — Ambulatory Visit: Payer: Self-pay | Attending: Family Medicine | Admitting: Family Medicine

## 2019-05-30 VITALS — BP 124/68 | HR 60 | Ht 62.0 in | Wt 156.6 lb

## 2019-05-30 DIAGNOSIS — E1165 Type 2 diabetes mellitus with hyperglycemia: Secondary | ICD-10-CM

## 2019-05-30 DIAGNOSIS — M549 Dorsalgia, unspecified: Secondary | ICD-10-CM

## 2019-05-30 DIAGNOSIS — I10 Essential (primary) hypertension: Secondary | ICD-10-CM

## 2019-05-30 MED ORDER — METFORMIN HCL 1000 MG PO TABS: 1000.0000 mg | ORAL_TABLET | Freq: Two times a day (BID) | ORAL | 6 refills | Status: DC

## 2019-05-30 MED ORDER — ATORVASTATIN CALCIUM 40 MG PO TABS: 40.0000 mg | ORAL_TABLET | Freq: Every day | ORAL | 6 refills | Status: DC

## 2019-05-30 MED ORDER — GLIPIZIDE 10 MG PO TABS: 10.0000 mg | ORAL_TABLET | Freq: Two times a day (BID) | ORAL | 6 refills | Status: DC

## 2019-05-30 MED ORDER — LISINOPRIL 2.5 MG PO TABS: 2.5000 mg | ORAL_TABLET | Freq: Every day | ORAL | 6 refills | Status: DC

## 2019-05-30 MED ORDER — LIDOCAINE 5 % EX PTCH: 1.0000 | MEDICATED_PATCH | CUTANEOUS | 1 refills | Status: DC

## 2019-05-30 MED ORDER — CANAGLIFLOZIN 100 MG PO TABS: ORAL_TABLET | ORAL | 6 refills | Status: DC

## 2019-05-30 NOTE — Progress Notes (Signed)
Subjective:  Patient ID: Margaret Pace, female    DOB: [birth year]  Age: 54 y.o. MRN: [id number]  CC: Diabetes   HPI Lakoda Raske is a 54 year old female with a history of type 2 diabetes mellitus (A1c8.3) who presents today for follow-up visit. 2 weeks ago she used a hair dye which burned her scalp and caused her to loose her hair. She cut her hair off 'to prevent fungus'. Burning has ceased and she is asymptomatic. She denies presence of nausea, vomiting, abdominal pain, constipation.  She had an ED visit for a vaccine reaction after she received her first dose of COVID -19 on 05/17/19 but states he feels better now. Blood sugars are 'a bit elevated' - 130, 135 prior to breakfast. She has not been taking Invokana and states the pharmacy never gave it to her. Methocarbamol for her back 'does not work'.  She has been using it every 8 hours as needed and would like something else. She has no additional concerns today.  Past Medical History:  Diagnosis Date  . Anemia    Pt reported blood transfusion after left leg fracture.  . Carpal tunnel syndrome, bilateral   . Diabetes mellitus without complication [facility])     Past Surgical History:  Procedure Laterality Date  . APPLICATION OF WOUND VAC Left 05/14/2014   Procedure: APPLICATION OF WOUND VAC;  Surgeon: Altamese Greentown, MD;  Location: [place];  Service: Orthopedics;  Laterality: Left;  . FASCIOTOMY Left 05/14/2014   Procedure: FOUR COMPARTMENT FASCIOTOMY;  Surgeon: Altamese Geneva, MD;  Location: [place];  Service: Orthopedics;  Laterality: Left;  . FRACTURE SURGERY    . HARDWARE REMOVAL Left 10/19/2014   Procedure: HARDWARE REMOVAL LEFT TIBIA AND ANKLE;  Surgeon: Altamese Elmira, MD;  Location: [place];  Service: Orthopedics;  Laterality: Left;  . SECONDARY CLOSURE OF WOUND Left 05/17/2014   Procedure: WOUND CLOSURE LEFT LEG ;  Surgeon: Altamese Goodman, MD;  Location: [place];  Service: Orthopedics;  Laterality: Left;  . TIBIA IM NAIL INSERTION Left  05/14/2014   Procedure: INTRAMEDULLARY (IM) NAIL TIBIAL;  Surgeon: Altamese Sycamore, MD;  Location: [place];  Service: Orthopedics;  Laterality: Left;    Family History  Problem Relation Age of Onset  . Diabetes Mother   . Diabetes Sister   . Diabetes Brother   . Diabetes Sister   . Diabetes Sister   . Diabetes Brother     No Known Allergies  Outpatient Medications Prior to Visit  Medication Sig Dispense Refill  . atorvastatin (LIPITOR) 40 MG tablet TAKE 1 TABLET (40 MG TOTAL) BY MOUTH DAILY. 30 tablet 2  . Blood Glucose Monitoring Suppl (TRUE METRIX METER) DEVI 1 each by Does not apply route 3 (three) times daily before meals. 1 Device 0  . glipiZIDE (GLUCOTROL) 10 MG tablet Take 1 tablet (10 mg total) by mouth 2 (two) times daily before a meal. 60 tablet 6  . glucose blood test strip USE AS DIRECTED THREE TIMES DAILY 100 each 12  . lisinopril (ZESTRIL) 2.5 MG tablet TAKE 1 TABLET (2.5 MG TOTAL) BY MOUTH DAILY. 30 tablet 2  . metFORMIN (GLUCOPHAGE) 1000 MG tablet TAKE 1 TABLET (1,000 MG TOTAL) BY MOUTH 2 (TWO) TIMES DAILY WITH A MEAL. 60 tablet 2  . methocarbamol (ROBAXIN) 500 MG tablet Take 1 tablet (500 mg total) by mouth every 8 (eight) hours as needed for muscle spasms. 60 tablet 1  . TRUEPLUS LANCETS 28G MISC 1 each by Does not apply route 3 (  three) times daily before meals. 100 each 12  . HYDROcodone-acetaminophen (NORCO/VICODIN) 5-325 MG tablet Take 1 tablet by mouth every 6 (six) hours as needed for severe pain. (Patient not taking: Reported on 05/19/2019) 15 tablet 0  . INVOKANA 100 MG TABS tablet TAKE 1 TABLET (100 MG TOTAL) BY MOUTH DAILY BEFORE BREAKFAST. (Patient not taking: No sig reported) 30 tablet 2  . naproxen (NAPROSYN) 500 MG tablet Take 1 tablet (500 mg total) by mouth 2 (two) times daily with a meal. (Patient not taking: Reported on 05/30/2019) 60 tablet 1  . ondansetron (ZOFRAN ODT) 4 MG disintegrating tablet 26m ODT q4 hours prn nausea/vomit (Patient not taking: Reported  on 05/30/2019) 10 tablet 0  . polyethylene glycol-electrolytes (TRILYTE) 420 g solution Take 4,000 mLs by mouth as directed. (Patient not taking: Reported on 01/28/2017) 4000 mL 0  . predniSONE (DELTASONE) 20 MG tablet Take 1 tablet (20 mg total) by mouth daily with breakfast. (Patient not taking: Reported on 05/19/2019) 5 tablet 0  . valACYclovir (VALTREX) 1000 MG tablet Take 1 tablet (1,000 mg total) by mouth 3 (three) times daily. (Patient not taking: Reported on 10/26/2018) 21 tablet 0   No facility-administered medications prior to visit.     ROS Review of Systems  Constitutional: Negative for activity change, appetite change and fatigue.  HENT: Negative for congestion, sinus pressure and sore throat.   Eyes: Negative for visual disturbance.  Respiratory: Negative for cough, chest tightness, shortness of breath and wheezing.   Cardiovascular: Negative for chest pain and palpitations.  Gastrointestinal: Negative for abdominal distention, abdominal pain and constipation.  Endocrine: Negative for polydipsia.  Genitourinary: Negative for dysuria and frequency.  Musculoskeletal: Negative for arthralgias and back pain.  Skin: Negative for rash.  Neurological: Negative for tremors, light-headedness and numbness.  Hematological: Does not bruise/bleed easily.  Psychiatric/Behavioral: Negative for agitation and behavioral problems.    Objective:  BP 124/68   Pulse 60   Ht '5\' 2"'  (1.575 m)   Wt 156 lb 9.6 oz (71 kg)   LMP 11/12/2013   SpO2 100%   BMI 28.64 kg/m   BP/Weight 05/30/2019 05/19/2019 98/52/7782 Systolic BP [id number]  Diastolic BP 68 66 61  Wt. (Lbs) 156.6 154.1 -  BMI 28.64 28.19 -      Physical Exam Constitutional:      Appearance: She is well-developed.  Neck:     Vascular: No JVD.  Cardiovascular:     Rate and Rhythm: Normal rate.     Heart sounds: Normal heart sounds. No murmur.  Pulmonary:     Effort: Pulmonary effort is normal.     Breath sounds: Normal  breath sounds. No wheezing or rales.  Chest:     Chest wall: No tenderness.  Abdominal:     General: Bowel sounds are normal. There is no distension.     Palpations: Abdomen is soft. There is no mass.     Tenderness: There is no abdominal tenderness.  Musculoskeletal:        General: Normal range of motion.     Right lower leg: No edema.     Left lower leg: No edema.  Neurological:     Mental Status: She is alert and oriented to person, place, and time.  Psychiatric:        Mood and Affect: Mood normal.     CMP Latest Ref Rng & Units 10/26/2018 06/23/2018 12/20/2017  Glucose 65 - 99 mg/dL 94 77 124(H)  BUN 6 -  24 mg/dL 28(H) 20 24(H)  Creatinine 0.57 - 1.00 mg/dL 0.87 0.74 0.96  Sodium 134 - 144 mmol/L 140 141 134(L)  Potassium 3.5 - 5.2 mmol/L 4.9 4.7 3.9  Chloride 96 - 106 mmol/L 105 105 103  CO2 20 - 29 mmol/L 24 21 21(L)  Calcium 8.7 - 10.2 mg/dL 9.9 9.9 9.6  Total Protein 6.0 - 8.5 g/dL - 7.0 7.8  Total Bilirubin 0.0 - 1.2 mg/dL - 0.3 0.8  Alkaline Phos 39 - 117 IU/L - 127(H) 108  AST 0 - 40 IU/L - 17 22  ALT 0 - 32 IU/L - 14 13    Lipid Panel     Component Value Date/Time   CHOL 90 (L) 06/23/2018 0910   TRIG 85 06/23/2018 0910   HDL 42 06/23/2018 0910   CHOLHDL 2.1 06/23/2018 0910   CHOLHDL 3.3 04/14/2016 0928   VLDL 45 (H) 09/25/2015 1144   LDLCALC 31 06/23/2018 0910    CBC    Component Value Date/Time   WBC 9.5 12/20/2017 1403   RBC 4.38 12/20/2017 1403   HGB 12.6 12/20/2017 1403   HCT 40.3 12/20/2017 1403   PLT 212 12/20/2017 1403   MCV 92.0 12/20/2017 1403   MCH 28.8 12/20/2017 1403   MCHC 31.3 12/20/2017 1403   RDW 14.2 12/20/2017 1403   LYMPHSABS 0.6 (L) 12/20/2017 1403   MONOABS 0.6 12/20/2017 1403   EOSABS 0.0 12/20/2017 1403   BASOSABS 0.0 12/20/2017 1403    Lab Results  Component Value Date   HGBA1C 8.3 (A) 10/26/2018    Assessment & Plan:  1. Type 2 diabetes mellitus with hyperglycemia, without long-term current use of insulin  (HCC) Uncontrolled with A1c of 8.3 We will send off A1c today  If elevated will hold off on adjusting regimen as she has not been taking her Invokana which I have refilled and advised her to resume. - Hemoglobin A1c - CMP14+EGFR - Lipid panel - glipiZIDE (GLUCOTROL) 10 MG tablet; Take 1 tablet (10 mg total) by mouth 2 (two) times daily before a meal.  Dispense: 60 tablet; Refill: 6 - atorvastatin (LIPITOR) 40 MG tablet; Take 1 tablet (40 mg total) by mouth daily.  Dispense: 30 tablet; Refill: 6 - metFORMIN (GLUCOPHAGE) 1000 MG tablet; Take 1 tablet (1,000 mg total) by mouth 2 (two) times daily with a meal.  Dispense: 60 tablet; Refill: 6 - canagliflozin (INVOKANA) 100 MG TABS tablet; TAKE 1 TABLET (100 MG TOTAL) BY MOUTH DAILY BEFORE BREAKFAST.  Dispense: 30 tablet; Refill: 6  2. Essential hypertension Controlled Counseled on blood pressure goal of less than 130/80, low-sodium, DASH diet, medication compliance, 150 minutes of moderate intensity exercise per week. Discussed medication compliance, adverse effects. - lisinopril (ZESTRIL) 2.5 MG tablet; Take 1 tablet (2.5 mg total) by mouth daily.  Dispense: 30 tablet; Refill: 6  3. Musculoskeletal back pain Uncontrolled We will add lidocaine patch - lidocaine (LIDODERM) 5 %; Place 1 patch onto the skin daily. Remove & Discard patch within 12 hours or as directed by MD  Dispense: 30 patch; Refill: 1    Healthcare maintenance - she is due for an eye exam; advised to schedule appointment for annual eye exams at [facility] given she has no medical coverage for ophthalmology referral  Charlott Rakes, MD, FAAFP. [facility] [place], [place]   05/30/2019, 10:13 AM

## 2019-05-30 NOTE — Patient Instructions (Addendum)
Diabetes mellitus y actividad fsica Diabetes Mellitus and Exercise Hacer actividad fsica habitualmente es importante para el estado de salud general, en especial si tiene diabetes (diabetes mellitus). La actividad fsica no solo se reduce a bajar de peso. Aporta muchos beneficios para la salud, como aumento de la fuerza muscular y la densidad sea, y reduccin de las grasas corporales y el estrs. Esto mejora el estado fsico, la flexibilidad y la resistencia, y todo ello redunda en un mejor estado de salud general. La actividad fsica tiene beneficios adicionales para los diabticos, entre ellos:  Disminuye el apetito.  Ayuda a bajar y mantener la glucemia bajo control.  Baja la presin arterial.  Ayuda a controlar las cantidades de sustancias grasas (lpidos) en la sangre, como el colesterol y los triglicridos.  Mejora la respuesta del cuerpo a la insulina (optimizacin de la sensibilidad a la insulina).  Reduce la cantidad de insulina que el cuerpo necesita.  Reduce el riesgo de sufrir cardiopata coronaria de la siguiente forma: ? Baja los niveles de colesterol y triglicridos. ? Aumenta los niveles de colesterol bueno. ? Disminuye la glucemia. Cul es mi plan de actividad? El mdico o un educador para la diabetes certificado pueden ayudarlo a elaborar un plan respecto del tipo y de la frecuencia de actividad fsica (plan de actividades) adecuado para usted. Asegrese de lo siguiente:  Haga por lo menos 150minutos semanales de ejercicios de intensidad moderada o vigorosa. Estos podran ser caminatas dinmicas, ciclismo o gimnasia acutica. ? Haga ejercicios de elongacin y de fortalecimiento, como yoga o levantamiento de pesas, por lo menos 2veces por semana. ? Reparta la actividad en al menos 3das de la semana.  Haga algn tipo de actividad fsica todos los das. ? No deje pasar ms de 2das seguidos sin hacer algn tipo de actividad fsica. ? Evite permanecer inactivo  durante ms de 30minutos seguidos. Tmese descansos frecuentes para caminar o estirarse.  Elija un tipo de ejercicio o de actividad que disfrute y establezca objetivos realistas.  Comience lentamente y aumente de manera gradual la intensidad del ejercicio con el correr del tiempo. Qu debo saber acerca del control de la diabetes?   Contrlese la glucemia antes y despus de ejercitarse. ? Si la glucemia es de 240mg/dl (13,3mmol/l) o ms antes de comenzar a hacer actividad fsica, controle la orina para detectar la presencia de cetonas. Si tiene cetonas en la orina, no haga ejercicio hasta que la glucemia se normalice. ? Si la glucemia es de 100 mg/dl (5.6 mmol/l) o menos, tome una colacin que contenga entre 15 y 20 gramos de carbohidratos. Controle la glucemia 15 minutos despus de la colacin para asegurarse de que el nivel est por encima de 100 mg/dl (5.6 mmol/l) antes de comenzar a hacer actividad fsica.  Conozca los sntomas de la glucemia baja (hipoglucemia) y aprenda cmo tratarla. El riesgo de tener hipoglucemia aumenta durante y despus de hacer actividad fsica. Los sntomas frecuentes de hipoglucemia pueden incluir los siguientes: ? Hambre. ? Ansiedad. ? Sudoracin y piel hmeda. ? Confusin. ? Mareos o sensacin de desvanecimiento. ? Aumento de la frecuencia cardaca o palpitaciones. ? Visin borrosa. ? Hormigueo o adormecimiento alrededor de la boca, los labios o la lengua. ? Estremecimientos y temblores. ? Irritabilidad.  Tenga una colacin de carbohidratos de accin rpida disponible antes, durante y despus de ejercitarse, a fin de evitar o tratar la hipoglucemia.  Evite inyectarse insulina en las zonas del cuerpo que ejercitar. Por ejemplo, evite inyectarse insulina en: ? Los brazos,   si juega al tenis. ? Las piernas, si corre.  Lleve registros de sus hbitos de actividad fsica. Esto puede ayudarlos a usted y al mdico a adaptar el plan de control de la diabetes  segn sea necesario. Escriba los siguientes datos: ? Los alimentos que consume antes y despus de hacer actividad fsica. ? Los niveles de glucosa en la sangre antes y despus de hacer ejericios. ? El tipo y cantidad de actividad fsica que realiza. ? Cuando se prev que la insulina alcance su valor mximo, si usa insulina. No haga actividad fsica en los momentos en que insulina alcanza su valor mximo.  Cuando comience un ejercicio o una actividad nuevos, trabaje con el mdico para asegurarse de que la actividad sea segura para usted y para ajustar la insulina, los medicamentos o la ingesta de alimentos segn sea necesario.  Beba gran cantidad de agua mientras hace ejercicio para evitar la deshidratacin o los golpes de calor. Beba suficiente lquido como para mantener la orina clara o de color amarillo plido. Resumen  Hacer actividad fsica habitualmente es importante para el estado de salud general, en especial si tiene diabetes (diabetes mellitus).  La actividad fsica aporta muchos beneficios para la salud, como aumentar la fuerza muscular y la densidad sea, y reducir las grasas corporales y el estrs.  El mdico o un educador para la diabetes certificado pueden ayudarlo a elaborar un plan respecto del tipo y de la frecuencia de actividad fsica (plan de actividades) adecuado para usted.  Cuando comience un ejercicio o una actividad nuevos, trabaje con el mdico para asegurarse de que la actividad sea segura para usted y para ajustar la insulina, los medicamentos o la ingesta de alimentos segn sea necesario. Esta informacin no tiene como fin reemplazar el consejo del mdico. Asegrese de hacerle al mdico cualquier pregunta que tenga. Document Revised: 11/23/2016 Document Reviewed: 07/08/2015 Elsevier Patient Education  2020 Elsevier Inc.  

## 2019-05-31 LAB — CMP14+EGFR
ALT: 10 IU/L (ref 0–32)
AST: 14 IU/L (ref 0–40)
Albumin/Globulin Ratio: 1.6 (ref 1.2–2.2)
Albumin: 4.2 g/dL (ref 3.8–4.9)
Alkaline Phosphatase: 140 IU/L — ABNORMAL HIGH (ref 39–117)
BUN/Creatinine Ratio: 16 (ref 9–23)
BUN: 13 mg/dL (ref 6–24)
Bilirubin Total: 0.4 mg/dL (ref 0.0–1.2)
CO2: 21 mmol/L (ref 20–29)
Calcium: 9.8 mg/dL (ref 8.7–10.2)
Chloride: 104 mmol/L (ref 96–106)
Creatinine, Ser: 0.82 mg/dL (ref 0.57–1.00)
GFR calc Af Amer: 94 mL/min/{1.73_m2} (ref >59)
GFR calc non Af Amer: 82 mL/min/{1.73_m2} (ref >59)
Globulin, Total: 2.6 g/dL (ref 1.5–4.5)
Glucose: 158 mg/dL — ABNORMAL HIGH (ref 65–99)
Potassium: 4.6 mmol/L (ref 3.5–5.2)
Sodium: 141 mmol/L (ref 134–144)
Total Protein: 6.8 g/dL (ref 6.0–8.5)

## 2019-05-31 LAB — LIPID PANEL
Chol/HDL Ratio: 2.5 ratio (ref 0.0–4.4)
Cholesterol, Total: 98 mg/dL — ABNORMAL LOW (ref 100–199)
HDL: 39 mg/dL — ABNORMAL LOW (ref >39)
LDL Chol Calc (NIH): 40 mg/dL (ref 0–99)
Triglycerides: 96 mg/dL (ref 0–149)
VLDL Cholesterol Cal: 19 mg/dL (ref 5–40)

## 2019-05-31 LAB — HEMOGLOBIN A1C
Est. average glucose Bld gHb Est-mCnc: 183 mg/dL
Hgb A1c MFr Bld: 8 % — ABNORMAL HIGH (ref 4.8–5.6)

## 2019-06-21 ENCOUNTER — Other Ambulatory Visit: Payer: Self-pay | Admitting: Family Medicine

## 2019-06-21 DIAGNOSIS — M778 Other enthesopathies, not elsewhere classified: Secondary | ICD-10-CM

## 2019-07-29 ENCOUNTER — Encounter (HOSPITAL_COMMUNITY): Payer: Self-pay

## 2019-07-29 ENCOUNTER — Emergency Department (HOSPITAL_COMMUNITY)
Admission: EM | Admit: 2019-07-29 | Discharge: 2019-07-30 | Disposition: A | Discharge status: Home / Self Care | Payer: Self-pay | Attending: Emergency Medicine | Admitting: Emergency Medicine

## 2019-07-29 DIAGNOSIS — R05 Cough: Secondary | ICD-10-CM | POA: Insufficient documentation

## 2019-07-29 DIAGNOSIS — Z5321 Procedure and treatment not carried out due to patient leaving prior to being seen by health care provider: Secondary | ICD-10-CM | POA: Insufficient documentation

## 2019-07-29 NOTE — ED Triage Notes (Signed)
To triage via EMS.  Information via video interpreter.  Onset 2 days persistent cough.  Took Nyquil today @ 11am with no relief.  Has had Covid vaccine.   EMS BP 136/76 HR 80 RR 16 Temp 96.8 SpO2 100% CBG 183

## 2019-07-30 NOTE — ED Notes (Signed)
Pt called 3 times no response  

## 2019-08-29 ENCOUNTER — Other Ambulatory Visit: Payer: Self-pay | Admitting: Family Medicine

## 2019-08-29 ENCOUNTER — Ambulatory Visit: Payer: Self-pay | Attending: Family Medicine | Admitting: Family Medicine

## 2019-08-29 ENCOUNTER — Encounter: Payer: Self-pay | Admitting: Family Medicine

## 2019-08-29 ENCOUNTER — Other Ambulatory Visit: Payer: Self-pay

## 2019-08-29 VITALS — BP 123/66 | HR 54 | Ht 62.0 in | Wt 155.0 lb

## 2019-08-29 DIAGNOSIS — E1165 Type 2 diabetes mellitus with hyperglycemia: Secondary | ICD-10-CM

## 2019-08-29 DIAGNOSIS — Z636 Dependent relative needing care at home: Secondary | ICD-10-CM

## 2019-08-29 DIAGNOSIS — M778 Other enthesopathies, not elsewhere classified: Secondary | ICD-10-CM

## 2019-08-29 DIAGNOSIS — Z1159 Encounter for screening for other viral diseases: Secondary | ICD-10-CM

## 2019-08-29 LAB — POCT GLYCOSYLATED HEMOGLOBIN (HGB A1C): HbA1c, POC (controlled diabetic range): 7.3 % — AB (ref 0.0–7.0)

## 2019-08-29 LAB — GLUCOSE, POCT (MANUAL RESULT ENTRY): POC Glucose: 92 mg/dl (ref 70–99)

## 2019-08-29 MED ORDER — INSULIN PEN NEEDLE 31G X 5 MM MISC: 1.0000 | Freq: Every day | 1 refills | Status: AC

## 2019-08-29 MED ORDER — TRULICITY 0.75 MG/0.5ML ~~LOC~~ SOAJ
0.7500 mg | SUBCUTANEOUS | 6 refills | Status: DC
Start: 2019-08-29 — End: 2020-09-23

## 2019-08-29 NOTE — Telephone Encounter (Signed)
Requested medication (s) are due for refill today: yes  Requested medication (s) are on the active medication list: yes  Last refill: 06/23/19  #60- 1 refill  Future visit scheduled:Today  Notes to clinic:  HGB last checked 12/20/17    Requested Prescriptions  Pending Prescriptions Disp Refills   naproxen (NAPROSYN) 500 MG tablet [Pharmacy Med Name: NAPROXEN 500 MG TABLET 500 Tablet] 60 tablet 1    Sig: TAKE 1 TABLET (500 MG TOTAL) BY MOUTH 2 (TWO) TIMES DAILY WITH A MEAL.      Analgesics:  NSAIDS Failed - 08/29/2019  1:15 PM      Failed - HGB in normal range and within 360 days    Hemoglobin  Date Value Ref Range Status  12/20/2017 12.6 12.0 - 15.0 g/dL Final          Passed - Cr in normal range and within 360 days    Creat  Date Value Ref Range Status  04/14/2016 0.76 0.50 - 1.05 mg/dL Final    Comment:      For patients > or = 53 years of age: The upper reference limit for Creatinine is approximately 13% higher for people identified as African-American.      Creatinine, Ser  Date Value Ref Range Status  05/30/2019 0.82 0.57 - 1.00 mg/dL Final   Creatinine, Urine  Date Value Ref Range Status  04/14/2016 75 20 - 320 mg/dL Final          Passed - Patient is not pregnant      Passed - Valid encounter within last 12 months    Recent Outpatient Visits           Today Type 2 diabetes mellitus with hyperglycemia, without long-term current use of insulin (HCC)   [facility] [place], [place], MD   3 months ago Type 2 diabetes mellitus with hyperglycemia, without long-term current use of insulin (HCC)   [facility] Hoy Register, MD   6 months ago Musculoskeletal back pain   [facility] [place], [place]ill, MD   10 months ago Type 2 diabetes mellitus with hyperglycemia, without long-term current use of insulin (HCC)   [facility] [place], Odette Horns, MD    1 year ago Situational depression   [facility] Hoy Register, MD       Future Appointments             In 3 months Hoy Register, MD [facility]

## 2019-08-29 NOTE — Progress Notes (Signed)
Subjective:  Patient ID: Margaret Pace, female    DOB: 11-15-1965  Age: 54 y.o. MRN: [id number]  CC: Diabetes   HPI Margaret Pace is a 54 year old female with a history of type 2 diabetes mellitus (A1c7.3) who presents today for follow-up visit.  She is concerned about her daughter who is wheelchair bound, chronically ill on oxygen and is hospitalized a lot. She denies being depressed and declines pharmacological intervention or psychotherapy. States she has support from her family, friends and church.  She endorses compliance with Metformin, Glipizide, Invokana and endorses compliance with a Diabetic diet.  Denies presence of neuropathy, visual concerns or hypoglycemic symptoms.  Doing well on lisinopril and also on her statin Past Medical History:  Diagnosis Date  . Anemia    Pt reported blood transfusion after left leg fracture.  . Carpal tunnel syndrome, bilateral   . Diabetes mellitus without complication [facility])     Past Surgical History:  Procedure Laterality Date  . APPLICATION OF WOUND VAC Left 05/14/2014   Procedure: APPLICATION OF WOUND VAC;  Surgeon: Altamese Pinckney, MD;  Location: [place];  Service: Orthopedics;  Laterality: Left;  . FASCIOTOMY Left 05/14/2014   Procedure: FOUR COMPARTMENT FASCIOTOMY;  Surgeon: Altamese Stewart, MD;  Location: [place];  Service: Orthopedics;  Laterality: Left;  . FRACTURE SURGERY    . HARDWARE REMOVAL Left 10/19/2014   Procedure: HARDWARE REMOVAL LEFT TIBIA AND ANKLE;  Surgeon: Altamese Karnak, MD;  Location: [place];  Service: Orthopedics;  Laterality: Left;  . SECONDARY CLOSURE OF WOUND Left 05/17/2014   Procedure: WOUND CLOSURE LEFT LEG ;  Surgeon: Altamese Byron, MD;  Location: [place];  Service: Orthopedics;  Laterality: Left;  . TIBIA IM NAIL INSERTION Left 05/14/2014   Procedure: INTRAMEDULLARY (IM) NAIL TIBIAL;  Surgeon: Altamese , MD;  Location: [place];  Service: Orthopedics;  Laterality: Left;    Family History  Problem Relation  Age of Onset  . Diabetes Mother   . Diabetes Sister   . Diabetes Brother   . Diabetes Sister   . Diabetes Sister   . Diabetes Brother     No Known Allergies  Outpatient Medications Prior to Visit  Medication Sig Dispense Refill  . atorvastatin (LIPITOR) 40 MG tablet Take 1 tablet (40 mg total) by mouth daily. 30 tablet 6  . Blood Glucose Monitoring Suppl (TRUE METRIX METER) DEVI 1 each by Does not apply route 3 (three) times daily before meals. 1 Device 0  . canagliflozin (INVOKANA) 100 MG TABS tablet TAKE 1 TABLET (100 MG TOTAL) BY MOUTH DAILY BEFORE BREAKFAST. 30 tablet 6  . glipiZIDE (GLUCOTROL) 10 MG tablet Take 1 tablet (10 mg total) by mouth 2 (two) times daily before a meal. 60 tablet 6  . glucose blood test strip USE AS DIRECTED THREE TIMES DAILY 100 each 12  . lidocaine (LIDODERM) 5 % Place 1 patch onto the skin daily. Remove & Discard patch within 12 hours or as directed by MD 30 patch 1  . lisinopril (ZESTRIL) 2.5 MG tablet Take 1 tablet (2.5 mg total) by mouth daily. 30 tablet 6  . metFORMIN (GLUCOPHAGE) 1000 MG tablet Take 1 tablet (1,000 mg total) by mouth 2 (two) times daily with a meal. 60 tablet 6  . methocarbamol (ROBAXIN) 500 MG tablet Take 1 tablet (500 mg total) by mouth every 8 (eight) hours as needed for muscle spasms. 60 tablet 1  . naproxen (NAPROSYN) 500 MG tablet TAKE 1 TABLET (500 MG TOTAL) BY  MOUTH 2 (TWO) TIMES DAILY WITH A MEAL. 60 tablet 1  . TRUEPLUS LANCETS 28G MISC 1 each by Does not apply route 3 (three) times daily before meals. 100 each 12  . HYDROcodone-acetaminophen (NORCO/VICODIN) 5-325 MG tablet Take 1 tablet by mouth every 6 (six) hours as needed for severe pain. (Patient not taking: Reported on 05/19/2019) 15 tablet 0  . ondansetron (ZOFRAN ODT) 4 MG disintegrating tablet 56m ODT q4 hours prn nausea/vomit (Patient not taking: Reported on 05/30/2019) 10 tablet 0  . polyethylene glycol-electrolytes (TRILYTE) 420 g solution Take 4,000 mLs by mouth as  directed. (Patient not taking: Reported on 01/28/2017) 4000 mL 0  . predniSONE (DELTASONE) 20 MG tablet Take 1 tablet (20 mg total) by mouth daily with breakfast. (Patient not taking: Reported on 05/19/2019) 5 tablet 0  . valACYclovir (VALTREX) 1000 MG tablet Take 1 tablet (1,000 mg total) by mouth 3 (three) times daily. (Patient not taking: Reported on 10/26/2018) 21 tablet 0   No facility-administered medications prior to visit.     ROS Review of Systems  Constitutional: Negative for activity change, appetite change and fatigue.  HENT: Negative for congestion, sinus pressure and sore throat.   Eyes: Negative for visual disturbance.  Respiratory: Negative for cough, chest tightness, shortness of breath and wheezing.   Cardiovascular: Negative for chest pain and palpitations.  Gastrointestinal: Negative for abdominal distention, abdominal pain and constipation.  Endocrine: Negative for polydipsia.  Genitourinary: Negative for dysuria and frequency.  Musculoskeletal: Negative for arthralgias and back pain.  Skin: Negative for rash.  Neurological: Negative for tremors, light-headedness and numbness.  Hematological: Does not bruise/bleed easily.  Psychiatric/Behavioral: Positive for dysphoric mood. Negative for agitation and behavioral problems.    Objective:  BP 123/66   Pulse (!) 54   Ht '5\' 2"'  (1.575 m)   Wt 155 lb (70.3 kg)   LMP 11/12/2013   SpO2 100%   BMI 28.35 kg/m   BP/Weight 08/29/2019 07/29/2019 41/02/7508 Systolic BP [id number] Diastolic BP 66 66 68  Wt. (Lbs) 155 - 156.6  BMI 28.35 - 28.64      Physical Exam Constitutional:      Appearance: She is well-developed.  Neck:     Vascular: No JVD.  Cardiovascular:     Rate and Rhythm: Bradycardia present.     Heart sounds: Normal heart sounds. No murmur heard.   Pulmonary:     Effort: Pulmonary effort is normal.     Breath sounds: Normal breath sounds. No wheezing or rales.  Chest:     Chest wall: No  tenderness.  Abdominal:     General: Bowel sounds are normal. There is no distension.     Palpations: Abdomen is soft. There is no mass.     Tenderness: There is no abdominal tenderness.  Musculoskeletal:        General: Normal range of motion.     Right lower leg: No edema.     Left lower leg: No edema.  Neurological:     Mental Status: She is alert and oriented to person, place, and time.  Psychiatric:     Comments: Dysphoric mood     CMP Latest Ref Rng & Units 05/30/2019 10/26/2018 06/23/2018  Glucose 65 - 99 mg/dL 158(H) 94 77  BUN 6 - 24 mg/dL 13 28(H) 20  Creatinine 0.57 - 1.00 mg/dL 0.82 0.87 0.74  Sodium 134 - 144 mmol/L 141 140 141  Potassium 3.5 - 5.2 mmol/L 4.6 4.9 4.7  Chloride 96 - 106 mmol/L 104 105 105  CO2 20 - 29 mmol/L '21 24 21  ' Calcium 8.7 - 10.2 mg/dL 9.8 9.9 9.9  Total Protein 6.0 - 8.5 g/dL 6.8 - 7.0  Total Bilirubin 0.0 - 1.2 mg/dL 0.4 - 0.3  Alkaline Phos 39 - 117 IU/L 140(H) - 127(H)  AST 0 - 40 IU/L 14 - 17  ALT 0 - 32 IU/L 10 - 14    Lipid Panel     Component Value Date/Time   CHOL 98 (L) 05/30/2019 1051   TRIG 96 05/30/2019 1051   HDL 39 (L) 05/30/2019 1051   CHOLHDL 2.5 05/30/2019 1051   CHOLHDL 3.3 04/14/2016 0928   VLDL 45 (H) 09/25/2015 1144   LDLCALC 40 05/30/2019 1051    CBC    Component Value Date/Time   WBC 9.5 12/20/2017 1403   RBC 4.38 12/20/2017 1403   HGB 12.6 12/20/2017 1403   HCT 40.3 12/20/2017 1403   PLT 212 12/20/2017 1403   MCV 92.0 12/20/2017 1403   MCH 28.8 12/20/2017 1403   MCHC 31.3 12/20/2017 1403   RDW 14.2 12/20/2017 1403   LYMPHSABS 0.6 (L) 12/20/2017 1403   MONOABS 0.6 12/20/2017 1403   EOSABS 0.0 12/20/2017 1403   BASOSABS 0.0 12/20/2017 1403    Lab Results  Component Value Date   HGBA1C 7.3 (A) 08/29/2019    Assessment & Plan:   1. Type 2 diabetes mellitus with hyperglycemia, without long-term current use of insulin (HCC) Close to goal with A1c of 7.3; goal is less than 7.0 We will add on  Trulicity Clinical pharmacist called in to provide education Counseled on Diabetic diet, my plate method, 503 minutes of moderate intensity exercise/week Blood sugar logs with fasting goals of 80-120 mg/dl, random of less than 180 and in the event of sugars less than 60 mg/dl or greater than 400 mg/dl encouraged to notify the clinic. Advised on the need for annual eye exams, annual foot exams, Pneumonia vaccine. - POCT glucose (manual entry) - POCT glycosylated hemoglobin (Hb A1C) - Microalbumin / creatinine urine ratio - CMP14+EGFR - LP+Non-HDL Cholesterol - Dulaglutide (TRULICITY) 5.46 FK/8.1EX SOPN; Inject 0.5 mLs (0.75 mg total) into the skin once a week.  Dispense: 1 pen; Refill: 6 - Insulin Pen Needle 31G X 5 MM MISC; 1 each by Does not apply route at bedtime.  Dispense: 100 each; Refill: 1  2. Need for hepatitis C screening test - HCV RNA quant rflx ultra or genotyp([facility])  3. 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 Dulaglutide (TRULICITY) 5.17 GY/1.7CB SOPN    Sig: Inject 0.5 mLs (0.75 mg total) into the skin once a week.    Dispense:  1 pen    Refill:  6  . Insulin Pen Needle 31G X 5 MM MISC    Sig: 1 each by Does not apply route at bedtime.    Dispense:  100 each    Refill:  1    Follow-up: Return in about 3 months (around 11/29/2019) for Chronic disease management.       Charlott Rakes, MD, FAAFP. [facility] and [place], [place]   08/29/2019, 12:36 PM

## 2019-08-29 NOTE — Patient Instructions (Signed)
Diabetes mellitus y nutricin, en adultos Diabetes Mellitus and Nutrition, Adult Si sufre de diabetes (diabetes mellitus), es muy importante tener hbitos alimenticios saludables debido a que sus niveles de azcar en la sangre (glucosa) se ven afectados en gran medida por lo que come y bebe. Comer alimentos saludables en las cantidades adecuadas, aproximadamente a la misma hora todos los das, lo ayudar a:  Controlar la glucemia.  Disminuir el riesgo de sufrir una enfermedad cardaca.  Mejorar la presin arterial.  Alcanzar o mantener un peso saludable. Todas las personas que sufren de diabetes son diferentes y cada una tiene necesidades diferentes en cuanto a un plan de alimentacin. El mdico puede recomendarle que trabaje con un especialista en dietas y nutricin (nutricionista) para elaborar el mejor plan para usted. Su plan de alimentacin puede variar segn factores como:  Las caloras que necesita.  Los medicamentos que toma.  Su peso.  Sus niveles de glucemia, presin arterial y colesterol.  Su nivel de actividad.  Otras afecciones que tenga, como enfermedades cardacas o renales. Cmo me afectan los carbohidratos? Los carbohidratos, o hidratos de carbono, afectan su nivel de glucemia ms que cualquier otro tipo de alimento. La ingesta de carbohidratos naturalmente aumenta la cantidad de glucosa en la sangre. El recuento de carbohidratos es un mtodo destinado a llevar un registro de la cantidad de carbohidratos que se consumen. El recuento de carbohidratos es importante para mantener la glucemia a un nivel saludable, especialmente si utiliza insulina o toma determinados medicamentos por va oral para la diabetes. Es importante conocer la cantidad de carbohidratos que se pueden ingerir en cada comida sin correr ningn riesgo. Esto es diferente en cada persona. Su nutricionista puede ayudarlo a calcular la cantidad de carbohidratos que debe ingerir en cada comida y en cada  refrigerio. Entre los alimentos que contienen carbohidratos, se incluyen:  Pan, cereal, arroz, pastas y galletas.  Papas y maz.  Guisantes, frijoles y lentejas.  Leche y yogur.  Frutas y jugo.  Postres, como pasteles, galletas, helado y caramelos. Cmo me afecta el alcohol? El alcohol puede provocar disminuciones sbitas de la glucemia (hipoglucemia), especialmente si utiliza insulina o toma determinados medicamentos por va oral para la diabetes. La hipoglucemia es una afeccin potencialmente mortal. Los sntomas de la hipoglucemia (somnolencia, mareos y confusin) son similares a los sntomas de haber consumido demasiado alcohol. Si el mdico afirma que el alcohol es seguro para usted, siga estas pautas:  Limite el consumo de alcohol a no ms de 1medida por da si es mujer y no est embarazada, y a 2medidas si es hombre. Una medida equivale a 12oz (355ml) de cerveza, 5oz (148ml) de vino o 1oz (44ml) de bebidas alcohlicas de alta graduacin.  No beba con el estmago vaco.  Mantngase hidratado bebiendo agua, refrescos dietticos o t helado sin azcar.  Tenga en cuenta que los refrescos comunes, los jugos y otras bebida para mezclar pueden contener mucha azcar y se deben contar como carbohidratos. Cules son algunos consejos para seguir este plan?  Leer las etiquetas de los alimentos  Comience por leer el tamao de la porcin en la "Informacin nutricional" en las etiquetas de los alimentos envasados y las bebidas. La cantidad de caloras, carbohidratos, grasas y otros nutrientes mencionados en la etiqueta se basan en una porcin del alimento. Muchos alimentos contienen ms de una porcin por envase.  Verifique la cantidad total de gramos (g) de carbohidratos totales en una porcin. Puede calcular la cantidad de porciones de carbohidratos al dividir el   total de carbohidratos por 15. Por ejemplo, si un alimento tiene un total de 30g de carbohidratos, equivale a 2  porciones de carbohidratos.  Verifique la cantidad de gramos (g) de grasas saturadas y grasas trans en una porcin. Escoja alimentos que no contengan grasa o que tengan un bajo contenido.  Verifique la cantidad de miligramos (mg) de sal (sodio) en una porcin. La mayora de las personas deben limitar la ingesta de sodio total a menos de 2300mg por da.  Siempre consulte la informacin nutricional de los alimentos etiquetados como "con bajo contenido de grasa" o "sin grasa". Estos alimentos pueden tener un mayor contenido de azcar agregada o carbohidratos refinados, y deben evitarse.  Hable con su nutricionista para identificar sus objetivos diarios en cuanto a los nutrientes mencionados en la etiqueta. Al ir de compras  Evite comprar alimentos procesados, enlatados o precocinados. Estos alimentos tienden a tener una mayor cantidad de grasa, sodio y azcar agregada.  Compre en la zona exterior de la tienda de comestibles. Esta zona incluye frutas y verduras frescas, granos a granel, carnes frescas y productos lcteos frescos. Al cocinar  Utilice mtodos de coccin a baja temperatura, como hornear, en lugar de mtodos de coccin a alta temperatura, como frer en abundante aceite.  Cocine con aceites saludables, como el aceite de oliva, canola o girasol.  Evite cocinar con manteca, crema o carnes con alto contenido de grasa. Planificacin de las comidas  Coma las comidas y los refrigerios regularmente, preferentemente a la misma hora todos los das. Evite pasar largos perodos de tiempo sin comer.  Consuma alimentos ricos en fibra, como frutas frescas, verduras, frijoles y cereales integrales. Consulte a su nutricionista sobre cuntas porciones de carbohidratos puede consumir en cada comida.  Consuma entre 4 y 6 onzas (oz) de protenas magras por da, como carnes magras, pollo, pescado, huevos o tofu. Una onza de protena magra equivale a: ? 1 onza de carne, pollo o  pescado. ? 1huevo. ?  taza de tofu.  Coma algunos alimentos por da que contengan grasas saludables, como aguacates, frutos secos, semillas y pescado. Estilo de vida  Controle su nivel de glucemia con regularidad.  Haga actividad fsica habitualmente como se lo haya indicado el mdico. Esto puede incluir lo siguiente: ? 150minutos semanales de ejercicio de intensidad moderada o alta. Esto podra incluir caminatas dinmicas, ciclismo o gimnasia acutica. ? Realizar ejercicios de elongacin y de fortalecimiento, como yoga o levantamiento de pesas, por lo menos 2veces por semana.  Tome los medicamentos como se lo haya indicado el mdico.  No consuma ningn producto que contenga nicotina o tabaco, como cigarrillos y cigarrillos electrnicos. Si necesita ayuda para dejar de fumar, consulte al mdico.  Trabaje con un asesor o instructor en diabetes para identificar estrategias para controlar el estrs y cualquier desafo emocional y social. Preguntas para hacerle al mdico  Es necesario que consulte a un instructor en el cuidado de la diabetes?  Es necesario que me rena con un nutricionista?  A qu nmero puedo llamar si tengo preguntas?  Cules son los mejores momentos para controlar la glucemia? Dnde encontrar ms informacin:  Asociacin Estadounidense de la Diabetes (American Diabetes Association): diabetes.org  Academia de Nutricin y Diettica (Academy of Nutrition and Dietetics): www.eatright.org  Instituto Nacional de la Diabetes y las Enfermedades Digestivas y Renales (National Institute of Diabetes and Digestive and Kidney Diseases, NIH): www.niddk.nih.gov Resumen  Un plan de alimentacin saludable lo ayudar a controlar la glucemia y mantener un estilo de vida saludable.    Trabajar con un especialista en dietas y nutricin (nutricionista) puede ayudarlo a elaborar el mejor plan de alimentacin para usted.  Tenga en cuenta que los carbohidratos (hidratos de  carbono) y el alcohol tienen efectos inmediatos en sus niveles de glucemia. Es importante contar los carbohidratos que ingiere y consumir alcohol con prudencia. Esta informacin no tiene como fin reemplazar el consejo del mdico. Asegrese de hacerle al mdico cualquier pregunta que tenga. Document Revised: 10/06/2016 Document Reviewed: 05/18/2016 Elsevier Patient Education  2020 [employer]vier Inc.  

## 2019-08-31 ENCOUNTER — Other Ambulatory Visit: Payer: Self-pay | Admitting: Family Medicine

## 2019-08-31 LAB — LP+NON-HDL CHOLESTEROL
Cholesterol, Total: 106 mg/dL (ref 100–199)
HDL: 38 mg/dL — ABNORMAL LOW (ref >39)
LDL Chol Calc (NIH): 48 mg/dL (ref 0–99)
Total Non-HDL-Chol (LDL+VLDL): 68 mg/dL (ref 0–129)
Triglycerides: 108 mg/dL (ref 0–149)
VLDL Cholesterol Cal: 20 mg/dL (ref 5–40)

## 2019-08-31 LAB — CMP14+EGFR
ALT: 10 IU/L (ref 0–32)
AST: 14 IU/L (ref 0–40)
Albumin/Globulin Ratio: 1.3 (ref 1.2–2.2)
Albumin: 4.3 g/dL (ref 3.8–4.9)
Alkaline Phosphatase: 136 IU/L — ABNORMAL HIGH (ref 48–121)
BUN/Creatinine Ratio: 25 — ABNORMAL HIGH (ref 9–23)
BUN: 21 mg/dL (ref 6–24)
Bilirubin Total: 0.3 mg/dL (ref 0.0–1.2)
CO2: 22 mmol/L (ref 20–29)
Calcium: 9.9 mg/dL (ref 8.7–10.2)
Chloride: 106 mmol/L (ref 96–106)
Creatinine, Ser: 0.83 mg/dL (ref 0.57–1.00)
GFR calc Af Amer: 93 mL/min/{1.73_m2} (ref >59)
GFR calc non Af Amer: 81 mL/min/{1.73_m2} (ref >59)
Globulin, Total: 3.3 g/dL (ref 1.5–4.5)
Glucose: 79 mg/dL (ref 65–99)
Potassium: 4.9 mmol/L (ref 3.5–5.2)
Sodium: 143 mmol/L (ref 134–144)
Total Protein: 7.6 g/dL (ref 6.0–8.5)

## 2019-08-31 LAB — MICROALBUMIN / CREATININE URINE RATIO
Creatinine, Urine: 30.3 mg/dL
Microalb/Creat Ratio: 11 mg/g creat (ref 0–29)
Microalbumin, Urine: 3.4 ug/mL

## 2019-08-31 LAB — HCV RNA QUANT RFLX ULTRA OR GENOTYP: HCV Quant Baseline: NOT DETECTED IU/mL

## 2019-09-07 ENCOUNTER — Telehealth: Payer: Self-pay

## 2019-09-07 NOTE — Telephone Encounter (Signed)
-----   Message from Hoy Register, MD sent at 08/31/2019  8:20 PM EDT ----- Labs are stable

## 2019-09-07 NOTE — Telephone Encounter (Signed)
Patient was called and informed of lab results via voicemail. 

## 2019-11-29 ENCOUNTER — Other Ambulatory Visit: Payer: Self-pay

## 2019-11-29 ENCOUNTER — Encounter: Payer: Self-pay | Admitting: Family Medicine

## 2019-11-29 ENCOUNTER — Other Ambulatory Visit: Payer: Self-pay | Admitting: Family Medicine

## 2019-11-29 ENCOUNTER — Ambulatory Visit: Payer: Self-pay | Attending: Family Medicine | Admitting: Family Medicine

## 2019-11-29 VITALS — BP 110/66 | HR 60 | Ht 62.0 in | Wt 149.2 lb

## 2019-11-29 DIAGNOSIS — I83891 Varicose veins of right lower extremities with other complications: Secondary | ICD-10-CM

## 2019-11-29 DIAGNOSIS — Z634 Disappearance and death of family member: Secondary | ICD-10-CM

## 2019-11-29 DIAGNOSIS — I10 Essential (primary) hypertension: Secondary | ICD-10-CM

## 2019-11-29 DIAGNOSIS — Z23 Encounter for immunization: Secondary | ICD-10-CM

## 2019-11-29 DIAGNOSIS — E1165 Type 2 diabetes mellitus with hyperglycemia: Secondary | ICD-10-CM

## 2019-11-29 LAB — POCT GLYCOSYLATED HEMOGLOBIN (HGB A1C): HbA1c, POC (controlled diabetic range): 7.6 % — AB (ref 0.0–7.0)

## 2019-11-29 LAB — GLUCOSE, POCT (MANUAL RESULT ENTRY): POC Glucose: 114 mg/dl — AB (ref 70–99)

## 2019-11-29 MED ORDER — ATORVASTATIN CALCIUM 40 MG PO TABS: 40.0000 mg | ORAL_TABLET | Freq: Every day | ORAL | 6 refills | Status: DC

## 2019-11-29 MED ORDER — LISINOPRIL 2.5 MG PO TABS
2.5000 mg | ORAL_TABLET | Freq: Every day | ORAL | 6 refills | Status: DC
Start: 2019-11-29 — End: 2020-04-22

## 2019-11-29 MED ORDER — METFORMIN HCL 1000 MG PO TABS: 1000.0000 mg | ORAL_TABLET | Freq: Two times a day (BID) | ORAL | 6 refills | Status: DC

## 2019-11-29 MED ORDER — CANAGLIFLOZIN 100 MG PO TABS: ORAL_TABLET | ORAL | 6 refills | Status: DC

## 2019-11-29 MED ORDER — GLIPIZIDE 10 MG PO TABS: 10.0000 mg | ORAL_TABLET | Freq: Two times a day (BID) | ORAL | 6 refills | Status: DC

## 2019-11-29 NOTE — Progress Notes (Signed)
Subjective:  Patient ID: Margaret Pace, female    DOB: 08-12-1965  Age: 54 y.o. MRN: [id number]  CC: Diabetes   HPI Margaret Pace is a 54 year old female with a history of type 2 diabetes mellitus (A1c7.6), hypertension who presents today for follow-up visit. She lost her daughter last month; states she has a good days and bad days but overall feels okay.   Compliant with her medications for diabetes and denies hypoglycemia or numbness in extremities.  Compliant with her hypertensive medication and her statin and denies adverse effects from her medications. She is not up-to-date on annual eye exam due to lack of medical coverage. She does have a varicose vein in her right leg and is wondering what can be done about it.  Denies presence of pain  Past Medical History:  Diagnosis Date   Anemia    Pt reported blood transfusion after left leg fracture.   Carpal tunnel syndrome, bilateral    Diabetes mellitus without complication [facility])     Past Surgical History:  Procedure Laterality Date   APPLICATION OF WOUND VAC Left 05/14/2014   Procedure: APPLICATION OF WOUND VAC;  Surgeon: Myrene Galas, MD;  Location: [facility] OR;  Service: Orthopedics;  Laterality: Left;   FASCIOTOMY Left 05/14/2014   Procedure: FOUR COMPARTMENT FASCIOTOMY;  Surgeon: Myrene Galas, MD;  Location: [facility] OR;  Service: Orthopedics;  Laterality: Left;   FRACTURE SURGERY     HARDWARE REMOVAL Left 10/19/2014   Procedure: HARDWARE REMOVAL LEFT TIBIA AND ANKLE;  Surgeon: Myrene Galas, MD;  Location: [facility] OR;  Service: Orthopedics;  Laterality: Left;   SECONDARY CLOSURE OF WOUND Left 05/17/2014   Procedure: WOUND CLOSURE LEFT LEG ;  Surgeon: Myrene Galas, MD;  Location: [facility] OR;  Service: Orthopedics;  Laterality: Left;   TIBIA IM NAIL INSERTION Left 05/14/2014   Procedure: INTRAMEDULLARY (IM) NAIL TIBIAL;  Surgeon: Myrene Galas, MD;  Location: [facility] OR;  Service: Orthopedics;  Laterality: Left;    Family History    Problem Relation Age of Onset   Diabetes Mother    Diabetes Sister    Diabetes Brother    Diabetes Sister    Diabetes Sister    Diabetes Brother     No Known Allergies  Outpatient Medications Prior to Visit  Medication Sig Dispense Refill   Blood Glucose Monitoring Suppl (TRUE METRIX METER) DEVI 1 each by Does not apply route 3 (three) times daily before meals. 1 Device 0   Dulaglutide (TRULICITY) 0.75 MG/0.5ML SOPN Inject 0.5 mLs (0.75 mg total) into the skin once a week. 1 pen 6   glucose blood test strip USE AS DIRECTED THREE TIMES DAILY 100 each 12   Insulin Pen Needle 31G X 5 MM MISC 1 each by Does not apply route at bedtime. 100 each 1   lidocaine (LIDODERM) 5 % Place 1 patch onto the skin daily. Remove & Discard patch within 12 hours or as directed by MD 30 patch 1   methocarbamol (ROBAXIN) 500 MG tablet Take 1 tablet (500 mg total) by mouth every 8 (eight) hours as needed for muscle spasms. 60 tablet 1   naproxen (NAPROSYN) 500 MG tablet TAKE 1 TABLET (500 MG TOTAL) BY MOUTH 2 (TWO) TIMES DAILY WITH A MEAL. 60 tablet 1   predniSONE (DELTASONE) 20 MG tablet Take 1 tablet (20 mg total) by mouth daily with breakfast. 5 tablet 0   TRUEPLUS LANCETS 28G MISC 1 each by Does not apply route 3 (three) times daily before  meals. 100 each 12   atorvastatin (LIPITOR) 40 MG tablet Take 1 tablet (40 mg total) by mouth daily. 30 tablet 6   canagliflozin (INVOKANA) 100 MG TABS tablet TAKE 1 TABLET (100 MG TOTAL) BY MOUTH DAILY BEFORE BREAKFAST. 30 tablet 6   glipiZIDE (GLUCOTROL) 10 MG tablet Take 1 tablet (10 mg total) by mouth 2 (two) times daily before a meal. 60 tablet 6   lisinopril (ZESTRIL) 2.5 MG tablet Take 1 tablet (2.5 mg total) by mouth daily. 30 tablet 6   metFORMIN (GLUCOPHAGE) 1000 MG tablet Take 1 tablet (1,000 mg total) by mouth 2 (two) times daily with a meal. 60 tablet 6   HYDROcodone-acetaminophen (NORCO/VICODIN) 5-325 MG tablet Take 1 tablet by mouth  every 6 (six) hours as needed for severe pain. (Patient not taking: Reported on 05/19/2019) 15 tablet 0   ondansetron (ZOFRAN ODT) 4 MG disintegrating tablet 4mg  ODT q4 hours prn nausea/vomit (Patient not taking: Reported on 05/30/2019) 10 tablet 0   polyethylene glycol-electrolytes (TRILYTE) 420 g solution Take 4,000 mLs by mouth as directed. (Patient not taking: Reported on 01/28/2017) 4000 mL 0   valACYclovir (VALTREX) 1000 MG tablet Take 1 tablet (1,000 mg total) by mouth 3 (three) times daily. (Patient not taking: Reported on 10/26/2018) 21 tablet 0   No facility-administered medications prior to visit.     ROS Review of Systems  Constitutional: Negative for activity change, appetite change and fatigue.  HENT: Negative for congestion, sinus pressure and sore throat.   Eyes: Negative for visual disturbance.  Respiratory: Negative for cough, chest tightness, shortness of breath and wheezing.   Cardiovascular: Negative for chest pain and palpitations.  Gastrointestinal: Negative for abdominal distention, abdominal pain and constipation.  Endocrine: Negative for polydipsia.  Genitourinary: Negative for dysuria and frequency.  Musculoskeletal: Negative for arthralgias and back pain.  Skin: Negative for rash.  Neurological: Negative for tremors, light-headedness and numbness.  Hematological: Does not bruise/bleed easily.  Psychiatric/Behavioral: Negative for agitation and behavioral problems.    Objective:  BP 110/66    Pulse 60    Ht 5\' 2"  (1.575 m)    Wt 149 lb 3.2 oz (67.7 kg)    LMP 11/12/2013    SpO2 99%    BMI 27.29 kg/m   BP/Weight 11/29/2019 08/29/2019 07/29/2019  Systolic BP 110 123 100  Diastolic BP 66 66 66  Wt. (Lbs) 149.2 155 -  BMI 27.29 28.35 -      Physical Exam Constitutional:      Appearance: She is well-developed.  Neck:     Vascular: No JVD.  Cardiovascular:     Rate and Rhythm: Normal rate.     Heart sounds: Normal heart sounds. No murmur heard.    Pulmonary:     Effort: Pulmonary effort is normal.     Breath sounds: Normal breath sounds. No wheezing or rales.  Chest:     Chest wall: No tenderness.  Abdominal:     General: Bowel sounds are normal. There is no distension.     Palpations: Abdomen is soft. There is no mass.     Tenderness: There is no abdominal tenderness.  Musculoskeletal:        General: Normal range of motion.     Right lower leg: No edema.     Left lower leg: No edema.     Comments: Right leg with nontender varicose vein  Neurological:     Mental Status: She is alert and oriented to person, place, and time.  Psychiatric:        Mood and Affect: Mood normal.     CMP Latest Ref Rng & Units 08/29/2019 05/30/2019 10/26/2018  Glucose 65 - 99 mg/dL 79 623(J) 94  BUN 6 - 24 mg/dL 21 13 62(G)  Creatinine 0.57 - 1.00 mg/dL 3.15 1.76 1.60  Sodium 134 - 144 mmol/L 143 141 140  Potassium 3.5 - 5.2 mmol/L 4.9 4.6 4.9  Chloride 96 - 106 mmol/L 106 104 105  CO2 20 - 29 mmol/L 22 21 24   Calcium 8.7 - 10.2 mg/dL 9.9 9.8 9.9  Total Protein 6.0 - 8.5 g/dL 7.6 6.8 -  Total Bilirubin 0.0 - 1.2 mg/dL 0.3 0.4 -  Alkaline Phos 48 - 121 IU/L 136(H) 140(H) -  AST 0 - 40 IU/L 14 14 -  ALT 0 - 32 IU/L 10 10 -    Lipid Panel     Component Value Date/Time   CHOL 106 08/29/2019 1200   TRIG 108 08/29/2019 1200   HDL 38 (L) 08/29/2019 1200   CHOLHDL 2.5 05/30/2019 1051   CHOLHDL 3.3 04/14/2016 0928   VLDL 45 (H) 09/25/2015 1144   LDLCALC 48 08/29/2019 1200    CBC    Component Value Date/Time   WBC 9.5 12/20/2017 1403   RBC 4.38 12/20/2017 1403   HGB 12.6 12/20/2017 1403   HCT 40.3 12/20/2017 1403   PLT 212 12/20/2017 1403   MCV 92.0 12/20/2017 1403   MCH 28.8 12/20/2017 1403   MCHC 31.3 12/20/2017 1403   RDW 14.2 12/20/2017 1403   LYMPHSABS 0.6 (L) 12/20/2017 1403   MONOABS 0.6 12/20/2017 1403   EOSABS 0.0 12/20/2017 1403   BASOSABS 0.0 12/20/2017 1403    Lab Results  Component Value Date   HGBA1C 7.6 (A)  11/29/2019    Assessment & Plan:  1. Type 2 diabetes mellitus with hyperglycemia, without long-term current use of insulin (HCC) Slightly above goal with A1c of 7.6; goal is less than 7.0 If A1c remains elevated at next visit, consider increasing Trulicity to 1.5 mg Counseled on Diabetic diet, my plate method, 12/01/2019 minutes of moderate intensity exercise/week Blood sugar logs with fasting goals of 80-120 mg/dl, random of less than 737 and in the event of sugars less than 60 mg/dl or greater than 106 mg/dl encouraged to notify the clinic. Advised on the need for annual eye exams, annual foot exams, Pneumonia vaccine. - POCT glucose (manual entry) - POCT glycosylated hemoglobin (Hb A1C) - atorvastatin (LIPITOR) 40 MG tablet; Take 1 tablet (40 mg total) by mouth daily.  Dispense: 30 tablet; Refill: 6 - canagliflozin (INVOKANA) 100 MG TABS tablet; TAKE 1 TABLET (100 MG TOTAL) BY MOUTH DAILY BEFORE BREAKFAST.  Dispense: 30 tablet; Refill: 6 - glipiZIDE (GLUCOTROL) 10 MG tablet; Take 1 tablet (10 mg total) by mouth 2 (two) times daily before a meal.  Dispense: 60 tablet; Refill: 6 - metFORMIN (GLUCOPHAGE) 1000 MG tablet; Take 1 tablet (1,000 mg total) by mouth 2 (two) times daily with a meal.  Dispense: 60 tablet; Refill: 6  2. Essential hypertension Controlled - lisinopril (ZESTRIL) 2.5 MG tablet; Take 1 tablet (2.5 mg total) by mouth daily.  Dispense: 30 tablet; Refill: 6  3. 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 Need for immunization against influenza - Flu Vaccine QUAD 36+ mos IM  5. Bereavement Offered support services by  means of therapy with LCSW, grief counseling however she declines.  Advised to get in touch with the clinic if she changes her mind.    Meds ordered this encounter  Medications   atorvastatin  (LIPITOR) 40 MG tablet    Sig: Take 1 tablet (40 mg total) by mouth daily.    Dispense:  30 tablet    Refill:  6   canagliflozin (INVOKANA) 100 MG TABS tablet    Sig: TAKE 1 TABLET (100 MG TOTAL) BY MOUTH DAILY BEFORE BREAKFAST.    Dispense:  30 tablet    Refill:  6   glipiZIDE (GLUCOTROL) 10 MG tablet    Sig: Take 1 tablet (10 mg total) by mouth 2 (two) times daily before a meal.    Dispense:  60 tablet    Refill:  6   lisinopril (ZESTRIL) 2.5 MG tablet    Sig: Take 1 tablet (2.5 mg total) by mouth daily.    Dispense:  30 tablet    Refill:  6   metFORMIN (GLUCOPHAGE) 1000 MG tablet    Sig: Take 1 tablet (1,000 mg total) by mouth 2 (two) times daily with a meal.    Dispense:  60 tablet    Refill:  6    Follow-up: Return in about 6 months (around 05/29/2020) for chronic disease management.       Hoy RegisterEnobong Aleka Twitty, MD, FAAFP. [facility] [place], [place] [phone]   11/29/2019, 1:03 PM

## 2019-11-29 NOTE — Patient Instructions (Signed)
Venas varicosas Varicose Veins Las venas varicosas son venas que se han agrandado y abultado, y se han tornado sinuosas. Suelen aparecer en las piernas. Cules son las causas? La causa de esta afeccin es el dao en las vlvulas de la vena. Estas vlvulas ayudan a que la sangre regrese al corazn. Cuando estn daadas y dejan de funcionar correctamente, la sangre puede ir en direccin inversa y retornar a las venas cerca de la piel, lo que hace que las venas se agranden y se vean sinuosas. La afeccin puede surgir por cualquier factor que haga que la sangre retorne, como un embarazo, una actividad que obligue a estar de pie de forma prolongada o la obesidad. Qu incrementa el riesgo? Es ms probable que esta afeccin se manifieste en las personas con estas caractersticas:  Permanecen de pie mucho tiempo.  Estn embarazadas.  Tienen sobrepeso. Cules son los signos o los sntomas? Los sntomas de esta afeccin incluyen:  Venas abultadas, sinuosas y azuladas.  Sensacin de pesadez. Estos sntomas pueden empeorar hacia el final del da.  Dolor en las piernas. Estos sntomas pueden empeorar hacia el final del da.  Hinchazn en la pierna.  Cambios en el color de la piel que est sobre las venas. Cmo se diagnostica? Esta afeccin se puede diagnosticar en funcin de los sntomas, un examen fsico y una ecografa. Cmo se trata? El tratamiento de esta afeccin puede incluir lo siguiente:  Evitar estar sentado o de pie en la misma posicin durante mucho tiempo.  Usar medias de compresin. Estas medias ayudan a evitar la formacin de cogulos de sangre y a reducir la hinchazn de las piernas.  Levantar (elevar) las piernas al descansar.  Bajar de peso.  Hacer actividad fsica con regularidad. Si tiene sntomas persistentes o desea mejorar el aspecto de las venas varicosas, puede optar por un procedimiento para anular dichas venas o extraerlas. Entre los tratamientos para anular  las venas, se incluyen los siguientes:  Escleroterapia. En este tratamiento, se inyecta una solucin en la vena para anularla.  Tratamiento con lser. En este tratamiento, la vena se calienta con un lser para anularla.  Ablacin venosa por radiofrecuencia. En este tratamiento, se usa una corriente elctrica que se produce mediante ondas de radio para anular la vena. Entre los tratamientos para extraer las venas, se incluyen los siguientes:  Flebectoma. En este tratamiento, las venas se extraen a travs de pequeas incisiones que se realizan por encima de las venas.  Ligadura venosa y varicectoma. En este tratamiento, se realizan incisiones por encima de las venas. Luego, las venas se extraen despus de atarse (ligarse) con puntos (suturas). Siga estas instrucciones en su casa: Actividad  Camine todo lo que pueda. Caminar aumenta el flujo sanguneo. Esto ayuda a que la sangre regrese al corazn y alivia la presin en las venas. Tambin aumenta la fuerza cardiovascular.  Siga las instrucciones del mdico con respecto al ejercicio.  No permanezca de pie o sentado en una misma posicin durante mucho tiempo.  No se siente con las piernas cruzadas.  Descanse con las piernas levantadas durante el da. Instrucciones generales   Siga las instrucciones del mdico en lo que respecta a la dieta.  Use medias de compresin como se lo haya indicado su mdico. No use otra clase de vestimenta ajustada alrededor de las piernas, la pelvis o la cintura.  De noche, eleve las piernas a una altura superior al nivel del corazn.  Si se corta en la piel que est por encima de   una vena varicosa y esta sangra: ? Recustese con la pierna levantada. ? Coloque un pao limpio sobre el corte y presione firmemente sobre l hasta que el sangrado se detenga. ? Aplique una venda (vendaje) sobre el corte. Comunquese con un mdico si:  La piel alrededor de las venas varicosas empieza a agrietarse.  Siente  dolor o tiene enrojecimiento, sensibilidad o hinchazn dura sobre la vena.  Est incmodo debido al dolor.  Se corta en la piel de encima de una vena varicosa y no deja de sangrar. Resumen  Las venas varicosas son venas que se han agrandado y abultado, y se han tornado sinuosas. Suelen aparecer en las piernas.  La causa de esta afeccin es el dao en las vlvulas de la vena. Estas vlvulas ayudan a que la sangre regrese al corazn.  El tratamiento de esta afeccin incluye hacer movimientos frecuentes, usar medias de compresin, perder peso y hacer ejercicios de forma regular. En algunos casos, se realizan procedimientos que anulan o extraen las venas.  El tratamiento de esta afeccin puede incluir usar medias de compresin, elevar las piernas, perder peso y realizar actividades de forma regular. En algunos casos, se realizan procedimientos que anulan o extraen las venas. Esta informacin no tiene como fin reemplazar el consejo del mdico. Asegrese de hacerle al mdico cualquier pregunta que tenga. Document Revised: 04/20/2018 Document Reviewed: 04/20/2018 Elsevier Patient Education  2020 [employer]vier Inc.  

## 2020-02-13 ENCOUNTER — Other Ambulatory Visit: Payer: Self-pay | Admitting: Family Medicine

## 2020-02-13 DIAGNOSIS — M778 Other enthesopathies, not elsewhere classified: Secondary | ICD-10-CM

## 2020-04-22 ENCOUNTER — Other Ambulatory Visit: Payer: Self-pay

## 2020-04-22 ENCOUNTER — Other Ambulatory Visit: Payer: Self-pay | Admitting: Family Medicine

## 2020-04-22 ENCOUNTER — Ambulatory Visit: Payer: Self-pay | Attending: Family Medicine | Admitting: Family Medicine

## 2020-04-22 ENCOUNTER — Encounter: Payer: Self-pay | Admitting: Family Medicine

## 2020-04-22 DIAGNOSIS — E1165 Type 2 diabetes mellitus with hyperglycemia: Secondary | ICD-10-CM

## 2020-04-22 DIAGNOSIS — Z1159 Encounter for screening for other viral diseases: Secondary | ICD-10-CM

## 2020-04-22 DIAGNOSIS — Z1211 Encounter for screening for malignant neoplasm of colon: Secondary | ICD-10-CM

## 2020-04-22 DIAGNOSIS — I10 Essential (primary) hypertension: Secondary | ICD-10-CM

## 2020-04-22 DIAGNOSIS — M778 Other enthesopathies, not elsewhere classified: Secondary | ICD-10-CM

## 2020-04-22 MED ORDER — GLIPIZIDE 10 MG PO TABS: 10.0000 mg | ORAL_TABLET | Freq: Two times a day (BID) | ORAL | 6 refills | Status: DC

## 2020-04-22 MED ORDER — METFORMIN HCL 1000 MG PO TABS: 1000.0000 mg | ORAL_TABLET | Freq: Two times a day (BID) | ORAL | 6 refills | Status: DC

## 2020-04-22 MED ORDER — LISINOPRIL 2.5 MG PO TABS: 2.5000 mg | ORAL_TABLET | Freq: Every day | ORAL | 6 refills | Status: DC

## 2020-04-22 MED ORDER — CANAGLIFLOZIN 100 MG PO TABS: ORAL_TABLET | ORAL | 6 refills | Status: DC

## 2020-04-22 NOTE — Telephone Encounter (Signed)
   Notes to clinic:  Patient has appt today    Requested Prescriptions  Pending Prescriptions Disp Refills   naproxen (NAPROSYN) 500 MG tablet [Pharmacy Med Name: NAPROXEN 500 MG TABLET 500 Tablet] 60 tablet 1    Sig: TAKE 1 TABLET (500 MG TOTAL) BY MOUTH 2 (TWO) TIMES DAILY WITH A MEAL.      Analgesics:  NSAIDS Failed - 04/22/2020 11:29 AM      Failed - HGB in normal range and within 360 days    Hemoglobin  Date Value Ref Range Status  12/20/2017 12.6 12.0 - 15.0 g/dL Final          Passed - Cr in normal range and within 360 days    Creat  Date Value Ref Range Status  04/14/2016 0.76 0.50 - 1.05 mg/dL Final    Comment:      For patients > or = 54 years of age: The upper reference limit for Creatinine is approximately 13% higher for people identified as African-American.      Creatinine, Ser  Date Value Ref Range Status  08/29/2019 0.83 0.57 - 1.00 mg/dL Final   Creatinine, Urine  Date Value Ref Range Status  04/14/2016 75 20 - 320 mg/dL Final          Passed - Patient is not pregnant      Passed - Valid encounter within last 12 months    Recent Outpatient Visits           4 months ago Type 2 diabetes mellitus with hyperglycemia, without long-term current use of insulin (HCC)   [facility] [place], [place], MD   7 months ago Type 2 diabetes mellitus with hyperglycemia, without long-term current use of insulin (HCC)   [facility] [place], [place], MD   10 months ago Type 2 diabetes mellitus with hyperglycemia, without long-term current use of insulin (HCC)   [facility] Hoy Register, MD   1 year ago Musculoskeletal back pain   [facility] [place], Odette [place], MD   1 year ago Type 2 diabetes mellitus with hyperglycemia, without long-term current use of insulin [facility])   [facility] Hoy Register, MD

## 2020-04-22 NOTE — Progress Notes (Signed)
Virtual Visit via Telephone Note  I connected with Margaret Pace, on 04/22/2020 at 11:44 AM by telephone due to the COVID-19 pandemic and verified that I am speaking with the correct person using two identifiers.   Consent: I discussed the limitations, risks, security and privacy concerns of performing an evaluation and management service by telephone and the availability of in person appointments. I also discussed with the patient that there may be a patient responsible charge related to this service. The patient expressed understanding and agreed to proceed.   Location of Patient: Environmental education officer of Provider: Clinic   Persons participating in Telemedicine visit: Rocquel Askren Farrington-CMA Dr. Margarita Rana     History of Present Illness: Margaret Pace is a 55 year old female with a history of type 2 diabetes mellitus (A1c7.6), hypertension who presents today for follow-up visit.  Her blood sugars have been better since she started exercising. Random sugars are 160-180; fasting sugars range 108-116. Denies presence of hypoglycemia, numbness in feet. Sometimes has blurry vision; not up to date on annual eye exam.  States she went to [facility] for an eye exam and was informed she needed a referral from her PCP. Compliant with her antihypertensive. She has no chest pain or dyspnea and has no pedal edema. She has no acute concerns today. She is due for complete physical exam  Past Medical History:  Diagnosis Date  . Anemia    Pt reported blood transfusion after left leg fracture.  . Carpal tunnel syndrome, bilateral   . Diabetes mellitus without complication ([place])    No Known Allergies  Current Outpatient Medications on File Prior to Visit  Medication Sig Dispense Refill  . atorvastatin (LIPITOR) 40 MG tablet Take 1 tablet (40 mg total) by mouth daily. 30 tablet 6  . Blood Glucose Monitoring Suppl (TRUE METRIX METER) DEVI 1 each by Does not apply  route 3 (three) times daily before meals. 1 Device 0  . canagliflozin (INVOKANA) 100 MG TABS tablet TAKE 1 TABLET (100 MG TOTAL) BY MOUTH DAILY BEFORE BREAKFAST. 30 tablet 6  . Dulaglutide (TRULICITY) 1.01 BP/1.0CH SOPN Inject 0.5 mLs (0.75 mg total) into the skin once a week. 1 pen 6  . glipiZIDE (GLUCOTROL) 10 MG tablet Take 1 tablet (10 mg total) by mouth 2 (two) times daily before a meal. 60 tablet 6  . glucose blood test strip USE AS DIRECTED THREE TIMES DAILY 100 each 12  . HYDROcodone-acetaminophen (NORCO/VICODIN) 5-325 MG tablet Take 1 tablet by mouth every 6 (six) hours as needed for severe pain. (Patient not taking: Reported on 05/19/2019) 15 tablet 0  . Insulin Pen Needle 31G X 5 MM MISC 1 each by Does not apply route at bedtime. 100 each 1  . lidocaine (LIDODERM) 5 % Place 1 patch onto the skin daily. Remove & Discard patch within 12 hours or as directed by MD 30 patch 1  . lisinopril (ZESTRIL) 2.5 MG tablet Take 1 tablet (2.5 mg total) by mouth daily. 30 tablet 6  . metFORMIN (GLUCOPHAGE) 1000 MG tablet Take 1 tablet (1,000 mg total) by mouth 2 (two) times daily with a meal. 60 tablet 6  . methocarbamol (ROBAXIN) 500 MG tablet Take 1 tablet (500 mg total) by mouth every 8 (eight) hours as needed for muscle spasms. 60 tablet 1  . naproxen (NAPROSYN) 500 MG tablet TAKE 1 TABLET (500 MG TOTAL) BY MOUTH 2 (TWO) TIMES DAILY WITH A MEAL. 60 tablet 1  . ondansetron (ZOFRAN  ODT) 4 MG disintegrating tablet 38m ODT q4 hours prn nausea/vomit (Patient not taking: Reported on 05/30/2019) 10 tablet 0  . polyethylene glycol-electrolytes (TRILYTE) 420 g solution Take 4,000 mLs by mouth as directed. (Patient not taking: Reported on 01/28/2017) 4000 mL 0  . predniSONE (DELTASONE) 20 MG tablet Take 1 tablet (20 mg total) by mouth daily with breakfast. 5 tablet 0  . TRUEPLUS LANCETS 28G MISC 1 each by Does not apply route 3 (three) times daily before meals. 100 each 12  . valACYclovir (VALTREX) 1000 MG tablet  Take 1 tablet (1,000 mg total) by mouth 3 (three) times daily. (Patient not taking: Reported on 10/26/2018) 21 tablet 0   No current facility-administered medications on file prior to visit.    ROS: See HPI  Observations/Objective: Awake, alert, oriented x3 Not in acute distress Normal mood  Lab Results  Component Value Date   HGBA1C 7.6 (A) 11/29/2019    CMP Latest Ref Rng & Units 08/29/2019 05/30/2019 10/26/2018  Glucose 65 - 99 mg/dL 79 158(H) 94  BUN 6 - 24 mg/dL 21 13 28(H)  Creatinine 0.57 - 1.00 mg/dL 0.83 0.82 0.87  Sodium 134 - 144 mmol/L 143 141 140  Potassium 3.5 - 5.2 mmol/L 4.9 4.6 4.9  Chloride 96 - 106 mmol/L 106 104 105  CO2 20 - 29 mmol/L _0 Calcium 8.7 - 10.2 mg/dL 9.9 9.8 9.9  Total Protein 6.0 - 8.5 g/dL 7.6 6.8 -  Total Bilirubin 0.0 - 1.2 mg/dL 0.3 0.4 -  Alkaline Phos 48 - 121 IU/L 136(H) 140(H) -  AST 0 - 40 IU/L 14 14 -  ALT 0 - 32 IU/L 10 10 -    Lipid Panel     Component Value Date/Time   CHOL 106 08/29/2019 1200   TRIG 108 08/29/2019 1200   HDL 38 (L) 08/29/2019 1200   CHOLHDL 2.5 05/30/2019 1051   CHOLHDL 3.3 04/14/2016 0928   VLDL 45 (H) 09/25/2015 1144   LDLCALC 48 08/29/2019 1200   LABVLDL 20 08/29/2019 1200    Assessment and Plan: 1. Type 2 diabetes mellitus with hyperglycemia, without long-term current use of insulin ([place] Suboptimally controlled Will send of A1c and increase Trulicity dose if still above goal Will also provide referral so she [facility] CMP14+EGFR - Microalbumin / creatinine urine ratio - Lipid panel - Hemoglobin A1c - canagliflozin (INVOKANA) 100 MG TABS tablet; TAKE 1 TABLET (100 MG TOTAL) BY MOUTH DAILY BEFORE BREAKFAST.  Dispense: 30 tablet; Refill: 6 - glipiZIDE (GLUCOTROL) 10 MG tablet; Take 1 tablet (10 mg total) by mouth 2 (two) times daily before a meal.  Dispense: 60 tablet; Refill: 6 - metFORMIN (GLUCOPHAGE) 1000 MG tablet; Take 1 tablet (1,000 mg total) by  mouth 2 (two) times daily with a meal.  Dispense: 60 tablet; Refill: 6 - Ambulatory referral to Ophthalmology  2. Essential hypertension Stable Counseled on blood pressure goal of less than 130/80, low-sodium, DASH diet, medication compliance, 150 minutes of moderate intensity exercise per week. Discussed medication compliance, adverse effects. - lisinopril (ZESTRIL) 2.5 MG tablet; Take 1 tablet (2.5 mg total) by mouth daily.  Dispense: 30 tablet; Refill: 6  3. Screening for colon cancer - Fecal occult blood, imunochemical(Labcorp/Sunquest)  4. Need for hepatitis C screening test - HCV RNA quant rflx ultra or genotyp([facility]/Sunquest)   Follow Up Instructions: 1 month for complete physical exam   I discussed the assessment and treatment plan with the  patient. The patient was provided an opportunity to ask questions and all were answered. The patient agreed with the plan and demonstrated an understanding of the instructions.   The patient was advised to call back or seek an in-person evaluation if the symptoms worsen or if the condition fails to improve as anticipated.     I provided 14 minutes total of non-face-to-face time during this encounter.   Charlott Rakes, MD, FAAFP. [facility] and [place], [place]   04/22/2020, 11:44 AM

## 2020-04-23 ENCOUNTER — Other Ambulatory Visit: Payer: Self-pay | Admitting: Family Medicine

## 2020-04-24 LAB — CMP14+EGFR
ALT: 11 IU/L (ref 0–32)
AST: 18 IU/L (ref 0–40)
Albumin/Globulin Ratio: 1.4 (ref 1.2–2.2)
Albumin: 4.4 g/dL (ref 3.8–4.9)
Alkaline Phosphatase: 126 IU/L — ABNORMAL HIGH (ref 44–121)
BUN/Creatinine Ratio: 18 (ref 9–23)
BUN: 16 mg/dL (ref 6–24)
Bilirubin Total: 0.3 mg/dL (ref 0.0–1.2)
CO2: 22 mmol/L (ref 20–29)
Calcium: 9.6 mg/dL (ref 8.7–10.2)
Chloride: 104 mmol/L (ref 96–106)
Creatinine, Ser: 0.87 mg/dL (ref 0.57–1.00)
Globulin, Total: 3.2 g/dL (ref 1.5–4.5)
Glucose: 75 mg/dL (ref 65–99)
Potassium: 5.1 mmol/L (ref 3.5–5.2)
Sodium: 140 mmol/L (ref 134–144)
Total Protein: 7.6 g/dL (ref 6.0–8.5)
eGFR: 79 mL/min/{1.73_m2} (ref >59)

## 2020-04-24 LAB — LIPID PANEL
Chol/HDL Ratio: 2.9 ratio (ref 0.0–4.4)
Cholesterol, Total: 103 mg/dL (ref 100–199)
HDL: 36 mg/dL — ABNORMAL LOW (ref >39)
LDL Chol Calc (NIH): 37 mg/dL (ref 0–99)
Triglycerides: 186 mg/dL — ABNORMAL HIGH (ref 0–149)
VLDL Cholesterol Cal: 30 mg/dL (ref 5–40)

## 2020-04-24 LAB — MICROALBUMIN / CREATININE URINE RATIO
Creatinine, Urine: 105 mg/dL
Microalb/Creat Ratio: 6 mg/g creat (ref 0–29)
Microalbumin, Urine: 5.9 ug/mL

## 2020-04-24 LAB — HCV RNA QUANT RFLX ULTRA OR GENOTYP: HCV Quant Baseline: NOT DETECTED IU/mL

## 2020-04-24 LAB — HEMOGLOBIN A1C
Est. average glucose Bld gHb Est-mCnc: 151 mg/dL
Hgb A1c MFr Bld: 6.9 % — ABNORMAL HIGH (ref 4.8–5.6)

## 2020-04-26 ENCOUNTER — Telehealth: Payer: Self-pay

## 2020-04-26 NOTE — Telephone Encounter (Signed)
Patient name and DOB has been verified Patient was informed of lab results. Patient had no questions.  

## 2020-04-26 NOTE — Telephone Encounter (Signed)
-----   Message from Hoy Register, MD sent at 04/24/2020  8:28 AM EDT ----- Please inform her that labs are stable.

## 2020-06-03 ENCOUNTER — Other Ambulatory Visit: Payer: Self-pay

## 2020-06-03 MED FILL — Lisinopril Tab 2.5 MG: ORAL | 30 days supply | Qty: 30 | Fill #0 | Status: CN

## 2020-06-03 MED FILL — Glipizide Tab 10 MG: ORAL | 30 days supply | Qty: 60 | Fill #0 | Status: CN

## 2020-06-03 MED FILL — Canagliflozin Tab 100 MG: ORAL | 30 days supply | Qty: 30 | Fill #0 | Status: CN

## 2020-06-04 ENCOUNTER — Other Ambulatory Visit: Payer: Self-pay

## 2020-06-11 ENCOUNTER — Other Ambulatory Visit: Payer: Self-pay

## 2020-06-11 MED FILL — Canagliflozin Tab 100 MG: ORAL | 30 days supply | Qty: 30 | Fill #0 | Status: AC

## 2020-06-11 MED FILL — Metformin HCl Tab 1000 MG: ORAL | 30 days supply | Qty: 60 | Fill #0 | Status: AC

## 2020-06-11 MED FILL — Atorvastatin Calcium Tab 40 MG (Base Equivalent): ORAL | 30 days supply | Qty: 30 | Fill #0 | Status: AC

## 2020-06-11 MED FILL — Glipizide Tab 10 MG: ORAL | 30 days supply | Qty: 60 | Fill #0 | Status: AC

## 2020-06-11 MED FILL — Lisinopril Tab 2.5 MG: ORAL | 30 days supply | Qty: 30 | Fill #0 | Status: AC

## 2020-07-19 ENCOUNTER — Other Ambulatory Visit: Payer: Self-pay

## 2020-07-19 MED FILL — Canagliflozin Tab 100 MG: ORAL | 30 days supply | Qty: 30 | Fill #1 | Status: AC

## 2020-07-19 MED FILL — Glipizide Tab 10 MG: ORAL | 30 days supply | Qty: 60 | Fill #1 | Status: AC

## 2020-07-19 MED FILL — Metformin HCl Tab 1000 MG: ORAL | 30 days supply | Qty: 60 | Fill #1 | Status: AC

## 2020-07-19 MED FILL — Lisinopril Tab 2.5 MG: ORAL | 30 days supply | Qty: 30 | Fill #1 | Status: AC

## 2020-07-19 MED FILL — Atorvastatin Calcium Tab 40 MG (Base Equivalent): ORAL | 30 days supply | Qty: 30 | Fill #1 | Status: AC

## 2020-07-22 ENCOUNTER — Other Ambulatory Visit: Payer: Self-pay

## 2020-08-30 ENCOUNTER — Other Ambulatory Visit: Payer: Self-pay

## 2020-08-30 MED FILL — Atorvastatin Calcium Tab 40 MG (Base Equivalent): ORAL | 30 days supply | Qty: 30 | Fill #2 | Status: AC

## 2020-08-30 MED FILL — Glipizide Tab 10 MG: ORAL | 30 days supply | Qty: 60 | Fill #2 | Status: AC

## 2020-08-30 MED FILL — Metformin HCl Tab 1000 MG: ORAL | 30 days supply | Qty: 60 | Fill #2 | Status: AC

## 2020-08-30 MED FILL — Canagliflozin Tab 100 MG: ORAL | 30 days supply | Qty: 30 | Fill #2 | Status: AC

## 2020-08-30 MED FILL — Lisinopril Tab 2.5 MG: ORAL | 30 days supply | Qty: 30 | Fill #2 | Status: AC

## 2020-09-02 ENCOUNTER — Other Ambulatory Visit: Payer: Self-pay

## 2020-09-23 ENCOUNTER — Encounter (INDEPENDENT_AMBULATORY_CARE_PROVIDER_SITE_OTHER): Payer: Self-pay

## 2020-09-23 ENCOUNTER — Other Ambulatory Visit: Payer: Self-pay

## 2020-09-23 ENCOUNTER — Ambulatory Visit: Payer: Self-pay | Attending: Family Medicine | Admitting: Family Medicine

## 2020-09-23 VITALS — BP 107/62 | HR 64 | Ht 62.0 in | Wt 144.6 lb

## 2020-09-23 DIAGNOSIS — E1165 Type 2 diabetes mellitus with hyperglycemia: Secondary | ICD-10-CM

## 2020-09-23 DIAGNOSIS — M19042 Primary osteoarthritis, left hand: Secondary | ICD-10-CM

## 2020-09-23 DIAGNOSIS — M19041 Primary osteoarthritis, right hand: Secondary | ICD-10-CM

## 2020-09-23 LAB — POCT GLYCOSYLATED HEMOGLOBIN (HGB A1C): HbA1c, POC (controlled diabetic range): 7.8 % — AB (ref 0.0–7.0)

## 2020-09-23 LAB — GLUCOSE, POCT (MANUAL RESULT ENTRY): POC Glucose: 48 mg/dl — AB (ref 70–99)

## 2020-09-23 MED ORDER — PREDNISONE 20 MG PO TABS
20.0000 mg | ORAL_TABLET | Freq: Every day | ORAL | 0 refills | Status: DC
  Filled 2020-09-23 – 2020-09-26 (×2): qty 5, 5d supply, fill #0

## 2020-09-23 MED ORDER — TRULICITY 0.75 MG/0.5ML ~~LOC~~ SOAJ
0.7500 mg | SUBCUTANEOUS | 6 refills | Status: DC
  Filled 2020-09-23 – 2020-09-26 (×2): qty 2, 28d supply, fill #0
  Filled 2020-10-18: qty 2, 28d supply, fill #1
  Filled 2020-11-25: qty 2, 28d supply, fill #2
  Filled 2020-12-29: qty 2, 28d supply, fill #3
  Filled 2021-02-04: qty 2, 28d supply, fill #4
  Filled 2021-03-11: qty 2, 28d supply, fill #5
  Filled 2021-03-12: qty 2, 28d supply, fill #0
  Filled 2021-04-25: qty 2, 28d supply, fill #1

## 2020-09-23 MED ORDER — MELOXICAM 7.5 MG PO TABS
7.5000 mg | ORAL_TABLET | Freq: Every day | ORAL | 1 refills | Status: DC
  Filled 2020-09-23 – 2020-09-26 (×2): qty 30, 30d supply, fill #0
  Filled 2020-10-18: qty 30, 30d supply, fill #1

## 2020-09-23 NOTE — Progress Notes (Signed)
Subjective:  Patient ID: Margaret Pace, female    DOB: 04/23/1965  Age: 55 y.o. MRN: [id number]  CC: Diabetes   HPI Margaret Pace is a 55 y.o. year old female with a history of type 2 diabetes mellitus (A1c 7.8), hypertension who presents today for follow-up visit.  Interval History: Complains of both hands hurting and this is worse at night with no associated numbness. Uses Naproxen and Tylenol with some relief but would like something stronger.  She has Trulicity on her med list but she has not been taking it as she ran out and never received a shipment from the company. Compliant with her other diabetic medications. Also doing well on her statin. Past Medical History:  Diagnosis Date   Anemia    Pt reported blood transfusion after left leg fracture.   Carpal tunnel syndrome, bilateral    Diabetes mellitus without complication [facility](HCC)     Past Surgical History:  Procedure Laterality Date   APPLICATION OF WOUND VAC Left 05/14/2014   Procedure: APPLICATION OF WOUND VAC;  Surgeon: Myrene GalasMichael Handy, MD;  [facility];  Service: Orthopedics;  Laterality: Left;   FASCIOTOMY Left 05/14/2014   Procedure: FOUR COMPARTMENT FASCIOTOMY;  Surgeon: Myrene GalasMichael Handy, MD;  [facility];  Service: Orthopedics;  Laterality: Left;   FRACTURE SURGERY     HARDWARE REMOVAL Left 10/19/2014   Procedure: HARDWARE REMOVAL LEFT TIBIA AND ANKLE;  Surgeon: Myrene GalasMichael Handy, MD;  [facility];  Service: Orthopedics;  Laterality: Left;   SECONDARY CLOSURE OF WOUND Left 05/17/2014   Procedure: WOUND CLOSURE LEFT LEG ;  Surgeon: Myrene GalasMichael Handy, MD;  [facility];  Service: Orthopedics;  Laterality: Left;   TIBIA IM NAIL INSERTION Left 05/14/2014   Procedure: INTRAMEDULLARY (IM) NAIL TIBIAL;  Surgeon: Myrene GalasMichael Handy, MD;  [facility];  Service: Orthopedics;  Laterality: Left;    Family History  Problem Relation Age of Onset   Diabetes Mother    Diabetes Sister    Diabetes Brother    Diabetes  Sister    Diabetes Sister    Diabetes Brother     No Known Allergies  Outpatient Medications Prior to Visit  Medication Sig Dispense Refill   atorvastatin (LIPITOR) 40 MG tablet TAKE 1 TABLET (40 MG TOTAL) BY MOUTH DAILY. 30 tablet 6   Blood Glucose Monitoring Suppl (TRUE METRIX METER) DEVI 1 each by Does not apply route 3 (three) times daily before meals. 1 Device 0   canagliflozin (INVOKANA) 100 MG TABS tablet TAKE 1 TABLET (100 MG TOTAL) BY MOUTH DAILY BEFORE BREAKFAST. 30 tablet 6   glipiZIDE (GLUCOTROL) 10 MG tablet TAKE 1 TABLET (10 MG TOTAL) BY MOUTH 2 (TWO) TIMES DAILY BEFORE A MEAL. 60 tablet 6   glucose blood test strip USE AS DIRECTED THREE TIMES DAILY 100 each 12   Insulin Pen Needle 31G X 5 MM MISC 1 each by Does not apply route at bedtime. 100 each 1   lidocaine (LIDODERM) 5 % Place 1 patch onto the skin daily. Remove & Discard patch within 12 hours or as directed by MD 30 patch 1   lisinopril (ZESTRIL) 2.5 MG tablet TAKE 1 TABLET (2.5 MG TOTAL) BY MOUTH DAILY. 30 tablet 6   metFORMIN (GLUCOPHAGE) 1000 MG tablet TAKE 1 TABLET (1,000 MG TOTAL) BY MOUTH 2 (TWO) TIMES DAILY WITH A MEAL. 60 tablet 6   methocarbamol (ROBAXIN) 500 MG tablet Take 1 tablet (500 mg total) by mouth every 8 (eight) hours as  needed for muscle spasms. 60 tablet 1   TRUEPLUS LANCETS 28G MISC 1 each by Does not apply route 3 (three) times daily before meals. 100 each 12   Dulaglutide (TRULICITY) 0.75 MG/0.5ML SOPN Inject 0.5 mLs (0.75 mg total) into the skin once a week. 1 pen 6   naproxen (NAPROSYN) 500 MG tablet TAKE 1 TABLET (500 MG TOTAL) BY MOUTH 2 (TWO) TIMES DAILY WITH A MEAL. 60 tablet 1   HYDROcodone-acetaminophen (NORCO/VICODIN) 5-325 MG tablet Take 1 tablet by mouth every 6 (six) hours as needed for severe pain. (Patient not taking: No sig reported) 15 tablet 0   ondansetron (ZOFRAN ODT) 4 MG disintegrating tablet 4mg  ODT q4 hours prn nausea/vomit (Patient not taking: No sig reported) 10 tablet 0    polyethylene glycol-electrolytes (TRILYTE) 420 g solution Take 4,000 mLs by mouth as directed. (Patient not taking: No sig reported) 4000 mL 0   valACYclovir (VALTREX) 1000 MG tablet Take 1 tablet (1,000 mg total) by mouth 3 (three) times daily. (Patient not taking: No sig reported) 21 tablet 0   atorvastatin (LIPITOR) 40 MG tablet Take 1 tablet (40 mg total) by mouth daily. 30 tablet 6   atorvastatin (LIPITOR) 40 MG tablet TAKE 1 TABLET (40 MG TOTAL) BY MOUTH DAILY. 30 tablet 6   canagliflozin (INVOKANA) 100 MG TABS tablet TAKE 1 TABLET (100 MG TOTAL) BY MOUTH DAILY BEFORE BREAKFAST. 30 tablet 6   canagliflozin (INVOKANA) 100 MG TABS tablet TAKE 1 TABLET (100 MG TOTAL) BY MOUTH DAILY BEFORE BREAKFAST. 30 tablet 6   glipiZIDE (GLUCOTROL) 10 MG tablet Take 1 tablet (10 mg total) by mouth 2 (two) times daily before a meal. 60 tablet 6   glipiZIDE (GLUCOTROL) 10 MG tablet TAKE 1 TABLET (10 MG TOTAL) BY MOUTH 2 (TWO) TIMES DAILY BEFORE A MEAL. 60 tablet 6   glipiZIDE (GLUCOTROL) 10 MG tablet TAKE 1 TABLET (10 MG TOTAL) BY MOUTH 2 (TWO) TIMES DAILY BEFORE A MEAL. 60 tablet 6   lisinopril (ZESTRIL) 2.5 MG tablet Take 1 tablet (2.5 mg total) by mouth daily. 30 tablet 6   lisinopril (ZESTRIL) 2.5 MG tablet TAKE 1 TABLET (2.5 MG TOTAL) BY MOUTH DAILY. 30 tablet 6   lisinopril (ZESTRIL) 2.5 MG tablet TAKE 1 TABLET (2.5 MG TOTAL) BY MOUTH DAILY. 30 tablet 6   metFORMIN (GLUCOPHAGE) 1000 MG tablet Take 1 tablet (1,000 mg total) by mouth 2 (two) times daily with a meal. 60 tablet 6   metFORMIN (GLUCOPHAGE) 1000 MG tablet TAKE 1 TABLET (1,000 MG TOTAL) BY MOUTH 2 (TWO) TIMES DAILY WITH A MEAL. 60 tablet 6   metFORMIN (GLUCOPHAGE) 1000 MG tablet TAKE 1 TABLET (1,000 MG TOTAL) BY MOUTH 2 (TWO) TIMES DAILY WITH A MEAL. 60 tablet 6   naproxen (NAPROSYN) 500 MG tablet TAKE 1 TABLET (500 MG TOTAL) BY MOUTH 2 (TWO) TIMES DAILY WITH A MEAL. 60 tablet 1   naproxen (NAPROSYN) 500 MG tablet TAKE 1 TABLET (500 MG TOTAL) BY  MOUTH 2 (TWO) TIMES DAILY WITH A MEAL. 60 tablet 1   No facility-administered medications prior to visit.     ROS Review of Systems  Constitutional:  Negative for activity change, appetite change and fatigue.  HENT:  Negative for congestion, sinus pressure and sore throat.   Eyes:  Negative for visual disturbance.  Respiratory:  Negative for cough, chest tightness, shortness of breath and wheezing.   Cardiovascular:  Negative for chest pain and palpitations.  Gastrointestinal:  Negative for abdominal distention, abdominal  pain and constipation.  Endocrine: Negative for polydipsia.  Genitourinary:  Negative for dysuria and frequency.  Musculoskeletal:        See HPI  Skin:  Negative for rash.  Neurological:  Negative for tremors, light-headedness and numbness.  Hematological:  Does not bruise/bleed easily.  Psychiatric/Behavioral:  Negative for agitation and behavioral problems.    Objective:  BP 107/62   Pulse 64   Ht 5\' 2"  (1.575 m)   Wt 144 lb 9.6 oz (65.6 kg)   LMP 11/12/2013   SpO2 100%   BMI 26.45 kg/m   BP/Weight 09/23/2020 11/29/2019 08/29/2019  Systolic BP 107 110 123  Diastolic BP 62 66 66  Wt. (Lbs) 144.6 149.2 155  BMI 26.45 27.29 28.35      Physical Exam Constitutional:      Appearance: She is well-developed.  Cardiovascular:     Rate and Rhythm: Normal rate.     Heart sounds: Normal heart sounds. No murmur heard. Pulmonary:     Effort: Pulmonary effort is normal.     Breath sounds: Normal breath sounds. No wheezing or rales.  Chest:     Chest wall: No tenderness.  Abdominal:     General: Bowel sounds are normal. There is no distension.     Palpations: Abdomen is soft. There is no mass.     Tenderness: There is no abdominal tenderness.  Musculoskeletal:        General: Normal range of motion.     Right lower leg: No edema.     Left lower leg: No edema.     Comments: Negative Tinel's, negative Phalen sign Able to make a fist bilaterally   Neurological:     Mental Status: She is alert and oriented to person, place, and time.  Psychiatric:        Mood and Affect: Mood normal.    CMP Latest Ref Rng & Units 04/22/2020 08/29/2019 05/30/2019  Glucose 65 - 99 mg/dL 75 79 06/01/2019)  BUN 6 - 24 mg/dL 16 21 13   Creatinine 0.57 - 1.00 mg/dL 161(W 9.60  Sodium 134 - 144 mmol/L 140 143 141  Potassium 3.5 - 5.2 mmol/L 5.1 4.9 4.6  Chloride 96 - 106 mmol/L 104 106 104  CO2 20 - 29 mmol/L 22 22 21   Calcium 8.7 - 10.2 mg/dL 9.6 9.9 9.8  Total Protein 6.0 - 8.5 g/dL 7.6 7.6 6.8  Total Bilirubin 0.0 - 1.2 mg/dL 0.3 0.3 0.4  Alkaline Phos 44 - 121 IU/L 126(H) 136(H) 140(H)  AST 0 - 40 IU/L 18 14 14   ALT 0 - 32 IU/L 11 10 10     Lipid Panel     Component Value Date/Time   CHOL 103 04/22/2020 1205   TRIG 186 (H) 04/22/2020 1205   HDL 36 (L) 04/22/2020 1205   CHOLHDL 2.9 04/22/2020 1205   CHOLHDL 3.3 04/14/2016 0928   VLDL 45 (H) 09/25/2015 1144   LDLCALC 37 04/22/2020 1205    CBC    Component Value Date/Time   WBC 9.5 12/20/2017 1403   RBC 4.38 12/20/2017 1403   HGB 12.6 12/20/2017 1403   HCT 40.3 12/20/2017 1403   PLT 212 12/20/2017 1403   MCV 92.0 12/20/2017 1403   MCH 28.8 12/20/2017 1403   MCHC 31.3 12/20/2017 1403   RDW 14.2 12/20/2017 1403   LYMPHSABS 0.6 (L) 12/20/2017 1403   MONOABS 0.6 12/20/2017 1403   EOSABS 0.0 12/20/2017 1403   BASOSABS 0.0 12/20/2017 1403    Lab  Results  Component Value Date   HGBA1C 7.8 (A) 09/23/2020    Assessment & Plan:  1. Type 2 diabetes mellitus with hyperglycemia, without long-term current use of insulin (HCC) Suboptimally controlled with A1c of 7.5; goal is less than 7.0 She has been out of Trulicity which could explain this I have refilled Trulicity and she will need to speak with the pharmacy regarding medication assistance program Counseled on Diabetic diet, my plate method, 833 minutes of moderate intensity exercise/week Blood sugar logs with fasting goals of 80-120  mg/dl, random of less than 825 and in the event of sugars less than 60 mg/dl or greater than 053 mg/dl encouraged to notify the clinic. Advised on the need for annual eye exams, annual foot exams, Pneumonia vaccine. - POCT glucose (manual entry) - POCT glycosylated hemoglobin (Hb A1C) - Dulaglutide (TRULICITY) 0.75 MG/0.5ML SOPN; Inject 0.75 mg into the skin once a week.  Dispense: 2 mL; Refill: 6  2. Primary osteoarthritis of both hands Uncontrolled Will place on short course of prednisone Substitute naproxen with meloxicam - predniSONE (DELTASONE) 20 MG tablet; Take 1 tablet (20 mg total) by mouth daily with breakfast.  Dispense: 5 tablet; Refill: 0 - meloxicam (MOBIC) 7.5 MG tablet; Take 1 tablet (7.5 mg total) by mouth daily.  Dispense: 30 tablet; Refill: 1   Meds ordered this encounter  Medications   Dulaglutide (TRULICITY) 0.75 MG/0.5ML SOPN    Sig: Inject 0.75 mg into the skin once a week.    Dispense:  2 mL    Refill:  6   predniSONE (DELTASONE) 20 MG tablet    Sig: Take 1 tablet (20 mg total) by mouth daily with breakfast.    Dispense:  5 tablet    Refill:  0   meloxicam (MOBIC) 7.5 MG tablet    Sig: Take 1 tablet (7.5 mg total) by mouth daily.    Dispense:  30 tablet    Refill:  1    Discontinue Naproxen    Follow-up: Return in about 3 months (around 12/24/2020) for Diabetes.       Hoy Register, MD, FAAFP. [facility] [place], [place] [phone]   09/23/2020, 5:30 PM

## 2020-09-24 ENCOUNTER — Other Ambulatory Visit: Payer: Self-pay

## 2020-09-26 ENCOUNTER — Other Ambulatory Visit: Payer: Self-pay

## 2020-10-10 ENCOUNTER — Other Ambulatory Visit: Payer: Self-pay

## 2020-10-18 ENCOUNTER — Other Ambulatory Visit: Payer: Self-pay

## 2020-10-18 ENCOUNTER — Other Ambulatory Visit: Payer: Self-pay | Admitting: Family Medicine

## 2020-10-18 DIAGNOSIS — M19041 Primary osteoarthritis, right hand: Secondary | ICD-10-CM

## 2020-10-18 MED FILL — Glipizide Tab 10 MG: ORAL | 30 days supply | Qty: 60 | Fill #3 | Status: AC

## 2020-10-18 MED FILL — Canagliflozin Tab 100 MG: ORAL | 30 days supply | Qty: 30 | Fill #3 | Status: AC

## 2020-10-18 MED FILL — Lisinopril Tab 2.5 MG: ORAL | 30 days supply | Qty: 30 | Fill #3 | Status: AC

## 2020-10-18 MED FILL — Atorvastatin Calcium Tab 40 MG (Base Equivalent): ORAL | 30 days supply | Qty: 30 | Fill #3 | Status: AC

## 2020-10-18 MED FILL — Metformin HCl Tab 1000 MG: ORAL | 30 days supply | Qty: 60 | Fill #3 | Status: AC

## 2020-10-18 NOTE — Telephone Encounter (Signed)
Requested medication (s) are due for refill today - unsure- short term Rx  Requested medication (s) are on the active medication list -yes  Future visit scheduled -yes  Last refill: 09/23/20  Notes to clinic: Request RF: non delegated Rx  Requested Prescriptions  Pending Prescriptions Disp Refills   predniSONE (DELTASONE) 20 MG tablet 5 tablet 0    Sig: Take 1 tablet (20 mg total) by mouth daily with breakfast.     Not Delegated - Endocrinology:  Oral Corticosteroids Failed - 10/18/2020 12:14 PM      Failed - This refill cannot be delegated      Passed - Last BP in normal range    BP Readings from Last 1 Encounters:  09/23/20 107/62          Passed - Valid encounter within last 6 months    Recent Outpatient Visits           3 weeks ago Type 2 diabetes mellitus with hyperglycemia, without long-term current use of insulin (HCC)   [place] [facility] [place], Odette Horns, MD   5 months ago Screening for colon cancer   [place] [facility] [place], [place], MD   10 months ago Type 2 diabetes mellitus with hyperglycemia, without long-term current use of insulin (HCC)   [place] [facility] [place], Elizabeth, MD   1 year ago Type 2 diabetes mellitus with hyperglycemia, without long-term current use of insulin (HCC)   [place] [facility] Margaret, [place], MD   1 year ago Type 2 diabetes mellitus with hyperglycemia, without long-term current use of insulin (HCC)   [place] [facility] [place], Odette Horns, MD       Future Appointments             In 2 months Hoy Register, MD [facility]               Requested Prescriptions  Pending Prescriptions Disp Refills   predniSONE (DELTASONE) 20 MG tablet 5 tablet 0    Sig: Take 1 tablet (20 mg total) by mouth daily with breakfast.     Not Delegated - Endocrinology:  Oral Corticosteroids Failed -  10/18/2020 12:14 PM      Failed - This refill cannot be delegated      Passed - Last BP in normal range    BP Readings from Last 1 Encounters:  09/23/20 107/62          Passed - Valid encounter within last 6 months    Recent Outpatient Visits           3 weeks ago Type 2 diabetes mellitus with hyperglycemia, without long-term current use of insulin (HCC)   [place] [facility] [place], Odette Horns, MD   5 months ago Screening for colon cancer   [place] [facility] [place], [place], MD   10 months ago Type 2 diabetes mellitus with hyperglycemia, without long-term current use of insulin (HCC)   [place] [facility] [place], [address], MD   1 year ago Type 2 diabetes mellitus with hyperglycemia, without long-term current use of insulin (HCC)   [place] [facility] [place], [place], MD   1 year ago Type 2 diabetes mellitus with hyperglycemia, without long-term current use of insulin (HCC)   [place] [facility] Hoy Register, MD       Future Appointments  In 2 months Hoy Register, MD [facility]

## 2020-10-22 ENCOUNTER — Other Ambulatory Visit: Payer: Self-pay

## 2020-10-23 ENCOUNTER — Other Ambulatory Visit: Payer: Self-pay

## 2020-11-25 ENCOUNTER — Other Ambulatory Visit: Payer: Self-pay | Admitting: Family Medicine

## 2020-11-25 DIAGNOSIS — M19041 Primary osteoarthritis, right hand: Secondary | ICD-10-CM

## 2020-11-25 MED FILL — Metformin HCl Tab 1000 MG: ORAL | 30 days supply | Qty: 60 | Fill #4 | Status: AC

## 2020-11-25 MED FILL — Glipizide Tab 10 MG: ORAL | 30 days supply | Qty: 60 | Fill #4 | Status: AC

## 2020-11-25 MED FILL — Atorvastatin Calcium Tab 40 MG (Base Equivalent): ORAL | 30 days supply | Qty: 30 | Fill #4 | Status: AC

## 2020-11-25 MED FILL — Lisinopril Tab 2.5 MG: ORAL | 30 days supply | Qty: 30 | Fill #4 | Status: AC

## 2020-11-25 MED FILL — Canagliflozin Tab 100 MG: ORAL | 30 days supply | Qty: 30 | Fill #4 | Status: AC

## 2020-11-26 ENCOUNTER — Other Ambulatory Visit: Payer: Self-pay

## 2020-11-26 NOTE — Telephone Encounter (Signed)
Requested medications are due for refill today yes  Requested medications are on the active medication list yes  Last refill 10/22/20  Last visit 09/23/20, last hgb 12/20/17  Future visit scheduled 12/24/20  Notes to clinic Failed protocol due to no valid labs within 360 days, please assess.  Requested Prescriptions  Pending Prescriptions Disp Refills   meloxicam (MOBIC) 7.5 MG tablet 30 tablet 1    Sig: Take 1 tablet (7.5 mg total) by mouth daily.     Analgesics:  COX2 Inhibitors Failed - 11/25/2020  8:36 PM      Failed - HGB in normal range and within 360 days    Hemoglobin  Date Value Ref Range Status  12/20/2017 12.6 12.0 - 15.0 g/dL Final          Passed - Cr in normal range and within 360 days    Creat  Date Value Ref Range Status  04/14/2016 0.76 0.50 - 1.05 mg/dL Final    Comment:      For patients > or = 54 years of age: The upper reference limit for Creatinine is approximately 13% higher for people identified as African-American.      Creatinine, Ser  Date Value Ref Range Status  04/22/2020 0.87 0.57 - 1.00 mg/dL Final   Creatinine, Urine  Date Value Ref Range Status  04/14/2016 75 20 - 320 mg/dL Final          Passed - Patient is not pregnant      Passed - Valid encounter within last 12 months    Recent Outpatient Visits           2 months ago Type 2 diabetes mellitus with hyperglycemia, without long-term current use of insulin (HCC)   [place] [facility] [place], Odette Horns, MD   7 months ago Screening for colon cancer   [place] [facility] [place], [place], MD   12 months ago Type 2 diabetes mellitus with hyperglycemia, without long-term current use of insulin (HCC)   [place] [facility] [place], Kaycee, MD   1 year ago Type 2 diabetes mellitus with hyperglycemia, without long-term current use of insulin (HCC)   [place] [facility] [place], [place], MD   1 year  ago Type 2 diabetes mellitus with hyperglycemia, without long-term current use of insulin (HCC)   [place] [facility] Hoy Register, MD       Future Appointments             In 4 weeks Hoy Register, MD [facility]

## 2020-11-27 ENCOUNTER — Other Ambulatory Visit: Payer: Self-pay

## 2020-11-27 MED ORDER — MELOXICAM 7.5 MG PO TABS
7.5000 mg | ORAL_TABLET | Freq: Every day | ORAL | 1 refills | Status: DC
  Filled 2020-11-27: qty 30, 30d supply, fill #0

## 2020-12-04 ENCOUNTER — Other Ambulatory Visit: Payer: Self-pay

## 2020-12-24 ENCOUNTER — Encounter: Payer: Self-pay | Admitting: Family Medicine

## 2020-12-24 ENCOUNTER — Other Ambulatory Visit: Payer: Self-pay

## 2020-12-24 ENCOUNTER — Ambulatory Visit: Payer: Self-pay | Attending: Family Medicine | Admitting: Family Medicine

## 2020-12-24 VITALS — BP 119/72 | HR 69 | Ht 62.0 in | Wt 144.8 lb

## 2020-12-24 DIAGNOSIS — E1169 Type 2 diabetes mellitus with other specified complication: Secondary | ICD-10-CM

## 2020-12-24 DIAGNOSIS — Z23 Encounter for immunization: Secondary | ICD-10-CM

## 2020-12-24 DIAGNOSIS — I10 Essential (primary) hypertension: Secondary | ICD-10-CM

## 2020-12-24 DIAGNOSIS — E1165 Type 2 diabetes mellitus with hyperglycemia: Secondary | ICD-10-CM

## 2020-12-24 LAB — POCT GLYCOSYLATED HEMOGLOBIN (HGB A1C): HbA1c, POC (controlled diabetic range): 7.3 % — AB (ref 0.0–7.0)

## 2020-12-24 LAB — GLUCOSE, POCT (MANUAL RESULT ENTRY): POC Glucose: 207 mg/dl — AB (ref 70–99)

## 2020-12-24 MED ORDER — LISINOPRIL 2.5 MG PO TABS
ORAL_TABLET | Freq: Every day | ORAL | 6 refills | Status: DC
  Filled 2020-12-24: qty 30, 30d supply, fill #0
  Filled 2021-02-04: qty 30, 30d supply, fill #1
  Filled 2021-03-11: qty 30, 30d supply, fill #2
  Filled 2021-03-12: qty 30, 30d supply, fill #0
  Filled 2021-04-25: qty 30, 30d supply, fill #1
  Filled 2021-05-23: qty 30, 30d supply, fill #2
  Filled 2021-06-30: qty 30, 30d supply, fill #3
  Filled 2021-07-29: qty 30, 30d supply, fill #4

## 2020-12-24 NOTE — Progress Notes (Signed)
Subjective:  Patient ID: Margaret Pace, female    DOB: [birth year]  Age: 55 y.o. MRN: [id number]  CC: Diabetes   HPI Margaret Pace is a 55 y.o. year old female with a history of type 2 diabetes mellitus (A1c 7.3), hypertension who presents today for follow-up visit  Interval History: A1c 7.3 down from 7.8 previously and she endorses compliance with Trulicity, metformin and glipizide, Invokana with no hypoglycemic episodes. Also doing well on her statin. She exercises regularly by attending yoga classes 1 hour every day of the week. Hypertension is controlled on lisinopril. She has no additional concerns today. Past Medical History:  Diagnosis Date   Anemia    Pt reported blood transfusion after left leg fracture.   Carpal tunnel syndrome, bilateral    Diabetes mellitus without complication [facility])     Past Surgical History:  Procedure Laterality Date   APPLICATION OF WOUND VAC Left 05/14/2014   Procedure: APPLICATION OF WOUND VAC;  Surgeon: Altamese Paraje, MD;  Location: [place];  Service: Orthopedics;  Laterality: Left;   FASCIOTOMY Left 05/14/2014   Procedure: FOUR COMPARTMENT FASCIOTOMY;  Surgeon: Altamese Athens, MD;  Location: [place];  Service: Orthopedics;  Laterality: Left;   FRACTURE SURGERY     HARDWARE REMOVAL Left 10/19/2014   Procedure: HARDWARE REMOVAL LEFT TIBIA AND ANKLE;  Surgeon: Altamese [place], MD;  Location: [place];  Service: Orthopedics;  Laterality: Left;   SECONDARY CLOSURE OF WOUND Left 05/17/2014   Procedure: WOUND CLOSURE LEFT LEG ;  Surgeon: Altamese Enderlin, MD;  Location: [place];  Service: Orthopedics;  Laterality: Left;   TIBIA IM NAIL INSERTION Left 05/14/2014   Procedure: INTRAMEDULLARY (IM) NAIL TIBIAL;  Surgeon: Altamese Chester, MD;  Location: [place];  Service: Orthopedics;  Laterality: Left;    Family History  Problem Relation Age of Onset   Diabetes Mother    Diabetes Sister    Diabetes Brother    Diabetes Sister    Diabetes Sister    Diabetes  Brother     No Known Allergies  Outpatient Medications Prior to Visit  Medication Sig Dispense Refill   atorvastatin (LIPITOR) 40 MG tablet TAKE 1 TABLET (40 MG TOTAL) BY MOUTH DAILY. 30 tablet 6   Blood Glucose Monitoring Suppl (TRUE METRIX METER) DEVI 1 each by Does not apply route 3 (three) times daily before meals. 1 Device 0   canagliflozin (INVOKANA) 100 MG TABS tablet TAKE 1 TABLET (100 MG TOTAL) BY MOUTH DAILY BEFORE BREAKFAST. 30 tablet 6   Dulaglutide (TRULICITY) A999333 0000000 SOPN Inject 0.75 mg into the skin once a week. 2 mL 6   glipiZIDE (GLUCOTROL) 10 MG tablet TAKE 1 TABLET (10 MG TOTAL) BY MOUTH 2 (TWO) TIMES DAILY BEFORE A MEAL. 60 tablet 6   glucose blood test strip USE AS DIRECTED THREE TIMES DAILY 100 each 12   Insulin Pen Needle 31G X 5 MM MISC 1 each by Does not apply route at bedtime. 100 each 1   lidocaine (LIDODERM) 5 % Place 1 patch onto the skin daily. Remove & Discard patch within 12 hours or as directed by MD 30 patch 1   meloxicam (MOBIC) 7.5 MG tablet Take 1 tablet (7.5 mg total) by mouth daily. 30 tablet 1   metFORMIN (GLUCOPHAGE) 1000 MG tablet TAKE 1 TABLET (1,000 MG TOTAL) BY MOUTH 2 (TWO) TIMES DAILY WITH A MEAL. 60 tablet 6   methocarbamol (ROBAXIN) 500 MG tablet Take 1 tablet (500 mg total) by mouth every  8 (eight) hours as needed for muscle spasms. 60 tablet 1   ondansetron (ZOFRAN ODT) 4 MG disintegrating tablet 4mg  ODT q4 hours prn nausea/vomit 10 tablet 0   polyethylene glycol-electrolytes (TRILYTE) 420 g solution Take 4,000 mLs by mouth as directed. 4000 mL 0   TRUEPLUS LANCETS 28G MISC 1 each by Does not apply route 3 (three) times daily before meals. 100 each 12   valACYclovir (VALTREX) 1000 MG tablet Take 1 tablet (1,000 mg total) by mouth 3 (three) times daily. 21 tablet 0   lisinopril (ZESTRIL) 2.5 MG tablet TAKE 1 TABLET (2.5 MG TOTAL) BY MOUTH DAILY. 30 tablet 6   predniSONE (DELTASONE) 20 MG tablet Take 1 tablet (20 mg total) by mouth daily  with breakfast. 5 tablet 0   HYDROcodone-acetaminophen (NORCO/VICODIN) 5-325 MG tablet Take 1 tablet by mouth every 6 (six) hours as needed for severe pain. (Patient not taking: No sig reported) 15 tablet 0   No facility-administered medications prior to visit.     ROS Review of Systems  Constitutional:  Negative for activity change, appetite change and fatigue.  HENT:  Negative for congestion, sinus pressure and sore throat.   Eyes:  Negative for visual disturbance.  Respiratory:  Negative for cough, chest tightness, shortness of breath and wheezing.   Cardiovascular:  Negative for chest pain and palpitations.  Gastrointestinal:  Negative for abdominal distention, abdominal pain and constipation.  Endocrine: Negative for polydipsia.  Genitourinary:  Negative for dysuria and frequency.  Musculoskeletal:  Negative for arthralgias and back pain.  Skin:  Negative for rash.  Neurological:  Negative for tremors, light-headedness and numbness.  Hematological:  Does not bruise/bleed easily.  Psychiatric/Behavioral:  Negative for agitation and behavioral problems.    Objective:  BP 119/72   Pulse 69   Ht 5\' 2"  (1.575 m)   Wt 144 lb 12.8 oz (65.7 kg)   LMP 11/12/2013   SpO2 100%   BMI 26.48 kg/m   BP/Weight 12/24/2020 09/23/2020 11/29/2019  Systolic BP 119 107 110  Diastolic BP 72 62 66  Wt. (Lbs) 144.8 144.6 149.2  BMI 26.48 26.45 27.29      Physical Exam Constitutional:      Appearance: She is well-developed.  Cardiovascular:     Rate and Rhythm: Normal rate.     Heart sounds: Normal heart sounds. No murmur heard. Pulmonary:     Effort: Pulmonary effort is normal.     Breath sounds: Normal breath sounds. No wheezing or rales.  Chest:     Chest wall: No tenderness.  Abdominal:     General: Bowel sounds are normal. There is no distension.     Palpations: Abdomen is soft. There is no mass.     Tenderness: There is no abdominal tenderness.  Musculoskeletal:         General: Normal range of motion.     Right lower leg: No edema.     Left lower leg: No edema.  Neurological:     Mental Status: She is alert and oriented to person, place, and time.  Psychiatric:        Mood and Affect: Mood normal.    CMP Latest Ref Rng & Units 04/22/2020 08/29/2019 05/30/2019  Glucose 65 - 99 mg/dL 75 79 08/31/2019)  BUN 6 - 24 mg/dL 16 21 13   Creatinine 0.57 - 1.00 mg/dL 06/01/2019 616(W  Sodium 134 - 144 mmol/L 140 143 141  Potassium 3.5 - 5.2 mmol/L 5.1 4.9 4.6  Chloride 96 -  106 mmol/L 104 106 104  CO2 20 - 29 mmol/L 22 22 21   Calcium 8.7 - 10.2 mg/dL 9.6 9.9 9.8  Total Protein 6.0 - 8.5 g/dL 7.6 7.6 6.8  Total Bilirubin 0.0 - 1.2 mg/dL 0.3 0.3 0.4  Alkaline Phos 44 - 121 IU/L 126(H) 136(H) 140(H)  AST 0 - 40 IU/L 18 14 14   ALT 0 - 32 IU/L 11 10 10     Lipid Panel     Component Value Date/Time   CHOL 103 04/22/2020 1205   TRIG 186 (H) 04/22/2020 1205   HDL 36 (L) 04/22/2020 1205   CHOLHDL 2.9 04/22/2020 1205   CHOLHDL 3.3 04/14/2016 0928   VLDL 45 (H) 09/25/2015 1144   LDLCALC 37 04/22/2020 1205    CBC    Component Value Date/Time   WBC 9.5 12/20/2017 1403   RBC 4.38 12/20/2017 1403   HGB 12.6 12/20/2017 1403   HCT 40.3 12/20/2017 1403   PLT 212 12/20/2017 1403   MCV 92.0 12/20/2017 1403   MCH 28.8 12/20/2017 1403   MCHC 31.3 12/20/2017 1403   RDW 14.2 12/20/2017 1403   LYMPHSABS 0.6 (L) 12/20/2017 1403   MONOABS 0.6 12/20/2017 1403   EOSABS 0.0 12/20/2017 1403   BASOSABS 0.0 12/20/2017 1403    Lab Results  Component Value Date   HGBA1C 7.3 (A) 12/24/2020    Assessment & Plan:  1. Type 2 diabetes mellitus with other specified complication, without long-term current use of insulin (HCC) Controlled with A1c of 7.3 Continue Trulicity, metformin, glipizide, Invokana Counseled on Diabetic diet, my plate method, [id number] minutes of moderate intensity exercise/week Blood sugar logs with fasting goals of 80-120 mg/dl, random of less than 180 and in the  event of sugars less than 60 mg/dl or greater than 400 mg/dl encouraged to notify the clinic. Advised on the need for annual eye exams, annual foot exams, Pneumonia vaccine. - POCT glucose (manual entry) - POCT glycosylated hemoglobin (Hb 123XX123) - Basic Metabolic Panel  2. Essential hypertension Controlled Counseled on blood pressure goal of less than 130/80, low-sodium, DASH diet, medication compliance, 150 minutes of moderate intensity exercise per week. Discussed medication compliance, adverse effects. - lisinopril (ZESTRIL) 2.5 MG tablet; TAKE 1 TABLET (2.5 MG TOTAL) BY MOUTH DAILY.  Dispense: 30 tablet; Refill: 6  3. Need for immunization against influenza - Flu Vaccine QUAD 30mo+IM (Fluarix, Fluzone & Alfiuria Quad PF)    Meds ordered this encounter  Medications   lisinopril (ZESTRIL) 2.5 MG tablet    Sig: TAKE 1 TABLET (2.5 MG TOTAL) BY MOUTH DAILY.    Dispense:  30 tablet    Refill:  6    Follow-up: Return in about 1 month (around 01/23/2021) for Complete physical exam.       Charlott Rakes, MD, FAAFP. [facility] [place], [place]   12/24/2020, 5:54 PM

## 2020-12-24 NOTE — Patient Instructions (Signed)
Dulaglutide Injection Qu es este medicamento? La DULAGLUTIDA controla los niveles de azcar en la sangre en personas con diabetes tipo 2. Se [place] con cambios en el estilo de vida tales como dieta y ejercicio fsico. Podra disminuir el riesgo de problemas que necesiten tratamiento en el hospital. Estos problemas incluyen ataque cardiaco o accidente cerebrovascular. Este medicamento puede ser utilizado para otros usos; si tiene alguna pregunta consulte con su proveedor de atencin mdica o con su farmacutico. MARCAS COMUNES: Trulicity Qu le debo informar a mi profesional de la salud antes de tomar este medicamento? Necesitan saber si usted presenta alguno de los [employer] o situaciones: tumores endocrinos (neoplasia endcrina mltiple tipo II) o si alguien en su familiar tuvo esos tumores enfermedad ocular, problemas de la visin antecedentes de pancreatitis enfermedad renal enfermedad heptica problemas estomacales o intestinales cncer de tiroides o si alguien en su familia tuvo cncer de tiroides una reaccin alrgica o inusual a la dulaglutida, a otros medicamentos, alimentos, colorantes o conservantes si est embarazada o buscando quedar embarazada si est amamantando a un beb Cmo debo utilizar este medicamento? Este medicamento se inyecta debajo de la piel. Le ensearn cmo prepararlo y administrarlo. selo segn las instrucciones en la etiqueta el mismo da todas las semanas. NO presione repetidamente el inyector. Siga usndolo a menos que su proveedor de Insurance risk surveyor indique dejar de [place]. Si [place] este medicamento con insulina, debe [employer] medicamento y la insulina por separado. No los mezcle. No se aplique una inyeccin al lado de la otra. Cambie (rote) los sitios de inyeccin con cada inyeccin. Este frmaco viene con INSTRUCCIONES DE USO. Pdale a su farmacutico que le indique cmo usar [employer]. Lea la informacin atentamente. Hable con su  farmacutico o su proveedor de atencin mdica si tiene alguna pregunta. Es importante que deseche las agujas y las jeringas usadas en un recipiente resistente a los pinchazos. No las deseche en la basura. Si no tiene un recipiente resistente a los pinchazos, llame a su farmacutico o proveedor de atencin de la salud para obtenerlo. Su farmacutico le dar una Gua del medicamento especial (MedGuide, nombre en ingls) con cada receta y en cada ocasin que la vuelva a surtir. Asegrese de leer esta informacin cada vez cuidadosamente. Hable con su proveedor de atencin mdica sobre el uso de este medicamento en nios. Puede requerir atencin especial. Sobredosis: Pngase en contacto inmediatamente con un centro toxicolgico o una sala de urgencia si usted cree que haya tomado demasiado medicamento. ATENCIN: [employer] es solo para usted. No comparta este medicamento con nadie. Qu sucede si me olvido de una dosis? Si olvida una dosis, adminstrela lo antes posible, a menos que hayan pasado ms de 3 [address]  [address]. Si han pasado ms de 3 das desde el momento de administracin habitual, omita la dosis que se olvid. Administre la prxima dosis a la hora habitual. Qu puede interactuar con este medicamento? otros medicamentos para la diabetes Muchos medicamentos pueden causar Probation officer de Banker. Estos incluyen: bebidas con alcohol medicamentos antivirales para el VIH o SIDA aspirina y medicamentos tipo aspirina ciertos medicamentos para la presin arterial, enfermedad cardiaca y frecuencia cardiaca irregular cromo diurticos hormonas femeninas, tales como estrgenos o progestinas, pldoras anticonceptivas fenofibrato gemfibrozil isoniazida lanreotida hormonas masculinas o esteroides anablicos IMAO, tales como Carbex, Eldepryl, Marplan, Nardil y [place] medicamentos para alergias, asma, resfros o tos medicamentos para la depresin, ansiedad o trastornos  psicticos medicamentos para Publishing copy de peso niacina [place],  medicamentos para el dolor y la inflamacin, tales como ibuprofeno o naproxeno octreotida pasireotida pentamidina fenitona probenecid antibiticos del grupo de las quinolonas, tales como ciprofloxacino, levofloxacino y ofloxacino algunos suplementos dietticos a base de hierbas medicamentos esteroideos, tales como la prednisona o la cortisona sulfametoxasol; trimetoprima hormonas tiroideas Algunos medicamentos pueden ocultar los sntomas de advertencia de niveles bajos de azcar en la sangre (hipoglucemia). Es posible que deba monitorear ms atentamente su nivel de azcar en la sangre si est tomando uno de estos medicamentos. Estos medicamentos incluyen: betabloqueadores, que con frecuencia se usan para la presin arterial alta o problemas cardiacos (algunos ejemplos son atenolol, metoprolol y propranolol) clonidina guanetidina reserpina Puede ser que esta lista no menciona todas las posibles interacciones. Informe a su profesional de [employer] de [employer] productos a base de hierbas, medicamentos de [place] o suplementos nutritivos que est tomando. Si usted fuma, consume bebidas alcohlicas o si utiliza drogas ilegales, indqueselo tambin a su profesional de [employer]. Algunas sustancias pueden interactuar con su medicamento. A qu debo estar atento al usar [employer]? Visite a su proveedor de atencin mdica para que revise regularmente su evolucin. Consulte con su proveedor de atencin mdica si tiene diarrea grave, nuseas y vmitos, o sudoracin intensa. La prdida de demasiado lquido corporal puede hacer que sea peligroso usar [employer]. Se monitorizar una prueba llamada Hemoglobina A1C (A1C). Es un anlisis de sangre sencillo. Mide su control del nivel de azcar en la sangre durante los ltimos 2 a 3 meses. Se le realizar esta prueba cada 3 a 6 meses. Aprenda a revisar su nivel de azcar en la  sangre. Conozca los sntomas del nivel bajo y alto de International aid/development worker en la sangre y cmo controlarlos. Siempre lleve con usted una fuente rpida de azcar por si tiene sntomas de nivel bajo de azcar [employer]. Algunos ejemplos incluyen caramelos duros de azcar o tabletas de glucosa. Asegrese de que otras personas sepan que usted se puede ahogar si come o bebe cuando presenta sntomas graves de [place] bajo de azcar en la sangre, como convulsiones o prdida del conocimiento. Debe obtener ayuda mdica de inmediato. Informe a su proveedor de atencin mdica si tiene niveles altos de Banker. Es posible que deba cambiar la dosis de su medicamento. Si est enfermo o hace ms ejercicio que lo habitual, es posible que necesite cambiar la dosis de su medicamento. No saltee comidas. Pregunte a su proveedor de atencin mdica si debe evitar el alcohol. Muchos productos de venta libre para la tos y el resfro contienen azcar o alcohol. Estos pueden afectar los niveles de Banker. Los inyectores nunca deben compartirse. Incluso si se cambia la aguja, al compartir se pueden contagiar virus como la hepatitis o el VIH. Use un brazalete o una cadena de identificacin mdica. Lleve consigo una tarjeta que describa su afeccin. Incluya en la tarjeta una lista de los medicamentos y las dosis que [place]. Qu efectos secundarios puedo tener al [employer] este medicamento? Efectos secundarios que debe informar a su mdico o a Producer, television/film/video de la salud tan pronto como sea posible: Therapist, art (erupcin cutnea, comezn/picazn o urticaria; hinchazn de la cara, los labios o la lengua) cambios en la visin diarrea que contina o es grave infeccin (fiebre, escalofros, tos, Engineer, mining de Advertising copywriter, Engineer, mining o dificultad para Geographical information systems officer) lesin renal (dificultad para orinar o cambios en la cantidad de orina) niveles bajos de azcar en la sangre (ansiedad; confusin; mareos; aumento del apetito;  debilidad o  cansancio inusuales; aumento de la sudoracin; temblores; piel fra y sudorosa; irritabilidad; dolor de cabeza; visin borrosa; frecuencia cardiaca rpida; prdida del conocimiento) bulto o hinchazn en el cuello problemas para respirar problemas para tragar dolor o malestar estomacal inusual vmito Efectos secundarios que generalmente no requieren atencin mdica (infrmelos a su mdico o a su profesional de la salud si persisten o si son molestos): falta o prdida del apetito nuseas dolor, enrojecimiento o Marketing executive de la inyeccin Puede ser que esta lista no menciona todos los posibles efectos secundarios. Comunquese a su mdico por asesoramiento mdico [employer]. Usted puede informar los efectos secundarios a la FDA por telfono al 1-800-FDA-1088. Dnde debo guardar mi medicina? Mantenga fuera del alcance de nios y Neurosurgeon. Refrigeracin (preferido): Guarde los inyectores sin abrir en el refrigerador a una temperatura de [place] 2 y 8 grados Celsius (entre 36 y 46 grados Fahrenheit). Mantenga en el envase original hasta que est listo para usarlo. No congele ni utilice si el medicamento ha [employer]. Proteja de Statistician. Deseche todo el medicamento que no haya utilizado despus de la fecha de vencimiento en la etiqueta. Temperatura ambiente: Probation officer se puede guardar a Marketing executive ambiente inferior a 30 grados Celsius (86 grados Fahrenheit) por hasta un total de [address], si fuera necesario. Proteja de Statistician. Evite exponer al calor extremo. Si se almacena a Marketing executive ambiente, deseche todo el medicamento sin usar despus de [address] o despus de la fecha de vencimiento, lo que suceda primero. Para desechar el medicamento que ya no necesite o que est vencido: Lleve el medicamento a un programa de recuperacin de medicamentos. Consulte con su farmacia o con una entidad reguladora para encontrar un lugar donde llevarlo. Si no puede Multimedia programmer, pregntele a Film/video editor o proveedor de atencin mdica cmo desecharlo de IT consultant. ATENCIN: Este folleto es un resumen. Puede ser que no cubra toda la posible informacin. Si usted tiene preguntas acerca de esta medicina, consulte con su mdico, su farmacutico o su profesional de Radiographer, therapeutic.  2022 Elsevier/Gold Standard (2020-08-28 00:00:00)

## 2020-12-25 ENCOUNTER — Ambulatory Visit: Payer: Self-pay | Attending: Family Medicine

## 2020-12-25 ENCOUNTER — Other Ambulatory Visit: Payer: Self-pay

## 2020-12-26 LAB — BASIC METABOLIC PANEL
BUN/Creatinine Ratio: 21 (ref 9–23)
BUN: 16 mg/dL (ref 6–24)
CO2: 22 mmol/L (ref 20–29)
Calcium: 9.7 mg/dL (ref 8.7–10.2)
Chloride: 106 mmol/L (ref 96–106)
Creatinine, Ser: 0.77 mg/dL (ref 0.57–1.00)
Glucose: 115 mg/dL — ABNORMAL HIGH (ref 70–99)
Potassium: 4.7 mmol/L (ref 3.5–5.2)
Sodium: 142 mmol/L (ref 134–144)
eGFR: 92 mL/min/{1.73_m2} (ref >59)

## 2020-12-27 ENCOUNTER — Telehealth: Payer: Self-pay

## 2020-12-27 NOTE — Telephone Encounter (Signed)
-----   Message from Hoy Register, MD sent at 12/26/2020 12:13 PM EST ----- Please inform the patient that labs are normal. Thank you.

## 2020-12-27 NOTE — Telephone Encounter (Signed)
Patient name and DOB has been verified Patient was informed of lab results. Patient had no questions.  

## 2020-12-29 ENCOUNTER — Other Ambulatory Visit: Payer: Self-pay | Admitting: Family Medicine

## 2020-12-29 MED FILL — Canagliflozin Tab 100 MG: ORAL | 30 days supply | Qty: 30 | Fill #5 | Status: AC

## 2020-12-29 NOTE — Telephone Encounter (Signed)
All 3 requested meds have expired. Atorvastatin, glipizide, metformin ordered 11/29/19 and all three meds expired 12/27/20. Routing to [facility].  Requested Prescriptions  Pending Prescriptions Disp Refills   atorvastatin (LIPITOR) 40 MG tablet 30 tablet 6    Sig: TAKE 1 TABLET (40 MG TOTAL) BY MOUTH DAILY.     Cardiovascular:  Antilipid - Statins Failed - 12/29/2020  1:26 PM      Failed - HDL in normal range and within 360 days    HDL  Date Value Ref Range Status  04/22/2020 36 (L) >39 mg/dL Final          Failed - Triglycerides in normal range and within 360 days    Triglycerides  Date Value Ref Range Status  04/22/2020 186 (H) 0 - 149 mg/dL Final          Passed - Total Cholesterol in normal range and within 360 days    Cholesterol, Total  Date Value Ref Range Status  04/22/2020 103 100 - 199 mg/dL Final          Passed - LDL in normal range and within 360 days    LDL Chol Calc (NIH)  Date Value Ref Range Status  04/22/2020 37 0 - 99 mg/dL Final          Passed - Patient is not pregnant      Passed - Valid encounter within last 12 months    Recent Outpatient Visits           5 days ago Type 2 diabetes mellitus with other specified complication, without long-term current use of insulin ([place])   [place], [place], MD   3 months ago Type 2 diabetes mellitus with hyperglycemia, without long-term current use of insulin ([place])   [place], Charlane Ferretti, MD   8 months ago Screening for colon cancer   [place], [place], MD   1 year ago Type 2 diabetes mellitus with hyperglycemia, without long-term current use of insulin ([place])   [place], [place], MD   1 year ago Type 2 diabetes mellitus with hyperglycemia, without long-term current use of insulin ([place])   [place], Charlane Ferretti, MD        Future Appointments             In 4 weeks Charlott Rakes, MD [place]             glipiZIDE (GLUCOTROL) 10 MG tablet 60 tablet 6    Sig: TAKE 1 TABLET (10 MG TOTAL) BY MOUTH 2 (TWO) TIMES DAILY BEFORE A MEAL.     Endocrinology:  Diabetes - Sulfonylureas Passed - 12/29/2020  1:26 PM      Passed - HBA1C is between 0 and 7.9 and within 180 days    HbA1c, POC (controlled diabetic range)  Date Value Ref Range Status  12/24/2020 7.3 (A) 0.0 - 7.0 % Final          Passed - Valid encounter within last 6 months    Recent Outpatient Visits           5 days ago Type 2 diabetes mellitus with other specified complication, without long-term current use of insulin ([place])   [place], Charlane Ferretti, MD   3 months ago Type 2 diabetes mellitus with hyperglycemia, without long-term current use of  insulin [facility])   [place] Charlott Rakes, MD   8 months ago Screening for colon cancer   [place], [place], MD   1 year ago Type 2 diabetes mellitus with hyperglycemia, without long-term current use of insulin ([place])   [place], [place], MD   1 year ago Type 2 diabetes mellitus with hyperglycemia, without long-term current use of insulin ([place])   [place], Enobong, MD       Future Appointments             In 4 weeks Charlott Rakes, MD [place]             metFORMIN (GLUCOPHAGE) 1000 MG tablet 60 tablet 6    Sig: TAKE 1 TABLET (1,000 MG TOTAL) BY MOUTH 2 (TWO) TIMES DAILY WITH A MEAL.     Endocrinology:  Diabetes - Biguanides Passed - 12/29/2020  1:26 PM      Passed - Cr in normal range and within 360 days    Creat  Date Value Ref Range Status  04/14/2016 0.76 0.50 - 1.05 mg/dL Final    Comment:      For patients > or = 54 years of  age: The upper reference limit for Creatinine is approximately 13% higher for people identified as African-American.      Creatinine, Ser  Date Value Ref Range Status  12/25/2020 0.77 0.57 - 1.00 mg/dL Final   Creatinine, Urine  Date Value Ref Range Status  04/14/2016 75 20 - 320 mg/dL Final          Passed - HBA1C is between 0 and 7.9 and within 180 days    HbA1c, POC (controlled diabetic range)  Date Value Ref Range Status  12/24/2020 7.3 (A) 0.0 - 7.0 % Final          Passed - eGFR in normal range and within 360 days    GFR, Est African American  Date Value Ref Range Status  04/14/2016 >89 >=60 mL/min Final   GFR calc Af Amer  Date Value Ref Range Status  08/29/2019 93 >59 mL/min/1.73 Final    Comment:    **[facility] currently reports eGFR in compliance with the current**   recommendations of the [employer]. [facility] will   update reporting as new guidelines are published from the NKF-ASN   Task force.    GFR, Est Non African American  Date Value Ref Range Status  04/14/2016 >89 >=60 mL/min Final   GFR calc non Af Amer  Date Value Ref Range Status  08/29/2019 81 >59 mL/min/1.73 Final   eGFR  Date Value Ref Range Status  12/25/2020 92 >59 mL/min/1.73 Final          Passed - Valid encounter within last 6 months    Recent Outpatient Visits           5 days ago Type 2 diabetes mellitus with other specified complication, without long-term current use of insulin ([place])   [place], [place], MD   3 months ago Type 2 diabetes mellitus with hyperglycemia, without long-term current use of insulin ([place])   [place], Charlane Ferretti, MD   8 months ago Screening for colon cancer   [place], Charlane Ferretti, MD   1 year ago Type 2 diabetes mellitus with  hyperglycemia, without long-term current use of insulin ([place])   [place], [place], MD   1 year ago Type 2 diabetes mellitus with hyperglycemia, without long-term current use of insulin [facility])   [place], Enobong, MD       Future Appointments             In 4 weeks Charlott Rakes, MD [place]

## 2020-12-30 ENCOUNTER — Other Ambulatory Visit: Payer: Self-pay

## 2020-12-30 MED ORDER — ATORVASTATIN CALCIUM 40 MG PO TABS
ORAL_TABLET | Freq: Every day | ORAL | 3 refills | Status: DC
  Filled 2020-12-30: qty 30, 30d supply, fill #0
  Filled 2021-02-04: qty 30, 30d supply, fill #1
  Filled 2021-03-11: qty 30, 30d supply, fill #2
  Filled 2021-03-12: qty 30, 30d supply, fill #0
  Filled 2021-04-25: qty 30, 30d supply, fill #1

## 2020-12-30 MED ORDER — METFORMIN HCL 1000 MG PO TABS
ORAL_TABLET | Freq: Two times a day (BID) | ORAL | 3 refills | Status: DC
  Filled 2020-12-30 (×3): qty 60, 30d supply, fill #0
  Filled 2021-02-04: qty 60, 30d supply, fill #1
  Filled 2021-03-11: qty 60, 30d supply, fill #2
  Filled 2021-03-12: qty 60, 30d supply, fill #0
  Filled 2021-04-25: qty 60, 30d supply, fill #1

## 2020-12-30 MED ORDER — GLIPIZIDE 10 MG PO TABS
ORAL_TABLET | Freq: Two times a day (BID) | ORAL | 3 refills | Status: DC
  Filled 2020-12-30: qty 60, 30d supply, fill #0
  Filled 2021-02-04: qty 60, 30d supply, fill #1
  Filled 2021-03-11: qty 60, 30d supply, fill #2
  Filled 2021-03-12: qty 60, 30d supply, fill #0
  Filled 2021-04-25: qty 60, 30d supply, fill #1

## 2020-12-31 ENCOUNTER — Other Ambulatory Visit: Payer: Self-pay

## 2021-01-27 ENCOUNTER — Encounter: Payer: Self-pay | Admitting: Family Medicine

## 2021-02-04 ENCOUNTER — Other Ambulatory Visit: Payer: Self-pay

## 2021-02-04 MED FILL — Canagliflozin Tab 100 MG: ORAL | 30 days supply | Qty: 30 | Fill #6 | Status: AC

## 2021-02-05 ENCOUNTER — Other Ambulatory Visit: Payer: Self-pay

## 2021-02-26 ENCOUNTER — Ambulatory Visit: Payer: Self-pay | Admitting: Family Medicine

## 2021-03-11 ENCOUNTER — Other Ambulatory Visit: Payer: Self-pay | Admitting: Family Medicine

## 2021-03-12 ENCOUNTER — Other Ambulatory Visit: Payer: Self-pay

## 2021-03-12 ENCOUNTER — Encounter: Payer: Self-pay | Admitting: Family Medicine

## 2021-03-12 ENCOUNTER — Other Ambulatory Visit (HOSPITAL_COMMUNITY)
Admission: RE | Admit: 2021-03-12 | Discharge: 2021-03-12 | Disposition: A | Discharge status: Home / Self Care | Payer: Self-pay | Source: Ambulatory Visit | Attending: Family Medicine | Admitting: Family Medicine

## 2021-03-12 ENCOUNTER — Ambulatory Visit: Payer: Self-pay | Attending: Family Medicine | Admitting: Family Medicine

## 2021-03-12 ENCOUNTER — Other Ambulatory Visit: Payer: Self-pay | Admitting: Family Medicine

## 2021-03-12 VITALS — BP 131/75 | HR 60 | Ht 62.0 in | Wt 141.2 lb

## 2021-03-12 DIAGNOSIS — Z124 Encounter for screening for malignant neoplasm of cervix: Secondary | ICD-10-CM

## 2021-03-12 DIAGNOSIS — R051 Acute cough: Secondary | ICD-10-CM

## 2021-03-12 DIAGNOSIS — Z1231 Encounter for screening mammogram for malignant neoplasm of breast: Secondary | ICD-10-CM

## 2021-03-12 DIAGNOSIS — Z0001 Encounter for general adult medical examination with abnormal findings: Secondary | ICD-10-CM

## 2021-03-12 DIAGNOSIS — Z0000 Encounter for general adult medical examination without abnormal findings: Secondary | ICD-10-CM

## 2021-03-12 DIAGNOSIS — Z1211 Encounter for screening for malignant neoplasm of colon: Secondary | ICD-10-CM

## 2021-03-12 MED ORDER — BENZONATATE 100 MG PO CAPS
100.0000 mg | ORAL_CAPSULE | Freq: Two times a day (BID) | ORAL | 0 refills | Status: DC | PRN
  Filled 2021-03-12: qty 20, 10d supply, fill #0

## 2021-03-12 MED ORDER — CANAGLIFLOZIN 100 MG PO TABS
ORAL_TABLET | Freq: Every day | ORAL | 0 refills | Status: DC
  Filled 2021-03-12: qty 30, 30d supply, fill #0

## 2021-03-12 NOTE — Telephone Encounter (Signed)
Requested medication (s) are due for refill today: yes  Requested medication (s) are on the active medication list: yes  Last refill:  02/05/21  Future visit scheduled: TODAY 03/12/21  Notes to clinic:  this rx should have run out in Sept, question if taking correctly. Has appt today.   Requested Prescriptions  Pending Prescriptions Disp Refills   canagliflozin (INVOKANA) 100 MG TABS tablet 30 tablet 6    Sig: TAKE 1 TABLET (100 MG TOTAL) BY MOUTH DAILY BEFORE BREAKFAST.     Endocrinology: Diabetes - SGLT2 Inhibitors - canagliflozin Passed - 03/11/2021  8:37 PM      Passed - HBA1C is between 0 and 7.9 and within 180 days    HbA1c, POC (controlled diabetic range)  Date Value Ref Range Status  12/24/2020 7.3 (A) 0.0 - 7.0 % Final          Passed - Cr in normal range and within 360 days    Creat  Date Value Ref Range Status  04/14/2016 0.76 0.50 - 1.05 mg/dL Final    Comment:      For patients > or = 55 years of age: The upper reference limit for Creatinine is approximately 13% higher for people identified as African-American.      Creatinine, Ser  Date Value Ref Range Status  12/25/2020 0.77 0.57 - 1.00 mg/dL Final   Creatinine, Urine  Date Value Ref Range Status  04/14/2016 75 20 - 320 mg/dL Final          Passed - eGFR in normal range and within 360 days    GFR, Est African American  Date Value Ref Range Status  04/14/2016 >89 >=60 mL/min Final   GFR calc Af Amer  Date Value Ref Range Status  08/29/2019 93 >59 mL/min/1.73 Final    Comment:    **[facility] currently reports eGFR in compliance with the current**   recommendations of the [employer]. [facility] will   update reporting as new guidelines are published from the NKF-ASN   Task force.    GFR, Est Non African American  Date Value Ref Range Status  04/14/2016 >89 >=60 mL/min Final   GFR calc non Af Amer  Date Value Ref Range Status  08/29/2019 81 >59 mL/min/1.73 Final   eGFR  Date  Value Ref Range Status  12/25/2020 92 >59 mL/min/1.73 Final          Passed - Valid encounter within last 6 months    Recent Outpatient Visits           2 months ago Type 2 diabetes mellitus with other specified complication, without long-term current use of insulin ([place])   [place], [place], MD   5 months ago Type 2 diabetes mellitus with hyperglycemia, without long-term current use of insulin ([place])   [place], Charlane Ferretti, MD   10 months ago Screening for colon cancer   [place], [place], MD   1 year ago Type 2 diabetes mellitus with hyperglycemia, without long-term current use of insulin ([place])   [place], Charlane Ferretti, MD   1 year ago Type 2 diabetes mellitus with hyperglycemia, without long-term current use of insulin ([place])   [place], Charlane Ferretti, MD       Future Appointments             Today Charlott Rakes, MD [place]

## 2021-03-12 NOTE — Patient Instructions (Signed)

## 2021-03-12 NOTE — Telephone Encounter (Signed)
Requested Prescriptions  Pending Prescriptions Disp Refills   canagliflozin (INVOKANA) 100 MG TABS tablet 90 tablet 0    Sig: TAKE 1 TABLET (100 MG TOTAL) BY MOUTH DAILY BEFORE BREAKFAST.     Endocrinology: Diabetes - SGLT2 Inhibitors - canagliflozin Passed - 03/12/2021 10:55 AM      Passed - HBA1C is between 0 and 7.9 and within 180 days    HbA1c, POC (controlled diabetic range)  Date Value Ref Range Status  12/24/2020 7.3 (A) 0.0 - 7.0 % Final         Passed - Cr in normal range and within 360 days    Creat  Date Value Ref Range Status  04/14/2016 0.76 0.50 - 1.05 mg/dL Final    Comment:      For patients > or = 55 years of age: The upper reference limit for Creatinine is approximately 13% higher for people identified as African-American.      Creatinine, Ser  Date Value Ref Range Status  12/25/2020 0.77 0.57 - 1.00 mg/dL Final   Creatinine, Urine  Date Value Ref Range Status  04/14/2016 75 20 - 320 mg/dL Final         Passed - eGFR in normal range and within 360 days    GFR, Est African American  Date Value Ref Range Status  04/14/2016 >89 >=60 mL/min Final   GFR calc Af Amer  Date Value Ref Range Status  08/29/2019 93 >59 mL/min/1.73 Final    Comment:    **[facility] currently reports eGFR in compliance with the current**   recommendations of the [employer]. [facility] will   update reporting as new guidelines are published from the NKF-ASN   Task force.    GFR, Est Non African American  Date Value Ref Range Status  04/14/2016 >89 >=60 mL/min Final   GFR calc non Af Amer  Date Value Ref Range Status  08/29/2019 81 >59 mL/min/1.73 Final   eGFR  Date Value Ref Range Status  12/25/2020 92 >59 mL/min/1.73 Final         Passed - Valid encounter within last 6 months    Recent Outpatient Visits          Today Annual physical exam   [place], Charlane Ferretti, MD   2 months ago Type 2 diabetes mellitus with other  specified complication, without long-term current use of insulin ([place])   [place], [place], MD   5 months ago Type 2 diabetes mellitus with hyperglycemia, without long-term current use of insulin ([place])   [place], Enobong, MD   10 months ago Screening for colon cancer   [place], Charlane Ferretti, MD   1 year ago Type 2 diabetes mellitus with hyperglycemia, without long-term current use of insulin ([place])   [place], Enobong, MD      Future Appointments            In 3 months Charlott Rakes, MD [place]

## 2021-03-12 NOTE — Progress Notes (Signed)
Physical/Pap 

## 2021-03-12 NOTE — Progress Notes (Signed)
Subjective:  Patient ID: Margaret Pace, female    DOB: 02/25/1965  Age: 56 y.o. MRN: [id number]  CC: Annual Exam and Gynecologic Exam   HPI Margaret Pace is a 56 y.o. year old female with a history of type 2 diabetes mellitus (A1c 7.3), hypertension here for  a gynecological exam.  Interval History: She is due for a Pap smear, colonoscopy, mammogram  She has had a cough for the last 4 days. She has no post nasal drip, sinus pressure, myalgia, fever, dyspnea. She exercises by means of attending Zumba classes. Past Medical History:  Diagnosis Date   Anemia    Pt reported blood transfusion after left leg fracture.   Carpal tunnel syndrome, bilateral    Diabetes mellitus without complication [facility])     Past Surgical History:  Procedure Laterality Date   APPLICATION OF WOUND VAC Left 05/14/2014   Procedure: APPLICATION OF WOUND VAC;  Surgeon: Altamese [place], MD;  Location: [place];  Service: Orthopedics;  Laterality: Left;   FASCIOTOMY Left 05/14/2014   Procedure: FOUR COMPARTMENT FASCIOTOMY;  Surgeon: Altamese [place], MD;  Location: [place];  Service: Orthopedics;  Laterality: Left;   FRACTURE SURGERY     HARDWARE REMOVAL Left 10/19/2014   Procedure: HARDWARE REMOVAL LEFT TIBIA AND ANKLE;  Surgeon: Altamese Cutler, MD;  Location: [place];  Service: Orthopedics;  Laterality: Left;   SECONDARY CLOSURE OF WOUND Left 05/17/2014   Procedure: WOUND CLOSURE LEFT LEG ;  Surgeon: Altamese Rogers, MD;  Location: [place];  Service: Orthopedics;  Laterality: Left;   TIBIA IM NAIL INSERTION Left 05/14/2014   Procedure: INTRAMEDULLARY (IM) NAIL TIBIAL;  Surgeon: Altamese Culberson, MD;  Location: [place];  Service: Orthopedics;  Laterality: Left;    Family History  Problem Relation Age of Onset   Diabetes Mother    Diabetes Sister    Diabetes Brother    Diabetes Sister    Diabetes Sister    Diabetes Brother     No Known Allergies  Outpatient Medications Prior to Visit  Medication Sig Dispense Refill    atorvastatin (LIPITOR) 40 MG tablet TAKE 1 TABLET (40 MG TOTAL) BY MOUTH DAILY. 30 tablet 3   Blood Glucose Monitoring Suppl (TRUE METRIX METER) DEVI 1 each by Does not apply route 3 (three) times daily before meals. 1 Device 0   canagliflozin (INVOKANA) 100 MG TABS tablet TAKE 1 TABLET (100 MG TOTAL) BY MOUTH DAILY BEFORE BREAKFAST. 30 tablet 6   Dulaglutide (TRULICITY) A999333 0000000 SOPN Inject 0.75 mg into the skin once a week. 2 mL 6   glipiZIDE (GLUCOTROL) 10 MG tablet TAKE 1 TABLET (10 MG TOTAL) BY MOUTH 2 (TWO) TIMES DAILY BEFORE A MEAL. 60 tablet 3   glucose blood test strip USE AS DIRECTED THREE TIMES DAILY 100 each 12   HYDROcodone-acetaminophen (NORCO/VICODIN) 5-325 MG tablet Take 1 tablet by mouth every 6 (six) hours as needed for severe pain. 15 tablet 0   Insulin Pen Needle 31G X 5 MM MISC 1 each by Does not apply route at bedtime. 100 each 1   lidocaine (LIDODERM) 5 % Place 1 patch onto the skin daily. Remove & Discard patch within 12 hours or as directed by MD 30 patch 1   lisinopril (ZESTRIL) 2.5 MG tablet TAKE 1 TABLET (2.5 MG TOTAL) BY MOUTH DAILY. 30 tablet 6   meloxicam (MOBIC) 7.5 MG tablet Take 1 tablet (7.5 mg total) by mouth daily. 30 tablet 1   metFORMIN (GLUCOPHAGE) 1000 MG  tablet TAKE 1 TABLET (1,000 MG TOTAL) BY MOUTH 2 (TWO) TIMES DAILY WITH A MEAL. 60 tablet 3   methocarbamol (ROBAXIN) 500 MG tablet Take 1 tablet (500 mg total) by mouth every 8 (eight) hours as needed for muscle spasms. 60 tablet 1   ondansetron (ZOFRAN ODT) 4 MG disintegrating tablet 4mg  ODT q4 hours prn nausea/vomit 10 tablet 0   polyethylene glycol-electrolytes (TRILYTE) 420 g solution Take 4,000 mLs by mouth as directed. 4000 mL 0   TRUEPLUS LANCETS 28G MISC 1 each by Does not apply route 3 (three) times daily before meals. 100 each 12   valACYclovir (VALTREX) 1000 MG tablet Take 1 tablet (1,000 mg total) by mouth 3 (three) times daily. 21 tablet 0   No facility-administered medications prior  to visit.     ROS Review of Systems  Constitutional:  Negative for activity change, appetite change and fatigue.  HENT:  Negative for congestion, sinus pressure and sore throat.   Eyes:  Negative for visual disturbance.  Respiratory:  Positive for cough. Negative for chest tightness, shortness of breath and wheezing.   Cardiovascular:  Negative for chest pain and palpitations.  Gastrointestinal:  Negative for abdominal distention, abdominal pain and constipation.  Endocrine: Negative for polydipsia.  Genitourinary:  Negative for dysuria and frequency.  Musculoskeletal:  Negative for arthralgias and back pain.  Skin:  Negative for rash.  Neurological:  Negative for tremors, light-headedness and numbness.  Hematological:  Does not bruise/bleed easily.  Psychiatric/Behavioral:  Negative for agitation and behavioral problems.    Objective:  BP 131/75    Pulse 60    Ht 5\' 2"  (1.575 m)    Wt 141 lb 3.2 oz (64 kg)    LMP 11/12/2013    SpO2 100%    BMI 25.83 kg/m   BP/Weight 03/12/2021 12/24/2020 [id number]  Systolic BP A999333 [id number]  Diastolic BP 75 72 62  Wt. (Lbs) 141.2 144.8 144.6  BMI 25.83 26.48 26.45      Physical Exam Exam conducted with a chaperone present.  Constitutional:      General: She is not in acute distress.    Appearance: She is well-developed. She is not diaphoretic.  HENT:     Head: Normocephalic.     Right Ear: External ear normal.     Left Ear: External ear normal.     Nose: Nose normal.  Eyes:     Conjunctiva/sclera: Conjunctivae normal.     Pupils: Pupils are equal, round, and reactive to light.  Neck:     Vascular: No JVD.  Cardiovascular:     Rate and Rhythm: Normal rate and regular rhythm.     Heart sounds: Normal heart sounds. No murmur heard.   No gallop.  Pulmonary:     Effort: Pulmonary effort is normal. No respiratory distress.     Breath sounds: Normal breath sounds. No wheezing or rales.  Chest:     Chest wall: No tenderness.  Breasts:     Right: Normal. No mass, nipple discharge or tenderness.     Left: Normal. No mass, nipple discharge or tenderness.  Abdominal:     General: Bowel sounds are normal. There is no distension.     Palpations: Abdomen is soft. There is no mass.     Tenderness: There is no abdominal tenderness.     Hernia: There is no hernia in the left inguinal area or right inguinal area.  Genitourinary:    General: Normal vulva.     Pubic  Area: No rash.      Labia:        Right: No rash.        Left: No rash.      Vagina: Normal.     Cervix: Normal.     Uterus: Normal.      Adnexa: Right adnexa normal and left adnexa normal.       Right: No tenderness.         Left: No tenderness.    Musculoskeletal:        General: No tenderness. Normal range of motion.     Cervical back: Normal range of motion. No tenderness.  Lymphadenopathy:     Upper Body:     Right upper body: No supraclavicular or axillary adenopathy.     Left upper body: No supraclavicular or axillary adenopathy.  Skin:    General: Skin is warm and dry.  Neurological:     Mental Status: She is alert and oriented to person, place, and time.     Deep Tendon Reflexes: Reflexes are normal and symmetric.    CMP Latest Ref Rng & Units 12/25/2020 04/22/2020 08/29/2019  Glucose 70 - 99 mg/dL 115(H) 75 79  BUN 6 - 24 mg/dL 16 16 21   Creatinine 0.57 - 1.00 mg/dL 0.77 0.87 0.83  Sodium 134 - 144 mmol/L 142 140 143  Potassium 3.5 - 5.2 mmol/L 4.7 5.1 4.9  Chloride 96 - 106 mmol/L 106 104 106  CO2 20 - 29 mmol/L 22 22 22   Calcium 8.7 - 10.2 mg/dL 9.7 9.6 9.9  Total Protein 6.0 - 8.5 g/dL - 7.6 7.6  Total Bilirubin 0.0 - 1.2 mg/dL - 0.3 0.3  Alkaline Phos 44 - 121 IU/L - 126(H) 136(H)  AST 0 - 40 IU/L - 18 14  ALT 0 - 32 IU/L - 11 10    Lipid Panel     Component Value Date/Time   CHOL 103 04/22/2020 1205   TRIG 186 (H) 04/22/2020 1205   HDL 36 (L) 04/22/2020 1205   CHOLHDL 2.9 04/22/2020 1205   CHOLHDL 3.3 04/14/2016 0928   VLDL 45 (H)  09/25/2015 1144   LDLCALC 37 04/22/2020 1205    CBC    Component Value Date/Time   WBC 9.5 12/20/2017 1403   RBC 4.38 12/20/2017 1403   HGB 12.6 12/20/2017 1403   HCT 40.3 12/20/2017 1403   PLT 212 12/20/2017 1403   MCV 92.0 12/20/2017 1403   MCH 28.8 12/20/2017 1403   MCHC 31.3 12/20/2017 1403   RDW 14.2 12/20/2017 1403   LYMPHSABS 0.6 (L) 12/20/2017 1403   MONOABS 0.6 12/20/2017 1403   EOSABS 0.0 12/20/2017 1403   BASOSABS 0.0 12/20/2017 1403    Lab Results  Component Value Date   HGBA1C 7.3 (A) 12/24/2020    Assessment & Plan:  1. Annual physical exam Counseled on 150 minutes of exercise per week, healthy eating (including decreased daily intake of saturated fats, cholesterol, added sugars, sodium), STI prevention, routine healthcare maintenance.   2. Encounter for screening mammogram for malignant neoplasm of breast - MM DIGITAL SCREENING BILATERAL; Future  3. Screening for colon cancer - Fecal occult blood, imunochemical  4. Screening for cervical cancer - Cytology - PAP  5. Acute cough - benzonatate (TESSALON) 100 MG capsule; Take 1 capsule (100 mg total) by mouth 2 (two) times daily as needed for cough.  Dispense: 20 capsule; Refill: 0    No orders of the defined types were placed in this encounter.  Follow-up: Return in about 3 months (around 06/09/2021) for Chronic medical conditions.       Charlott Rakes, MD, FAAFP. [facility] and [place], [place]   03/12/2021, 10:19 AM

## 2021-03-13 ENCOUNTER — Other Ambulatory Visit: Payer: Self-pay

## 2021-03-17 LAB — CYTOLOGY - PAP
Comment: NEGATIVE
Comment: NEGATIVE
Diagnosis: NEGATIVE
HPV 16: NEGATIVE
HPV 18 / 45: NEGATIVE
High risk HPV: POSITIVE — AB

## 2021-03-20 ENCOUNTER — Other Ambulatory Visit: Payer: Self-pay

## 2021-03-21 ENCOUNTER — Telehealth: Payer: Self-pay

## 2021-03-21 NOTE — Telephone Encounter (Signed)
Patient name and DOB has been verified Patient was informed of lab results. Patient had no questions.  

## 2021-03-21 NOTE — Telephone Encounter (Signed)
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

## 2021-04-25 ENCOUNTER — Other Ambulatory Visit: Payer: Self-pay

## 2021-04-28 ENCOUNTER — Other Ambulatory Visit: Payer: Self-pay

## 2021-05-23 ENCOUNTER — Other Ambulatory Visit: Payer: Self-pay | Admitting: Family Medicine

## 2021-05-23 DIAGNOSIS — E1165 Type 2 diabetes mellitus with hyperglycemia: Secondary | ICD-10-CM

## 2021-05-26 ENCOUNTER — Other Ambulatory Visit: Payer: Self-pay

## 2021-05-26 MED ORDER — TRULICITY 0.75 MG/0.5ML ~~LOC~~ SOAJ
0.7500 mg | SUBCUTANEOUS | 6 refills | Status: DC
  Filled 2021-05-26: qty 2, 28d supply, fill #0
  Filled 2021-06-30: qty 2, 28d supply, fill #1
  Filled 2021-07-29: qty 2, 28d supply, fill #2
  Filled 2021-10-23: qty 2, 28d supply, fill #3
  Filled 2021-12-14: qty 2, 28d supply, fill #4

## 2021-05-26 MED ORDER — METFORMIN HCL 1000 MG PO TABS
ORAL_TABLET | Freq: Two times a day (BID) | ORAL | 3 refills | Status: DC
  Filled 2021-05-26: qty 60, 30d supply, fill #0
  Filled 2021-06-30: qty 60, 30d supply, fill #1
  Filled 2021-07-29: qty 120, 60d supply, fill #2

## 2021-05-26 MED ORDER — GLIPIZIDE 10 MG PO TABS
ORAL_TABLET | Freq: Two times a day (BID) | ORAL | 3 refills | Status: DC
  Filled 2021-05-26: qty 60, 30d supply, fill #0
  Filled 2021-06-30: qty 60, 30d supply, fill #1
  Filled 2021-07-29: qty 60, 30d supply, fill #2
  Filled 2021-10-23: qty 60, 30d supply, fill #3

## 2021-05-26 MED ORDER — ATORVASTATIN CALCIUM 40 MG PO TABS
ORAL_TABLET | Freq: Every day | ORAL | 3 refills | Status: DC
  Filled 2021-05-26: qty 30, 30d supply, fill #0
  Filled 2021-06-30: qty 30, 30d supply, fill #1
  Filled 2021-07-29: qty 60, 60d supply, fill #2

## 2021-05-26 NOTE — Telephone Encounter (Signed)
Requested Prescriptions  ?Pending Prescriptions Disp Refills  ?? atorvastatin (LIPITOR) 40 MG tablet 30 tablet 3  ?  Sig: TAKE 1 TABLET (40 MG TOTAL) BY MOUTH DAILY.  ?  ? Cardiovascular:  Antilipid - Statins Failed - 05/23/2021  9:45 PM  ?  ?  Failed - Lipid Panel in normal range within the last 12 months  ?  Cholesterol, Total  ?Date Value Ref Range Status  ?04/22/2020 103 100 - 199 mg/dL Final  ? ?LDL Chol Calc (NIH)  ?Date Value Ref Range Status  ?04/22/2020 37 0 - 99 mg/dL Final  ? ?HDL  ?Date Value Ref Range Status  ?04/22/2020 36 (L) >39 mg/dL Final  ? ?Triglycerides  ?Date Value Ref Range Status  ?04/22/2020 186 (H) 0 - 149 mg/dL Final  ? ?  ?  ?  Passed - Patient is not pregnant  ?  ?  Passed - Valid encounter within last 12 months  ?  Recent Outpatient Visits   ?      ? 2 months ago Annual physical exam  ? [place], Charlane Ferretti, MD  ? 5 months ago Type 2 diabetes mellitus with other specified complication, without long-term current use of insulin ([place])  ? [place], Charlane Ferretti, MD  ? 8 months ago Type 2 diabetes mellitus with hyperglycemia, without long-term current use of insulin ([place])  ? [place] Charlott Rakes, MD  ? 1 year ago Screening for colon cancer  ? [place], Charlane Ferretti, MD  ? 1 year ago Type 2 diabetes mellitus with hyperglycemia, without long-term current use of insulin ([place])  ? [place] Charlott Rakes, MD  ?  ?  ?Future Appointments   ?        ? In 4 weeks Charlott Rakes, MD [place]  ?  ? ?  ?  ?  ?? glipiZIDE (GLUCOTROL) 10 MG tablet 60 tablet 3  ?  Sig: TAKE 1 TABLET (10 MG TOTAL) BY MOUTH 2 (TWO) TIMES DAILY BEFORE A MEAL.  ?  ? Endocrinology:  Diabetes - Sulfonylureas Passed - 05/23/2021  9:45 PM  ?  ?  Passed - HBA1C is between 0 and 7.9 and within 180 days  ?  HbA1c, POC  (controlled diabetic range)  ?Date Value Ref Range Status  ?12/24/2020 7.3 (A) 0.0 - 7.0 % Final  ?   ?  ?  Passed - Cr in normal range and within 360 days  ?  Creat  ?Date Value Ref Range Status  ?04/14/2016 0.76 0.50 - 1.05 mg/dL Final  ?  Comment:  ?    ?For patients > or = 55 years of age: The upper reference limit for ?Creatinine is approximately 13% higher for people identified as ?African-American. ?  ?  ? ?Creatinine, Ser  ?Date Value Ref Range Status  ?12/25/2020 0.77 0.57 - 1.00 mg/dL Final  ? ?Creatinine, Urine  ?Date Value Ref Range Status  ?04/14/2016 75 20 - 320 mg/dL Final  ?   ?  ?  Passed - Valid encounter within last 6 months  ?  Recent Outpatient Visits   ?      ? 2 months ago Annual physical exam  ? [place], Charlane Ferretti, MD  ? 5 months ago Type 2 diabetes mellitus with other specified complication, without long-term current use  of insulin ([place])  ? [place], Charlane Ferretti, MD  ? 8 months ago Type 2 diabetes mellitus with hyperglycemia, without long-term current use of insulin ([place])  ? [place] Charlott Rakes, MD  ? 1 year ago Screening for colon cancer  ? [place], Charlane Ferretti, MD  ? 1 year ago Type 2 diabetes mellitus with hyperglycemia, without long-term current use of insulin ([place])  ? [place] Charlott Rakes, MD  ?  ?  ?Future Appointments   ?        ? In 4 weeks Charlott Rakes, MD [place]  ?  ? ?  ?  ?  ?? metFORMIN (GLUCOPHAGE) 1000 MG tablet 60 tablet 3  ?  Sig: TAKE 1 TABLET (1,000 MG TOTAL) BY MOUTH 2 (TWO) TIMES DAILY WITH A MEAL.  ?  ? Endocrinology:  Diabetes - Biguanides Failed - 05/23/2021  9:45 PM  ?  ?  Failed - B12 Level in normal range and within 720 days  ?  No results found for: VITAMINB12   ?  ?  Failed - CBC within normal limits and completed in the last 12 months  ?  WBC   ?Date Value Ref Range Status  ?12/20/2017 9.5 4.0 - 10.5 K/uL Final  ? ?RBC  ?Date Value Ref Range Status  ?12/20/2017 4.38 3.87 - 5.11 MIL/uL Final  ? ?Hemoglobin  ?Date Value Ref Range Status  ?12/20/2017 12.6 12.0 - 15.0 g/dL Final  ? ?HCT  ?Date Value Ref Range Status  ?12/20/2017 40.3 36.0 - 46.0 % Final  ? ?MCHC  ?Date Value Ref Range Status  ?12/20/2017 31.3 30.0 - 36.0 g/dL Final  ? ?MCH  ?Date Value Ref Range Status  ?12/20/2017 28.8 26.0 - 34.0 pg Final  ? ?MCV  ?Date Value Ref Range Status  ?12/20/2017 92.0 80.0 - 100.0 fL Final  ? ?No results found for: PLTCOUNTKUC, LABPLAT, [place] ?RDW  ?Date Value Ref Range Status  ?12/20/2017 14.2 11.5 - 15.5 % Final  ? ?  ?  ?  Passed - Cr in normal range and within 360 days  ?  Creat  ?Date Value Ref Range Status  ?04/14/2016 0.76 0.50 - 1.05 mg/dL Final  ?  Comment:  ?    ?For patients > or = 55 years of age: The upper reference limit for ?Creatinine is approximately 13% higher for people identified as ?African-American. ?  ?  ? ?Creatinine, Ser  ?Date Value Ref Range Status  ?12/25/2020 0.77 0.57 - 1.00 mg/dL Final  ? ?Creatinine, Urine  ?Date Value Ref Range Status  ?04/14/2016 75 20 - 320 mg/dL Final  ?   ?  ?  Passed - HBA1C is between 0 and 7.9 and within 180 days  ?  HbA1c, POC (controlled diabetic range)  ?Date Value Ref Range Status  ?12/24/2020 7.3 (A) 0.0 - 7.0 % Final  ?   ?  ?  Passed - eGFR in normal range and within 360 days  ?  GFR, Est African American  ?Date Value Ref Range Status  ?04/14/2016 >89 >=60 mL/min Final  ? ?GFR calc Af Amer  ?Date Value Ref Range Status  ?08/29/2019 93 >59 mL/min/1.73 Final  ?  Comment:  ?  **[facility] currently reports eGFR in compliance with the current** ?  recommendations of the [employer]. [facility] will ?  update reporting as  new guidelines are published from the NKF-ASN ?  Task force. ?  ? ?GFR, Est Non African American  ?Date Value Ref Range Status  ?04/14/2016 >89 >=60 mL/min Final  ? ?GFR calc non  Af Amer  ?Date Value Ref Range Status  ?08/29/2019 81 >59 mL/min/1.73 Final  ? ?eGFR  ?Date Value Ref Range Status  ?12/25/2020 92 >59 mL/min/1.73 Final  ?   ?  ?  Passed - Valid encounter within last 6 months  ?  Recent Outpatient Visits   ?      ? 2 months ago Annual physical exam  ? [place], Charlane Ferretti, MD  ? 5 months ago Type 2 diabetes mellitus with other specified complication, without long-term current use of insulin ([place])  ? [place], Charlane Ferretti, MD  ? 8 months ago Type 2 diabetes mellitus with hyperglycemia, without long-term current use of insulin ([place])  ? [place] Charlott Rakes, MD  ? 1 year ago Screening for colon cancer  ? [place], Charlane Ferretti, MD  ? 1 year ago Type 2 diabetes mellitus with hyperglycemia, without long-term current use of insulin ([place])  ? [place] Charlott Rakes, MD  ?  ?  ?Future Appointments   ?        ? In 4 weeks Charlott Rakes, MD [place]  ?  ? ?  ?  ?  ?? Dulaglutide (TRULICITY) 4.32 XM/1.4JW SOPN 2 mL 6  ?  Sig: Inject 0.75 mg into the skin once a week.  ?  ? Endocrinology:  Diabetes - GLP-1 Receptor Agonists Passed - 05/23/2021  9:45 PM  ?  ?  Passed - HBA1C is between 0 and 7.9 and within 180 days  ?  HbA1c, POC (controlled diabetic range)  ?Date Value Ref Range Status  ?12/24/2020 7.3 (A) 0.0 - 7.0 % Final  ?   ?  ?  Passed - Valid encounter within last 6 months  ?  Recent Outpatient Visits   ?      ? 2 months ago Annual physical exam  ? [place], Charlane Ferretti, MD  ? 5 months ago Type 2 diabetes mellitus with other specified complication, without long-term current use of insulin ([place])  ? [place], Charlane Ferretti, MD  ? 8 months ago Type 2 diabetes mellitus with hyperglycemia, without long-term  current use of insulin ([place])  ? [place] Charlott Rakes, MD  ? 1 year ago Screening for colon cancer  ? [place] Charlott Rakes, MD  ? 1 year ago

## 2021-05-27 ENCOUNTER — Other Ambulatory Visit: Payer: Self-pay

## 2021-06-23 ENCOUNTER — Ambulatory Visit: Payer: Self-pay | Admitting: Family Medicine

## 2021-07-01 ENCOUNTER — Other Ambulatory Visit: Payer: Self-pay

## 2021-07-02 ENCOUNTER — Other Ambulatory Visit: Payer: Self-pay

## 2021-07-22 ENCOUNTER — Other Ambulatory Visit: Payer: Self-pay

## 2021-07-22 ENCOUNTER — Ambulatory Visit: Payer: Self-pay | Attending: Family Medicine | Admitting: Internal Medicine

## 2021-07-22 ENCOUNTER — Encounter: Payer: Self-pay | Admitting: Internal Medicine

## 2021-07-22 VITALS — BP 117/68 | HR 62 | Temp 98.1°F | Resp 16 | Wt 149.2 lb

## 2021-07-22 DIAGNOSIS — H9192 Unspecified hearing loss, left ear: Secondary | ICD-10-CM

## 2021-07-22 DIAGNOSIS — M545 Low back pain, unspecified: Secondary | ICD-10-CM

## 2021-07-22 DIAGNOSIS — R051 Acute cough: Secondary | ICD-10-CM

## 2021-07-22 DIAGNOSIS — E1169 Type 2 diabetes mellitus with other specified complication: Secondary | ICD-10-CM

## 2021-07-22 LAB — GLUCOSE, POCT (MANUAL RESULT ENTRY): POC Glucose: 153 mg/dl — AB (ref 70–99)

## 2021-07-22 LAB — POCT URINALYSIS DIP (CLINITEK)
Bilirubin, UA: NEGATIVE
Blood, UA: NEGATIVE
Glucose, UA: NEGATIVE mg/dL
Ketones, POC UA: NEGATIVE mg/dL
Nitrite, UA: NEGATIVE
POC PROTEIN,UA: NEGATIVE
Spec Grav, UA: 1.025 (ref 1.010–1.025)
Urobilinogen, UA: 0.2 E.U./dL
pH, UA: 6.5 (ref 5.0–8.0)

## 2021-07-22 LAB — POCT GLYCOSYLATED HEMOGLOBIN (HGB A1C): Hemoglobin A1C: 7 % — AB (ref 4.0–5.6)

## 2021-07-22 MED ORDER — NAPROXEN 500 MG PO TABS
500.0000 mg | ORAL_TABLET | Freq: Two times a day (BID) | ORAL | 0 refills | Status: DC
  Filled 2021-07-22: qty 14, 7d supply, fill #0

## 2021-07-22 MED ORDER — CANAGLIFLOZIN 100 MG PO TABS
ORAL_TABLET | Freq: Every day | ORAL | 0 refills | Status: DC
  Filled 2021-07-22: qty 90, 90d supply, fill #0
  Filled 2021-07-22: qty 30, 30d supply, fill #0

## 2021-07-22 MED ORDER — LORATADINE 10 MG PO TABS
10.0000 mg | ORAL_TABLET | Freq: Every day | ORAL | 0 refills | Status: AC
  Filled 2021-07-22: qty 30, 30d supply, fill #0

## 2021-07-22 MED ORDER — METHOCARBAMOL 500 MG PO TABS
500.0000 mg | ORAL_TABLET | Freq: Two times a day (BID) | ORAL | 0 refills | Status: DC | PRN
  Filled 2021-07-22: qty 20, 10d supply, fill #0
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
## 2021-07-22 NOTE — Progress Notes (Signed)
Patient has lower back that she says is 7/10. Patient has been taking OTC medication to ease of pain. Patient say he medication is not helping.

## 2021-07-29 ENCOUNTER — Other Ambulatory Visit: Payer: Self-pay | Admitting: Internal Medicine

## 2021-07-29 ENCOUNTER — Other Ambulatory Visit: Payer: Self-pay

## 2021-07-29 DIAGNOSIS — M545 Low back pain, unspecified: Secondary | ICD-10-CM

## 2021-07-29 MED ORDER — NAPROXEN 500 MG PO TABS
500.0000 mg | ORAL_TABLET | Freq: Two times a day (BID) | ORAL | 0 refills | Status: AC
  Filled 2021-07-29: qty 14, 7d supply, fill #0

## 2021-07-29 MED ORDER — METHOCARBAMOL 500 MG PO TABS
500.0000 mg | ORAL_TABLET | Freq: Two times a day (BID) | ORAL | 0 refills | Status: DC | PRN
  Filled 2021-07-29: qty 20, 10d supply, fill #0

## 2021-07-30 ENCOUNTER — Other Ambulatory Visit: Payer: Self-pay

## 2021-08-05 ENCOUNTER — Other Ambulatory Visit: Payer: Self-pay

## 2021-08-12 ENCOUNTER — Encounter (HOSPITAL_COMMUNITY): Payer: Self-pay

## 2021-08-12 ENCOUNTER — Emergency Department (HOSPITAL_COMMUNITY): Payer: Self-pay

## 2021-08-12 ENCOUNTER — Emergency Department (HOSPITAL_COMMUNITY)
Admission: EM | Admit: 2021-08-12 | Discharge: 2021-08-12 | Disposition: A | Discharge status: Other Acute Inpatient Hospital | Payer: Self-pay | Attending: Emergency Medicine | Admitting: Emergency Medicine

## 2021-08-12 ENCOUNTER — Other Ambulatory Visit: Payer: Self-pay

## 2021-08-12 DIAGNOSIS — N179 Acute kidney failure, unspecified: Secondary | ICD-10-CM | POA: Insufficient documentation

## 2021-08-12 DIAGNOSIS — E8721 Acute metabolic acidosis: Secondary | ICD-10-CM | POA: Insufficient documentation

## 2021-08-12 DIAGNOSIS — M7918 Myalgia, other site: Secondary | ICD-10-CM | POA: Insufficient documentation

## 2021-08-12 DIAGNOSIS — R110 Nausea: Secondary | ICD-10-CM

## 2021-08-12 DIAGNOSIS — M791 Myalgia, unspecified site: Secondary | ICD-10-CM

## 2021-08-12 DIAGNOSIS — Z7984 Long term (current) use of oral hypoglycemic drugs: Secondary | ICD-10-CM | POA: Insufficient documentation

## 2021-08-12 DIAGNOSIS — R748 Abnormal levels of other serum enzymes: Secondary | ICD-10-CM | POA: Insufficient documentation

## 2021-08-12 DIAGNOSIS — Z20822 Contact with and (suspected) exposure to covid-19: Secondary | ICD-10-CM | POA: Insufficient documentation

## 2021-08-12 DIAGNOSIS — E119 Type 2 diabetes mellitus without complications: Secondary | ICD-10-CM | POA: Insufficient documentation

## 2021-08-12 LAB — CBC
HCT: 37.4 % (ref 36.0–46.0)
Hemoglobin: 12.2 g/dL (ref 12.0–15.0)
MCH: 29.6 pg (ref 26.0–34.0)
MCHC: 32.6 g/dL (ref 30.0–36.0)
MCV: 90.8 fL (ref 80.0–100.0)
Platelets: 193 10*3/uL (ref 150–400)
RBC: 4.12 MIL/uL (ref 3.87–5.11)
RDW: 13.7 % (ref 11.5–15.5)
WBC: 11.9 10*3/uL — ABNORMAL HIGH (ref 4.0–10.5)
nRBC: 0 % (ref 0.0–0.2)

## 2021-08-12 LAB — URINALYSIS, ROUTINE W REFLEX MICROSCOPIC
Bilirubin Urine: NEGATIVE
Glucose, UA: >=500 mg/dL — AB
Hgb urine dipstick: NEGATIVE
Ketones, ur: NEGATIVE mg/dL
Leukocytes,Ua: NEGATIVE
Nitrite: NEGATIVE
Protein, ur: NEGATIVE mg/dL
Specific Gravity, Urine: 1.024 (ref 1.005–1.030)
pH: 5 (ref 5.0–8.0)

## 2021-08-12 LAB — COMPREHENSIVE METABOLIC PANEL
ALT: 16 U/L (ref 0–44)
AST: 21 U/L (ref 15–41)
Albumin: 3.5 g/dL (ref 3.5–5.0)
Alkaline Phosphatase: 130 U/L — ABNORMAL HIGH (ref 38–126)
Anion gap: 15 (ref 5–15)
BUN: 27 mg/dL — ABNORMAL HIGH (ref 6–20)
CO2: 20 mmol/L — ABNORMAL LOW (ref 22–32)
Calcium: 9.7 mg/dL (ref 8.9–10.3)
Chloride: 103 mmol/L (ref 98–111)
Creatinine, Ser: 1.36 mg/dL — ABNORMAL HIGH (ref 0.44–1.00)
GFR, Estimated: 46 mL/min — ABNORMAL LOW (ref >60)
Glucose, Bld: 174 mg/dL — ABNORMAL HIGH (ref 70–99)
Potassium: 4.9 mmol/L (ref 3.5–5.1)
Sodium: 138 mmol/L (ref 135–145)
Total Bilirubin: 0.9 mg/dL (ref 0.3–1.2)
Total Protein: 8.2 g/dL — ABNORMAL HIGH (ref 6.5–8.1)

## 2021-08-12 LAB — SARS CORONAVIRUS 2 BY RT PCR: SARS Coronavirus 2 by RT PCR: NEGATIVE

## 2021-08-12 LAB — LIPASE, BLOOD: Lipase: 30 U/L (ref 11–51)

## 2021-08-12 LAB — HCG, SERUM, QUALITATIVE: Preg, Serum: NEGATIVE

## 2021-08-12 MED ORDER — PROCHLORPERAZINE EDISYLATE 10 MG/2ML IJ SOLN
10.0000 mg | Freq: Once | INTRAMUSCULAR | Status: AC
  Administered 2021-08-12: 10 mg via INTRAVENOUS
  Filled 2021-08-12: qty 2

## 2021-08-12 MED ORDER — SODIUM CHLORIDE 0.9 % IV BOLUS
1000.0000 mL | Freq: Once | INTRAVENOUS | Status: AC
  Administered 2021-08-12: 1000 mL via INTRAVENOUS

## 2021-08-12 MED ORDER — ONDANSETRON HCL 4 MG PO TABS
4.0000 mg | ORAL_TABLET | Freq: Four times a day (QID) | ORAL | 0 refills | Status: AC
Start: 2021-08-12 — End: ?

## 2021-08-12 MED ORDER — MORPHINE SULFATE (PF) 2 MG/ML IV SOLN
2.0000 mg | Freq: Once | INTRAVENOUS | Status: AC
  Administered 2021-08-12: 2 mg via INTRAVENOUS
  Filled 2021-08-12: qty 1

## 2021-08-12 NOTE — Discharge Instructions (Signed)
As we discussed it and that you may have a minor viral stomach infection or other minor illness that caused her nausea and vomiting, as well as body aches.  I recommend ibuprofen, Tylenol for pain, body aches, and I recommend that you drink plenty of fluids, including water, Pedialyte, Gatorade.  You can use the nausea medication that I prescribed to help with any ongoing nausea.  You did have a minor elevation of your creatinine likely secondary to dehydration as we discussed, I do recommend that you follow-up with your primary care doctor in the next 1 to 2 weeks to ensure that your kidney function is returning to normal.  Please return to the emergency department if your symptoms worsen or fail to improve despite treatment.
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
## 2021-08-13 ENCOUNTER — Emergency Department (HOSPITAL_COMMUNITY): Payer: Self-pay

## 2021-08-13 ENCOUNTER — Encounter (HOSPITAL_COMMUNITY): Payer: Self-pay | Admitting: Emergency Medicine

## 2021-08-13 ENCOUNTER — Emergency Department (HOSPITAL_COMMUNITY)
Admission: EM | Admit: 2021-08-13 | Discharge: 2021-08-14 | Disposition: A | Discharge status: Home / Self Care | Payer: Self-pay | Attending: Emergency Medicine | Admitting: Emergency Medicine

## 2021-08-13 DIAGNOSIS — R509 Fever, unspecified: Secondary | ICD-10-CM | POA: Insufficient documentation

## 2021-08-13 DIAGNOSIS — M545 Low back pain, unspecified: Secondary | ICD-10-CM | POA: Insufficient documentation

## 2021-08-13 DIAGNOSIS — G8929 Other chronic pain: Secondary | ICD-10-CM | POA: Insufficient documentation

## 2021-08-13 LAB — URINALYSIS, ROUTINE W REFLEX MICROSCOPIC
Bilirubin Urine: NEGATIVE
Glucose, UA: >=500 mg/dL — AB
Ketones, ur: 5 mg/dL — AB
Leukocytes,Ua: NEGATIVE
Nitrite: NEGATIVE
Protein, ur: NEGATIVE mg/dL
Specific Gravity, Urine: 1.026 (ref 1.005–1.030)
pH: 5 (ref 5.0–8.0)

## 2021-08-13 LAB — COMPREHENSIVE METABOLIC PANEL
ALT: 16 U/L (ref 0–44)
AST: 20 U/L (ref 15–41)
Albumin: 2.7 g/dL — ABNORMAL LOW (ref 3.5–5.0)
Alkaline Phosphatase: 119 U/L (ref 38–126)
Anion gap: 11 (ref 5–15)
BUN: 20 mg/dL (ref 6–20)
CO2: 18 mmol/L — ABNORMAL LOW (ref 22–32)
Calcium: 8.2 mg/dL — ABNORMAL LOW (ref 8.9–10.3)
Chloride: 103 mmol/L (ref 98–111)
Creatinine, Ser: 1.28 mg/dL — ABNORMAL HIGH (ref 0.44–1.00)
GFR, Estimated: 49 mL/min — ABNORMAL LOW (ref >60)
Glucose, Bld: 304 mg/dL — ABNORMAL HIGH (ref 70–99)
Potassium: 4.4 mmol/L (ref 3.5–5.1)
Sodium: 132 mmol/L — ABNORMAL LOW (ref 135–145)
Total Bilirubin: 0.7 mg/dL (ref 0.3–1.2)
Total Protein: 6.7 g/dL (ref 6.5–8.1)

## 2021-08-13 LAB — LACTIC ACID, PLASMA: Lactic Acid, Venous: 1.4 mmol/L (ref 0.5–1.9)

## 2021-08-13 LAB — CBC WITH DIFFERENTIAL/PLATELET
Abs Immature Granulocytes: 0.03 10*3/uL (ref 0.00–0.07)
Basophils Absolute: 0 10*3/uL (ref 0.0–0.1)
Basophils Relative: 0 %
Eosinophils Absolute: 0 10*3/uL (ref 0.0–0.5)
Eosinophils Relative: 0 %
HCT: 31.9 % — ABNORMAL LOW (ref 36.0–46.0)
Hemoglobin: 10.2 g/dL — ABNORMAL LOW (ref 12.0–15.0)
Immature Granulocytes: 0 %
Lymphocytes Relative: 7 %
Lymphs Abs: 0.5 10*3/uL — ABNORMAL LOW (ref 0.7–4.0)
MCH: 28.4 pg (ref 26.0–34.0)
MCHC: 32 g/dL (ref 30.0–36.0)
MCV: 88.9 fL (ref 80.0–100.0)
Monocytes Absolute: 0.4 10*3/uL (ref 0.1–1.0)
Monocytes Relative: 5 %
Neutro Abs: 6.6 10*3/uL (ref 1.7–7.7)
Neutrophils Relative %: 88 %
Platelets: 165 10*3/uL (ref 150–400)
RBC: 3.59 MIL/uL — ABNORMAL LOW (ref 3.87–5.11)
RDW: 13.7 % (ref 11.5–15.5)
WBC: 7.6 10*3/uL (ref 4.0–10.5)
nRBC: 0 % (ref 0.0–0.2)

## 2021-08-13 LAB — C-REACTIVE PROTEIN: CRP: 24.3 mg/dL — ABNORMAL HIGH (ref <1.0)

## 2021-08-13 MED ORDER — SODIUM CHLORIDE 0.9 % IV BOLUS
1000.0000 mL | Freq: Once | INTRAVENOUS | Status: AC
  Administered 2021-08-13: 1000 mL via INTRAVENOUS

## 2021-08-13 MED ORDER — SODIUM CHLORIDE 0.9 % IV SOLN
1.0000 g | Freq: Once | INTRAVENOUS | Status: AC
  Administered 2021-08-13: 1 g via INTRAVENOUS
  Filled 2021-08-13: qty 10

## 2021-08-13 MED ORDER — FENTANYL CITRATE PF 50 MCG/ML IJ SOSY
50.0000 ug | PREFILLED_SYRINGE | Freq: Once | INTRAMUSCULAR | Status: AC
  Administered 2021-08-13: 50 ug via INTRAVENOUS
  Filled 2021-08-13: qty 1

## 2021-08-13 MED ORDER — GADOBUTROL 1 MMOL/ML IV SOLN
6.0000 mL | Freq: Once | INTRAVENOUS | Status: AC | PRN
  Administered 2021-08-13: 6 mL via INTRAVENOUS

## 2021-08-13 MED ORDER — MORPHINE SULFATE (PF) 4 MG/ML IV SOLN
4.0000 mg | Freq: Once | INTRAVENOUS | Status: AC
  Administered 2021-08-13: 4 mg via INTRAVENOUS
  Filled 2021-08-13: qty 1

## 2021-08-13 NOTE — ED Provider Triage Note (Signed)
Emergency Medicine Provider Triage Evaluation Note  Margaret Pace , a 56 y.o. female  was evaluated in triage.  Pt complains of continued back pain and fever.  Pain radiates down both of her legs.  Located in her lower back.  No chest pain.  Review of Systems  Positive: Back pain Negative: Chest pain  Physical Exam  LMP 11/12/2013  Gen:   Awake, no distress   Resp:  Normal effort  MSK:   Moves extremities without difficulty  Other:  Tenderness palpation of the lumbar spine at the midline paraspinal musculature diffusely  Medical Decision Making  Medically screening exam initiated at 12:51 PM.  Appropriate orders placed.  Chani Ghanem was informed that the remainder of the evaluation will be completed by another provider, this initial triage assessment does not replace that evaluation, and the importance of remaining in the ED until their evaluation is complete.  Labs ordered   Dietrich Pates, [place] 08/13/21 1254

## 2021-08-13 NOTE — ED Provider Notes (Signed)
[facility] EMERGENCY DEPARTMENT Provider Note   CSN: [id number] Arrival date & time: 08/13/21  1245     History {Add pertinent medical, surgical, social history, OB history to HPI:1} Chief Complaint  Patient presents with   Back Pain    Margaret Pace is a 56 y.o. female.  Spanish-speaking and she is asking for her son to translate.  She is coming in here with continued low back pain radiating down both her legs that have been going on for greater than a year.  He says she is gone to her primary care doctor for this in the past and he gives her pain medication without improvement.  No known trauma.  She was seen here yesterday for same had a CT head and lab work.  There was a fever at that time.  She denies any cough abdominal pain vomiting diarrhea or urinary symptoms.  She has not tried anything for her back pain recently.  She denies any weakness or numbness.  The history is provided by the patient and a relative.  Back Pain Location:  Lumbar spine Radiates to:  R thigh and L thigh Pain severity:  Severe Pain is:  Same all the time Onset quality:  Gradual Timing:  Constant Progression:  Unchanged Context: not recent injury   Relieved by:  Nothing Worsened by:  Ambulation Ineffective treatments:  OTC medications Associated symptoms: fever   Associated symptoms: no abdominal pain, no bladder incontinence, no bowel incontinence, no chest pain, no dysuria, no numbness and no weakness   Risk factors: no hx of cancer        Home Medications Prior to Admission medications   Medication Sig Start Date End Date Taking? Authorizing Provider  atorvastatin (LIPITOR) 40 MG tablet TAKE 1 TABLET (40 MG TOTAL) BY MOUTH DAILY. 05/26/21 05/26/22  Hoy Register, MD  Blood Glucose Monitoring Suppl (TRUE METRIX METER) DEVI 1 each by Does not apply route 3 (three) times daily before meals. 09/25/15   Hoy Register, MD  canagliflozin (INVOKANA) 100 MG TABS tablet TAKE 1  TABLET (100 MG TOTAL) BY MOUTH DAILY BEFORE BREAKFAST. 07/22/21 07/22/22  Marcine Matar, MD  Dulaglutide (TRULICITY) 0.75 MG/0.5ML SOPN Inject 0.75 mg into the skin once a week. 05/26/21   Hoy Register, MD  glipiZIDE (GLUCOTROL) 10 MG tablet TAKE 1 TABLET (10 MG TOTAL) BY MOUTH 2 (TWO) TIMES DAILY BEFORE A MEAL. 05/26/21 05/26/22  Hoy Register, MD  glucose blood test strip USE AS DIRECTED THREE TIMES DAILY 09/22/18   Hoy Register, MD  Insulin Pen Needle 31G X 5 MM MISC 1 each by Does not apply route at bedtime. 08/29/19   Hoy Register, MD  lidocaine (LIDODERM) 5 % Place 1 patch onto the skin daily. Remove & Discard patch within 12 hours or as directed by MD Patient not taking: Reported on 07/22/2021 05/30/19   Hoy Register, MD  lisinopril (ZESTRIL) 2.5 MG tablet TAKE 1 TABLET (2.5 MG TOTAL) BY MOUTH DAILY. 12/24/20 12/24/21  Hoy Register, MD  loratadine (CLARITIN) 10 MG tablet Take 1 tablet (10 mg total) by mouth daily. 07/22/21   Marcine Matar, MD  metFORMIN (GLUCOPHAGE) 1000 MG tablet TAKE 1 TABLET (1,000 MG TOTAL) BY MOUTH 2 (TWO) TIMES DAILY WITH A MEAL. 05/26/21 05/26/22  Hoy Register, MD  methocarbamol (ROBAXIN) 500 MG tablet Take 1 tablet (500 mg total) by mouth 2 (two) times daily as needed for muscle spasms. 07/29/21   Hoy Register, MD  naproxen (NAPROSYN) 500  MG tablet Take 1 tablet (500 mg total) by mouth 2 (two) times daily with a meal. 07/29/21   Newlin, Enobong, MD  ondansetron (ZOFRAN) 4 MG tablet Take 1 tablet (4 mg total) by mouth every 6 (six) hours. 08/12/21   Prosperi, Christian H, PA-C  TRUEPLUS LANCETS 28G MISC 1 each by Does not apply route 3 (three) times daily before meals. 03/23/17   Hoy Register, MD  valACYclovir (VALTREX) 1000 MG tablet Take 1 tablet (1,000 mg total) by mouth 3 (three) times daily. Patient not taking: Reported on 07/22/2021 12/21/17   Hoy Register, MD      Allergies    Patient has no known allergies.    Review of Systems    Review of Systems  Constitutional:  Positive for fever.  HENT:  Negative for sore throat.   Eyes:  Negative for visual disturbance.  Respiratory:  Negative for shortness of breath.   Cardiovascular:  Negative for chest pain.  Gastrointestinal:  Negative for abdominal pain and bowel incontinence.  Genitourinary:  Negative for bladder incontinence and dysuria.  Musculoskeletal:  Positive for back pain.  Skin:  Negative for rash.  Neurological:  Negative for weakness and numbness.    Physical Exam Updated Vital Signs BP (!) 88/49   Pulse 71   Temp 98.7 F (37.1 C) (Oral)   Resp 15   LMP 11/12/2013   SpO2 99%  Physical Exam Vitals and nursing note reviewed.  Constitutional:      General: She is not in acute distress.    Appearance: Normal appearance. She is well-developed.  HENT:     Head: Normocephalic and atraumatic.  Eyes:     Conjunctiva/sclera: Conjunctivae normal.  Cardiovascular:     Rate and Rhythm: Normal rate and regular rhythm.     Heart sounds: No murmur heard. Pulmonary:     Effort: Pulmonary effort is normal. No respiratory distress.     Breath sounds: Normal breath sounds.  Abdominal:     Palpations: Abdomen is soft.     Tenderness: There is no abdominal tenderness. There is no guarding or rebound.  Musculoskeletal:        General: Normal range of motion.     Cervical back: Neck supple.     Right lower leg: No edema.     Left lower leg: No edema.  Skin:    General: Skin is warm and dry.     Capillary Refill: Capillary refill takes less than 2 seconds.  Neurological:     General: No focal deficit present.     Mental Status: She is alert.     Sensory: No sensory deficit.     Motor: No weakness.     ED Results / Procedures / Treatments   Labs (all labs ordered are listed, but only abnormal results are displayed) Labs Reviewed  COMPREHENSIVE METABOLIC PANEL - Abnormal; Notable for the following components:      Result Value   Sodium 132 (*)     CO2 18 (*)    Glucose, Bld 304 (*)    Creatinine, Ser 1.28 (*)    Calcium 8.2 (*)    Albumin 2.7 (*)    GFR, Estimated 49 (*)    All other components within normal limits  CBC WITH DIFFERENTIAL/PLATELET - Abnormal; Notable for the following components:   RBC 3.59 (*)    Hemoglobin 10.2 (*)    HCT 31.9 (*)    Lymphs Abs 0.5 (*)    All other components within  normal limits  URINALYSIS, ROUTINE W REFLEX MICROSCOPIC - Abnormal; Notable for the following components:   Glucose, UA >=500 (*)    Hgb urine dipstick SMALL (*)    Ketones, ur 5 (*)    Bacteria, UA MANY (*)    All other components within normal limits  URINE CULTURE  CULTURE, BLOOD (ROUTINE X 2)  CULTURE, BLOOD (ROUTINE X 2)  LACTIC ACID, PLASMA  LACTIC ACID, PLASMA  SEDIMENTATION RATE  C-REACTIVE PROTEIN    EKG None  Radiology CT Head Wo Contrast  Result Date: 08/12/2021 CLINICAL DATA:  Headache EXAM: CT HEAD WITHOUT CONTRAST TECHNIQUE: Contiguous axial images were obtained from the base of the skull through the vertex without intravenous contrast. RADIATION DOSE REDUCTION: This exam was performed according to the departmental dose-optimization program which includes automated exposure control, adjustment of the mA and/or kV according to patient size and/or use of iterative reconstruction technique. COMPARISON:  CT head 05/14/2014 FINDINGS: Brain: No acute intracranial hemorrhage, mass effect, or herniation. No extra-axial fluid collections. No evidence of acute territorial infarct. No hydrocephalus. Vascular: No hyperdense vessel or unexpected calcification. Skull: Normal. Negative for fracture or focal lesion. Sinuses/Orbits: Old fracture deformity of the right lamina papyracea. No acute process identified. Other: Minimal opacification in the inferior left mastoid air cells. IMPRESSION: No acute intracranial process identified. Electronically Signed   By: Jannifer Hick M.D.   On: 08/12/2021 18:20     Procedures Procedures  {Document cardiac monitor, telemetry assessment procedure when appropriate:1}  Medications Ordered in ED Medications  sodium chloride 0.9 % bolus 1,000 mL (has no administration in time range)  morphine (PF) 4 MG/ML injection 4 mg (has no administration in time range)    ED Course/ Medical Decision Making/ A&P                           Medical Decision Making Amount and/or Complexity of Data Reviewed Labs: ordered. Radiology: ordered.  Risk Prescription drug management.  This patient complains of ***; this involves an extensive number of treatment Options and is a complaint that carries with it a high risk of complications and morbidity. The differential includes ***  I ordered, reviewed and interpreted labs, which included *** I ordered medication *** and reviewed PMP when indicated. I ordered imaging studies which included *** and I independently    visualized and interpreted imaging which showed *** Additional history obtained from *** Previous records obtained and reviewed *** I consulted *** and discussed lab and imaging findings and discussed disposition.  Cardiac monitoring reviewed, *** Social determinants considered, *** Critical Interventions: ***  After the interventions stated above, I reevaluated the patient and found *** Admission and further testing considered, ***    {Document critical care time when appropriate:1} {Document review of labs and clinical decision tools ie heart score, Chads2Vasc2 etc:1}  {Document your independent review of radiology images, and any outside records:1} {Document your discussion with family members, caretakers, and with consultants:1} {Document social determinants of health affecting pt's care:1} {Document your decision making why or why not admission, treatments were needed:1} Final Clinical Impression(s) / ED Diagnoses Final diagnoses:  None    Rx / DC Orders ED Discharge Orders     None

## 2021-08-13 NOTE — ED Triage Notes (Signed)
Patient BIB [facility] from home for evaluation of lower back pain and fever, seen for same recently. Patient called 911 again today due to continued fever and pain. Took 1000mg  tylenol PTA, received NS from EMS.  EMS Vitals BP 130/66 HR 104 RR 1 SpO2 96% on room air 20g saline lock in right forearm

## 2021-08-14 ENCOUNTER — Other Ambulatory Visit: Payer: Self-pay

## 2021-08-14 ENCOUNTER — Inpatient Hospital Stay (HOSPITAL_COMMUNITY)
Admission: EM | Admit: 2021-08-14 | Discharge: 2021-08-16 | DRG: 690 | Disposition: A | Discharge status: Home / Self Care | Payer: Self-pay | Attending: Internal Medicine | Admitting: Internal Medicine

## 2021-08-14 ENCOUNTER — Telehealth (HOSPITAL_BASED_OUTPATIENT_CLINIC_OR_DEPARTMENT_OTHER): Payer: Self-pay | Admitting: Emergency Medicine

## 2021-08-14 DIAGNOSIS — Z79899 Other long term (current) drug therapy: Secondary | ICD-10-CM

## 2021-08-14 DIAGNOSIS — D649 Anemia, unspecified: Secondary | ICD-10-CM | POA: Diagnosis present

## 2021-08-14 DIAGNOSIS — D61818 Other pancytopenia: Secondary | ICD-10-CM

## 2021-08-14 DIAGNOSIS — E119 Type 2 diabetes mellitus without complications: Secondary | ICD-10-CM

## 2021-08-14 DIAGNOSIS — N10 Acute tubulo-interstitial nephritis: Principal | ICD-10-CM | POA: Diagnosis present

## 2021-08-14 DIAGNOSIS — Z7985 Long-term (current) use of injectable non-insulin antidiabetic drugs: Secondary | ICD-10-CM

## 2021-08-14 DIAGNOSIS — Z833 Family history of diabetes mellitus: Secondary | ICD-10-CM

## 2021-08-14 DIAGNOSIS — R7881 Bacteremia: Secondary | ICD-10-CM | POA: Diagnosis present

## 2021-08-14 DIAGNOSIS — Z7984 Long term (current) use of oral hypoglycemic drugs: Secondary | ICD-10-CM

## 2021-08-14 DIAGNOSIS — Z794 Long term (current) use of insulin: Secondary | ICD-10-CM

## 2021-08-14 DIAGNOSIS — B9620 Unspecified Escherichia coli [E. coli] as the cause of diseases classified elsewhere: Secondary | ICD-10-CM | POA: Diagnosis present

## 2021-08-14 DIAGNOSIS — I10 Essential (primary) hypertension: Secondary | ICD-10-CM | POA: Diagnosis present

## 2021-08-14 DIAGNOSIS — Z20822 Contact with and (suspected) exposure to covid-19: Secondary | ICD-10-CM | POA: Diagnosis present

## 2021-08-14 LAB — CBC WITH DIFFERENTIAL/PLATELET
Abs Immature Granulocytes: 0.03 10*3/uL (ref 0.00–0.07)
Basophils Absolute: 0 10*3/uL (ref 0.0–0.1)
Basophils Relative: 0 %
Eosinophils Absolute: 0 10*3/uL (ref 0.0–0.5)
Eosinophils Relative: 0 %
HCT: 29.5 % — ABNORMAL LOW (ref 36.0–46.0)
Hemoglobin: 9.4 g/dL — ABNORMAL LOW (ref 12.0–15.0)
Immature Granulocytes: 1 %
Lymphocytes Relative: 11 %
Lymphs Abs: 0.6 10*3/uL — ABNORMAL LOW (ref 0.7–4.0)
MCH: 28.7 pg (ref 26.0–34.0)
MCHC: 31.9 g/dL (ref 30.0–36.0)
MCV: 89.9 fL (ref 80.0–100.0)
Monocytes Absolute: 0.4 10*3/uL (ref 0.1–1.0)
Monocytes Relative: 8 %
Neutro Abs: 4.1 10*3/uL (ref 1.7–7.7)
Neutrophils Relative %: 80 %
Platelets: 151 10*3/uL (ref 150–400)
RBC: 3.28 MIL/uL — ABNORMAL LOW (ref 3.87–5.11)
RDW: 14.1 % (ref 11.5–15.5)
WBC: 5.1 10*3/uL (ref 4.0–10.5)
nRBC: 0 % (ref 0.0–0.2)

## 2021-08-14 LAB — BLOOD CULTURE ID PANEL (REFLEXED) - BCID2

## 2021-08-14 LAB — COMPREHENSIVE METABOLIC PANEL
ALT: 29 U/L (ref 0–44)
AST: 54 U/L — ABNORMAL HIGH (ref 15–41)
Albumin: 2.6 g/dL — ABNORMAL LOW (ref 3.5–5.0)
Alkaline Phosphatase: 179 U/L — ABNORMAL HIGH (ref 38–126)
Anion gap: 12 (ref 5–15)
BUN: 14 mg/dL (ref 6–20)
CO2: 19 mmol/L — ABNORMAL LOW (ref 22–32)
Calcium: 8.6 mg/dL — ABNORMAL LOW (ref 8.9–10.3)
Chloride: 105 mmol/L (ref 98–111)
Creatinine, Ser: 1.03 mg/dL — ABNORMAL HIGH (ref 0.44–1.00)
GFR, Estimated: >60 mL/min (ref >60)
Glucose, Bld: 150 mg/dL — ABNORMAL HIGH (ref 70–99)
Potassium: 3.6 mmol/L (ref 3.5–5.1)
Sodium: 136 mmol/L (ref 135–145)
Total Bilirubin: 0.7 mg/dL (ref 0.3–1.2)
Total Protein: 6.3 g/dL — ABNORMAL LOW (ref 6.5–8.1)

## 2021-08-14 LAB — LACTIC ACID, PLASMA
Lactic Acid, Venous: 1.4 mmol/L (ref 0.5–1.9)
Lactic Acid, Venous: 2 mmol/L (ref 0.5–1.9)

## 2021-08-14 MED ORDER — ONDANSETRON HCL 4 MG PO TABS: 4.0000 mg | ORAL_TABLET | Freq: Four times a day (QID) | ORAL | Status: DC | PRN

## 2021-08-14 MED ORDER — ONDANSETRON HCL 4 MG/2ML IJ SOLN: 4.0000 mg | Freq: Four times a day (QID) | INTRAMUSCULAR | Status: DC | PRN

## 2021-08-14 MED ORDER — ENOXAPARIN SODIUM 40 MG/0.4ML IJ SOSY
40.0000 mg | PREFILLED_SYRINGE | INTRAMUSCULAR | Status: DC
  Administered 2021-08-15 – 2021-08-16 (×2): 40 mg via SUBCUTANEOUS
  Filled 2021-08-14 (×2): qty 0.4

## 2021-08-14 MED ORDER — CEPHALEXIN 500 MG PO CAPS
1000.0000 mg | ORAL_CAPSULE | Freq: Two times a day (BID) | ORAL | 0 refills | Status: DC
  Filled 2021-08-14: qty 28, 7d supply, fill #0

## 2021-08-14 MED ORDER — LACTATED RINGERS IV BOLUS
1000.0000 mL | Freq: Once | INTRAVENOUS | Status: AC
  Administered 2021-08-14: 1000 mL via INTRAVENOUS

## 2021-08-14 MED ORDER — SODIUM CHLORIDE 0.9 % IV SOLN
1.0000 g | Freq: Once | INTRAVENOUS | Status: AC
  Administered 2021-08-15: 1 g via INTRAVENOUS
  Filled 2021-08-14: qty 10

## 2021-08-14 MED ORDER — SODIUM CHLORIDE 0.9 % IV SOLN
2.0000 g | INTRAVENOUS | Status: DC
  Administered 2021-08-15: 2 g via INTRAVENOUS
  Filled 2021-08-14: qty 20

## 2021-08-14 MED ORDER — ACETAMINOPHEN 325 MG PO TABS: 650.0000 mg | ORAL_TABLET | Freq: Four times a day (QID) | ORAL | Status: DC | PRN

## 2021-08-14 MED ORDER — SODIUM CHLORIDE 0.9 % IV SOLN
1.0000 g | Freq: Once | INTRAVENOUS | Status: AC
  Administered 2021-08-14: 1 g via INTRAVENOUS
  Filled 2021-08-14: qty 10

## 2021-08-14 MED ORDER — ACETAMINOPHEN 650 MG RE SUPP: 650.0000 mg | Freq: Four times a day (QID) | RECTAL | Status: DC | PRN

## 2021-08-14 NOTE — ED Provider Triage Note (Signed)
Emergency Medicine Provider Triage Evaluation Note  Margaret Pace , a 56 y.o. female  was evaluated in triage.  Pt complains of positive blood cultures.  Was evaluated yesterday and blood cultures resulted as positive for E. coli and enterobacterales.  Was here yesterday for back pain.  Review of Systems  Positive: Back pain Negative: Injuries  Physical Exam  BP (!) 106/51   Pulse 78   Temp 99.5 F (37.5 C) (Oral)   Resp 16   LMP 11/12/2013   SpO2 99%  Gen:   Awake, no distress   Resp:  Normal effort  MSK:   Moves extremities without difficulty  Other:    Medical Decision Making  Medically screening exam initiated at 3:46 PM.  Appropriate orders placed.  Anner Baity was informed that the remainder of the evaluation will be completed by another provider, this initial triage assessment does not replace that evaluation, and the importance of remaining in the ED until their evaluation is complete.  Will order labs and inform charge that patient needs room   Dietrich Pates, PA-C 08/14/21 1549

## 2021-08-14 NOTE — ED Triage Notes (Signed)
Pt here after receiving call that blood cultures collected yesterday were positive; pt w no new complaints. Seen yesterday for lower back pain/fever, discharged.

## 2021-08-14 NOTE — Discharge Instructions (Signed)
Take tylenol 2 pills 4 times a day and motrin 4 pills 3 times a day.  Drink plenty of fluids.  Return for worsening shortness of breath, headache, confusion. Follow up with your family doctor.   

## 2021-08-14 NOTE — ED Notes (Signed)
MD Adela Lank made aware about pt temp. MD states its okay to DC

## 2021-08-14 NOTE — ED Provider Notes (Signed)
[facility] EMERGENCY DEPARTMENT Provider Note   CSN: [id number] Arrival date & time: 08/14/21  1535     History  No chief complaint on file.   Margaret Pace is a 56 y.o. female.  HPI     56 year old female with a history of diabetes, recent emergency department visits for nausea, vomiting and fever on July 4, fever and back pain on July 6 for which she had an MRI of lumbar spine which showed no evidence of infection, and a CT which showed findings consistent with likely pyelonephritis who had blood cultures drawn, returns to the emergency department today due to positive E. coli on blood cultures.  She reports she is actually feeling better today.  Denies continuing back pain, nausea, vomiting.  She does not have any lightheadedness.  She presented today as she received a call regarding the blood cultures and recommendation to return to the emergency department.  Started feeling sick on Tuesday Presented with back pain, nausea, vomiting, fever Was given IV antibiotics Was recently started on methocarbamol and naproxen for her back pain.  Denies black or bloody stools.  Past Medical History:  Diagnosis Date   Anemia    Pt reported blood transfusion after left leg fracture.   Carpal tunnel syndrome, bilateral    Diabetes mellitus without complication (HCC)      Home Medications Prior to Admission medications   Medication Sig Start Date End Date Taking? Authorizing Provider  atorvastatin (LIPITOR) 40 MG tablet TAKE 1 TABLET (40 MG TOTAL) BY MOUTH DAILY. 05/26/21 05/26/22 Yes Newlin, Odette Horns, MD  Dulaglutide (TRULICITY) 0.75 MG/0.5ML SOPN Inject 0.75 mg into the skin once a week. 05/26/21  Yes Newlin, Enobong, MD  glipiZIDE (GLUCOTROL) 10 MG tablet TAKE 1 TABLET (10 MG TOTAL) BY MOUTH 2 (TWO) TIMES DAILY BEFORE A MEAL. 05/26/21 05/26/22 Yes Newlin, Enobong, MD  lisinopril (ZESTRIL) 2.5 MG tablet TAKE 1 TABLET (2.5 MG TOTAL) BY MOUTH DAILY. 12/24/20 12/24/21  Yes Hoy Register, MD  loratadine (CLARITIN) 10 MG tablet Take 1 tablet (10 mg total) by mouth daily. 07/22/21  Yes Marcine Matar, MD  metFORMIN (GLUCOPHAGE) 1000 MG tablet TAKE 1 TABLET (1,000 MG TOTAL) BY MOUTH 2 (TWO) TIMES DAILY WITH A MEAL. 05/26/21 05/26/22 Yes Hoy Register, MD  naproxen (NAPROSYN) 500 MG tablet Take 1 tablet (500 mg total) by mouth 2 (two) times daily with a meal. 07/29/21  Yes Newlin, Enobong, MD  Blood Glucose Monitoring Suppl (TRUE METRIX METER) DEVI 1 each by Does not apply route 3 (three) times daily before meals. 09/25/15   Hoy Register, MD  cephALEXin (KEFLEX) 500 MG capsule Take 2 capsules (1,000 mg total) by mouth 2 (two) times daily for 7 days. 08/14/21 08/21/21  Melene Plan, DO  glucose blood test strip USE AS DIRECTED THREE TIMES DAILY 09/22/18   Hoy Register, MD  Insulin Pen Needle 31G X 5 MM MISC 1 each by Does not apply route at bedtime. 08/29/19   Hoy Register, MD  lidocaine (LIDODERM) 5 % Place 1 patch onto the skin daily. Remove & Discard patch within 12 hours or as directed by MD Patient not taking: Reported on 07/22/2021 05/30/19   Hoy Register, MD  methocarbamol (ROBAXIN) 500 MG tablet Take 1 tablet (500 mg total) by mouth 2 (two) times daily as needed for muscle spasms. Patient not taking: Reported on 08/15/2021 07/29/21   Hoy Register, MD  ondansetron (ZOFRAN) 4 MG tablet Take 1 tablet (4 mg total) by mouth every  6 (six) hours. 08/12/21   Prosperi, Christian H, PA-C  TRUEPLUS LANCETS 28G MISC 1 each by Does not apply route 3 (three) times daily before meals. 03/23/17   Hoy Register, MD  valACYclovir (VALTREX) 1000 MG tablet Take 1 tablet (1,000 mg total) by mouth 3 (three) times daily. Patient not taking: Reported on 07/22/2021 12/21/17   Hoy Register, MD      Allergies    Patient has no known allergies.    Review of Systems   Review of Systems  Physical Exam Updated Vital Signs BP 137/77   Pulse (!) 59   Temp 98.1 F (36.7 C)  (Oral)   Resp 19   LMP 11/12/2013   SpO2 99%  Physical Exam Vitals and nursing note reviewed.  Constitutional:      General: She is not in acute distress.    Appearance: She is well-developed. She is not diaphoretic.  HENT:     Head: Normocephalic and atraumatic.  Eyes:     Conjunctiva/sclera: Conjunctivae normal.  Cardiovascular:     Rate and Rhythm: Normal rate and regular rhythm.     Heart sounds: Normal heart sounds. No murmur heard.    No friction rub. No gallop.  Pulmonary:     Effort: Pulmonary effort is normal. No respiratory distress.     Breath sounds: Normal breath sounds. No wheezing or rales.  Abdominal:     General: There is no distension.     Palpations: Abdomen is soft.     Tenderness: There is no abdominal tenderness. There is no guarding.  Musculoskeletal:        General: No tenderness.     Cervical back: Normal range of motion.  Skin:    General: Skin is warm and dry.     Findings: No erythema or rash.  Neurological:     Mental Status: She is alert and oriented to person, place, and time.     ED Results / Procedures / Treatments   Labs (all labs ordered are listed, but only abnormal results are displayed) Labs Reviewed  COMPREHENSIVE METABOLIC PANEL - Abnormal; Notable for the following components:      Result Value   CO2 19 (*)    Glucose, Bld 150 (*)    Creatinine, Ser 1.03 (*)    Calcium 8.6 (*)    Total Protein 6.3 (*)    Albumin 2.6 (*)    AST 54 (*)    Alkaline Phosphatase 179 (*)    All other components within normal limits  CBC WITH DIFFERENTIAL/PLATELET - Abnormal; Notable for the following components:   RBC 3.28 (*)    Hemoglobin 9.4 (*)    HCT 29.5 (*)    Lymphs Abs 0.6 (*)    All other components within normal limits  LACTIC ACID, PLASMA - Abnormal; Notable for the following components:   Lactic Acid, Venous 2.0 (*)    All other components within normal limits  CBC - Abnormal; Notable for the following components:   WBC 3.2  (*)    RBC 3.11 (*)    Hemoglobin 9.0 (*)    HCT 27.7 (*)    Platelets 138 (*)    All other components within normal limits  BASIC METABOLIC PANEL - Abnormal; Notable for the following components:   CO2 20 (*)    Calcium 8.2 (*)    All other components within normal limits  CBG MONITORING, ED - Abnormal; Notable for the following components:   Glucose-Capillary 67 (*)  All other components within normal limits  CBG MONITORING, ED - Abnormal; Notable for the following components:   Glucose-Capillary 103 (*)    All other components within normal limits  CULTURE, BLOOD (ROUTINE X 2)  CULTURE, BLOOD (ROUTINE X 2)  LACTIC ACID, PLASMA  HIV ANTIBODY (ROUTINE TESTING W REFLEX)  CBG MONITORING, ED    EKG None  Radiology MR Lumbar Spine W Wo Contrast  Result Date: 08/13/2021 CLINICAL DATA:  Low back pain EXAM: MRI LUMBAR SPINE WITHOUT AND WITH CONTRAST TECHNIQUE: Multiplanar and multiecho pulse sequences of the lumbar spine were obtained without and with intravenous contrast. CONTRAST:  30mL GADAVIST GADOBUTROL 1 MMOL/ML IV SOLN COMPARISON:  None Available. FINDINGS: Segmentation:  Standard. Alignment:  Physiologic. Vertebrae:  No fracture, evidence of discitis, or bone lesion. Conus medullaris and cauda equina: Conus extends to the L1-2 level. Conus and cauda equina appear normal. Paraspinal and other soft tissues: Negative. Disc levels: L1-L2: Normal disc space and facet joints. No spinal canal stenosis. No neural foraminal stenosis. L2-L3: Normal disc space and facet joints. No spinal canal stenosis. No neural foraminal stenosis. L3-L4: Left asymmetric disc bulge. Left lateral recess narrowing without central spinal canal stenosis. No neural foraminal stenosis. L4-L5: Disc bulge with superimposed small central protrusion. No spinal canal stenosis. No neural foraminal stenosis. L5-S1: Small disc bulge. No spinal canal stenosis. No neural foraminal stenosis. Visualized sacrum: Normal.  IMPRESSION: 1. Mild lower lumbar degenerative disc disease without spinal canal or neural foraminal stenosis. 2. Left lateral recess narrowing at L3-L4 may serve as a source of L4 radiculopathy. Electronically Signed   By: Deatra Robinson M.D.   On: 08/13/2021 23:29   CT L-SPINE NO CHARGE  Result Date: 08/13/2021 CLINICAL DATA:  Back pain EXAM: CT LUMBAR SPINE WITHOUT CONTRAST TECHNIQUE: Multidetector CT imaging of the lumbar spine was performed without intravenous contrast administration. Multiplanar CT image reconstructions were also generated. RADIATION DOSE REDUCTION: This exam was performed according to the departmental dose-optimization program which includes automated exposure control, adjustment of the mA and/or kV according to patient size and/or use of iterative reconstruction technique. COMPARISON:  None Available. FINDINGS: Segmentation: 5 lumbar type vertebrae. Alignment: Normal. Vertebrae: No acute fracture or focal pathologic process. Paraspinal and other soft tissues: Negative. Disc levels: No spinal canal stenosis. IMPRESSION: No acute fracture or static subluxation of the lumbar spine. Electronically Signed   By: Deatra Robinson M.D.   On: 08/13/2021 20:25   CT Renal Stone Study  Result Date: 08/13/2021 CLINICAL DATA:  Low back pain and fever. Flank pain, stone disease suspected. EXAM: CT ABDOMEN AND PELVIS WITHOUT CONTRAST TECHNIQUE: Multidetector CT imaging of the abdomen and pelvis was performed following the standard protocol without IV contrast. RADIATION DOSE REDUCTION: This exam was performed according to the departmental dose-optimization program which includes automated exposure control, adjustment of the mA and/or kV according to patient size and/or use of iterative reconstruction technique. COMPARISON:  CT 12/20/2017 FINDINGS: Lower chest: No acute findings allowing for mild motion artifact. No pleural fluid. Hepatobiliary: Borderline hepatic steatosis. No evidence of focal liver  abnormality on this unenhanced exam. Multiple small calcified gallstones. Gallbladder physiologically distended. No pericholecystic fat stranding. No biliary dilatation. Pancreas: Fatty atrophy.  No ductal dilatation or inflammation. Spleen: Normal in size without focal abnormality. Adrenals/Urinary Tract: No adrenal nodule. No renal calculi. Perinephric edema about the mid lower right kidney. No hydronephrosis. Both ureters are decompressed. Occasional areas of scarring in the left kidney. No focal renal lesions are seen on  this unenhanced exam. Unremarkable appearance of the urinary bladder, no wall thickening or perivesicular edema. Stomach/Bowel: Decompressed stomach. No small bowel obstruction or inflammation. Normal appendix. Moderate volume of colonic stool without inflammation. Vascular/Lymphatic: Normal caliber abdominal aorta shattered small retroperitoneal lymph nodes are not enlarged by size criteria. Reproductive: Uterus and bilateral adnexa are unremarkable. Other: Small fat containing umbilical hernia. No free air, free fluid, or intra-abdominal fluid collection. Musculoskeletal: There are no acute or suspicious osseous abnormalities. IMPRESSION: 1. Perinephric edema about the mid lower right kidney, suspicious for pyelonephritis. Recommend correlation with urinalysis. 2. No renal stones or obstructive uropathy. 3. Cholelithiasis without CT findings of acute cholecystitis. Electronically Signed   By: Narda Rutherford M.D.   On: 08/13/2021 20:18    Procedures Procedures    Medications Ordered in ED Medications  cefTRIAXone (ROCEPHIN) 2 g in sodium chloride 0.9 % 100 mL IVPB (has no administration in time range)  enoxaparin (LOVENOX) injection 40 mg (has no administration in time range)  acetaminophen (TYLENOL) tablet 650 mg (has no administration in time range)    Or  acetaminophen (TYLENOL) suppository 650 mg (has no administration in time range)  ondansetron (ZOFRAN) tablet 4 mg (has no  administration in time range)    Or  ondansetron (ZOFRAN) injection 4 mg (has no administration in time range)  insulin aspart (novoLOG) injection 0-15 Units ( Subcutaneous Not Given 08/15/21 0844)  polyvinyl alcohol (LIQUIFILM TEARS) 1.4 % ophthalmic solution 2 drop (has no administration in time range)  lactated ringers bolus 1,000 mL (0 mLs Intravenous Stopped 08/14/21 2300)  cefTRIAXone (ROCEPHIN) 1 g in sodium chloride 0.9 % 100 mL IVPB (0 g Intravenous Stopped 08/14/21 2229)  lactated ringers bolus 1,000 mL (0 mLs Intravenous Stopped 08/15/21 0107)  cefTRIAXone (ROCEPHIN) 1 g in sodium chloride 0.9 % 100 mL IVPB (0 g Intravenous Stopped 08/15/21 0051)    ED Course/ Medical Decision Making/ A&P                           Medical Decision Making Amount and/or Complexity of Data Reviewed Labs: ordered.  Risk Decision regarding hospitalization.   56 year old female with a history of diabetes, recent emergency department visits for nausea, vomiting and fever on July 4, fever and back pain on July 6 for which she had an MRI of lumbar spine which showed no evidence of infection, and a CT which showed findings consistent with likely pyelonephritis who had blood cultures drawn, returns to the emergency department today due to positive E. coli on blood cultures.  She is well-appearing today, and asymptomatic at this time--however, her blood pressures are lower than her baseline with systolics running in the 90s.  She otherwise does not show signs of sepsis with a normal lactic acid, no tachycardia, afebrile today, improving renal function, normal mentation.  Given her bacteremia, relative hypotension, feel admission to the hospital is appropriate.  Her hemoglobin has slowly trended down from 12-9.  She denies any black or bloody stools.  We will continue to monitor.  Blood cultures were redrawn, and Rocephin was given.  Admitted to the hospital for continued care        Final Clinical Impression(s)  / ED Diagnoses Final diagnoses:  E coli bacteremia    Rx / DC Orders ED Discharge Orders     None         Alvira Monday, MD 08/15/21 1137

## 2021-08-14 NOTE — ED Notes (Signed)
Patient ambulatory to room.  

## 2021-08-15 DIAGNOSIS — D61818 Other pancytopenia: Secondary | ICD-10-CM

## 2021-08-15 DIAGNOSIS — I10 Essential (primary) hypertension: Secondary | ICD-10-CM

## 2021-08-15 DIAGNOSIS — D649 Anemia, unspecified: Secondary | ICD-10-CM

## 2021-08-15 DIAGNOSIS — R7881 Bacteremia: Secondary | ICD-10-CM

## 2021-08-15 DIAGNOSIS — B9620 Unspecified Escherichia coli [E. coli] as the cause of diseases classified elsewhere: Secondary | ICD-10-CM

## 2021-08-15 DIAGNOSIS — E1169 Type 2 diabetes mellitus with other specified complication: Secondary | ICD-10-CM

## 2021-08-15 DIAGNOSIS — N10 Acute tubulo-interstitial nephritis: Secondary | ICD-10-CM

## 2021-08-15 LAB — CBG MONITORING, ED
Glucose-Capillary: 87 mg/dL (ref 70–99)
Glucose-Capillary: 67 mg/dL — ABNORMAL LOW (ref 70–99)
Glucose-Capillary: 103 mg/dL — ABNORMAL HIGH (ref 70–99)

## 2021-08-15 LAB — CBC
HCT: 27.7 % — ABNORMAL LOW (ref 36.0–46.0)
Hemoglobin: 9 g/dL — ABNORMAL LOW (ref 12.0–15.0)
MCH: 28.9 pg (ref 26.0–34.0)
MCHC: 32.5 g/dL (ref 30.0–36.0)
MCV: 89.1 fL (ref 80.0–100.0)
Platelets: 138 10*3/uL — ABNORMAL LOW (ref 150–400)
RBC: 3.11 MIL/uL — ABNORMAL LOW (ref 3.87–5.11)
RDW: 14.3 % (ref 11.5–15.5)
WBC: 3.2 10*3/uL — ABNORMAL LOW (ref 4.0–10.5)
nRBC: 0 % (ref 0.0–0.2)

## 2021-08-15 LAB — GLUCOSE, CAPILLARY
Glucose-Capillary: 83 mg/dL (ref 70–99)
Glucose-Capillary: 144 mg/dL — ABNORMAL HIGH (ref 70–99)
Glucose-Capillary: 126 mg/dL — ABNORMAL HIGH (ref 70–99)

## 2021-08-15 LAB — FOLATE: Folate: 14 ng/mL (ref >5.9)

## 2021-08-15 LAB — IRON AND TIBC
Iron: 18 ug/dL — ABNORMAL LOW (ref 28–170)
Saturation Ratios: 11 % (ref 10.4–31.8)
TIBC: 164 ug/dL — ABNORMAL LOW (ref 250–450)
UIBC: 146 ug/dL

## 2021-08-15 LAB — BASIC METABOLIC PANEL
Anion gap: 10 (ref 5–15)
BUN: 10 mg/dL (ref 6–20)
CO2: 20 mmol/L — ABNORMAL LOW (ref 22–32)
Calcium: 8.2 mg/dL — ABNORMAL LOW (ref 8.9–10.3)
Chloride: 111 mmol/L (ref 98–111)
Creatinine, Ser: 0.87 mg/dL (ref 0.44–1.00)
GFR, Estimated: >60 mL/min (ref >60)
Glucose, Bld: 86 mg/dL (ref 70–99)
Potassium: 3.5 mmol/L (ref 3.5–5.1)
Sodium: 141 mmol/L (ref 135–145)

## 2021-08-15 LAB — FERRITIN: Ferritin: 203 ng/mL (ref 11–307)

## 2021-08-15 LAB — VITAMIN B12: Vitamin B-12: 296 pg/mL (ref 180–914)

## 2021-08-15 MED ORDER — INSULIN ASPART 100 UNIT/ML IJ SOLN
0.0000 [IU] | Freq: Three times a day (TID) | INTRAMUSCULAR | Status: DC
  Administered 2021-08-15: 2 [IU] via SUBCUTANEOUS
  Administered 2021-08-16: 3 [IU] via SUBCUTANEOUS

## 2021-08-15 MED ORDER — POLYVINYL ALCOHOL 1.4 % OP SOLN
2.0000 [drp] | OPHTHALMIC | Status: DC | PRN
  Administered 2021-08-16: 2 [drp] via OPHTHALMIC
  Filled 2021-08-15: qty 15

## 2021-08-15 MED ORDER — GLIPIZIDE 10 MG PO TABS
10.0000 mg | ORAL_TABLET | Freq: Two times a day (BID) | ORAL | Status: DC
  Filled 2021-08-15: qty 1

## 2021-08-15 NOTE — Hospital Course (Signed)
Margaret Pace is a 56 yo female with PMH DMII, anemia, carpal tunnel syndrome who presented with fevers and back pain.  She was recently seen in the ER on 08/12/2021 and 08/13/2021 with similar symptoms and was discharged home with Keflex for a UTI treatment. She was called to return back to the hospital after blood cultures grew E. coli. Sensitivities were reviewed and she was de-escalated down to cefadroxil at discharge to complete course.

## 2021-08-15 NOTE — Assessment & Plan Note (Addendum)
-   E.coli bacteremia in setting of acute pyelonephritis - continue Rocephin; await sensitivities

## 2021-08-15 NOTE — Assessment & Plan Note (Addendum)
-   CT shows perinephric edema around right kidney concerning for pyelonephritis.  Given her E. coli bacteremia, likely translocated from urinary source - Continue Rocephin in hospital; de-escalated to cefadroxil at discharge to complete course

## 2021-08-15 NOTE — Inpatient Diabetes Management (Signed)
Inpatient Diabetes Program Recommendations  AACE/ADA: New Consensus Statement on Inpatient Glycemic Control (2015)  Target Ranges:  Prepandial:   less than 140 mg/dL      Peak postprandial:   less than 180 mg/dL (1-2 hours)      Critically ill patients:  140 - 180 mg/dL   Lab Results  Component Value Date   GLUCAP 103 (H) 08/15/2021   HGBA1C 7.0 (A) 07/22/2021    Review of Glycemic Control  Latest Reference Range & Units 08/15/21 01:51 08/15/21 07:57 08/15/21 08:43  Glucose-Capillary 70 - 99 mg/dL 87 67 (L) 122 (H)   Diabetes history: DM 2 Outpatient Diabetes medications: Trulicity 0.75 mg QThursday, Glipizide 10 mg bid, metformin 1000 mg bid Current orders for Inpatient glycemic control:  Novolog 0-15 units tid  Glipizide 10 mg bid  Inpatient Diabetes Program Recommendations:    Discussed with Dr. Frederick Peers. Glucose 67 this am. Recommend d/c'ing glipizide for now and follow glucose trends on correction scale.  Thanks,  Christena Deem RN, MSN, BC-ADM Inpatient Diabetes Coordinator Team Pager [phone] (8a-5p)

## 2021-08-15 NOTE — ED Notes (Signed)
Patient denies pain and is resting comfortably.  

## 2021-08-15 NOTE — Assessment & Plan Note (Signed)
-   Suspected due to acute illness and bacteremia.  Platelet count normalized prior to discharge

## 2021-08-15 NOTE — Assessment & Plan Note (Addendum)
No signs or symptoms of acute blood loss -Stable and improving at discharge

## 2021-08-15 NOTE — Assessment & Plan Note (Addendum)
-   per uptodate: "Sodium-glucose cotransporter 2 (SGLT2) inhibitors, including canagliflozin, have been associated with an increased risk ofgenitourinary fungal infection(eg, vulvovaginal mycotic infection, vulvovaginal candidiasis, vulvovaginitis, candida balanitis, balanoposthitis) and, to a lesser extent, urinary tract infections, including severe cases of urinary tract infection with sepsis and pyelonephritis requiring hospitalization (Ref). These events are generally mild in intensity, respond to treatment, and do not lead to discontinuation" - therefore, continue Invokana at discharge and patient can further discuss outpatient if needed  -Continue home regimen at discharge

## 2021-08-15 NOTE — H&P (Signed)
History and Physical    Patient: Margaret Pace [id number]:[id number] DOB: 21-Oct-1965 DOA: 08/14/2021 DOS: the patient was seen and examined on 08/15/2021 PCP: Hoy Register, MD  Patient coming from: Home  Chief Complaint: Bacteremia  HPI: [name] is a 56 y.o. female with medical history significant of DM2, HTN.  Pt seen in ED yesterday with fever, back pain.  Diagnosed with R sided pyelonephritis, given dose of IV rocephin and started on PO keflex.  Today, pt with no new complaints; however, got phone call that her blood culture had come back positive, so returns to ED as instructed.  Of note, pt recently was started on Invokana just last month for treatment of her DM.  In Ed: Pt denies pain, no fever.  Lab work shows slight worsening of anemia compared to yesterday.  Started on rocephin.  Initial BP on soft side (90s systolic), so pt given IVF bolus.  BP now 121 systolic.   Review of Systems: As mentioned in the history of present illness. All other systems reviewed and are negative. Past Medical History:  Diagnosis Date   Anemia    Pt reported blood transfusion after left leg fracture.   Carpal tunnel syndrome, bilateral    Diabetes mellitus without complication [facility])    Past Surgical History:  Procedure Laterality Date   APPLICATION OF WOUND VAC Left 05/14/2014   Procedure: APPLICATION OF WOUND VAC;  Surgeon: Myrene Galas, MD;  Location: [facility] OR;  Service: Orthopedics;  Laterality: Left;   FASCIOTOMY Left 05/14/2014   Procedure: FOUR COMPARTMENT FASCIOTOMY;  Surgeon: Myrene Galas, MD;  Location: [facility] OR;  Service: Orthopedics;  Laterality: Left;   FRACTURE SURGERY     HARDWARE REMOVAL Left 10/19/2014   Procedure: HARDWARE REMOVAL LEFT TIBIA AND ANKLE;  Surgeon: Myrene Galas, MD;  Location: [facility] OR;  Service: Orthopedics;  Laterality: Left;   SECONDARY CLOSURE OF WOUND Left 05/17/2014   Procedure: WOUND CLOSURE LEFT LEG ;  Surgeon: Myrene Galas, MD;  Location: [facility] OR;   Service: Orthopedics;  Laterality: Left;   TIBIA IM NAIL INSERTION Left 05/14/2014   Procedure: INTRAMEDULLARY (IM) NAIL TIBIAL;  Surgeon: Myrene Galas, MD;  Location: [facility] OR;  Service: Orthopedics;  Laterality: Left;   Social History:  reports that she has never smoked. She has never used smokeless tobacco. She reports that she does not drink alcohol and does not use drugs.  No Known Allergies  Family History  Problem Relation Age of Onset   Diabetes Mother    Diabetes Sister    Diabetes Brother    Diabetes Sister    Diabetes Sister    Diabetes Brother     Prior to Admission medications   Medication Sig Start Date End Date Taking? Authorizing Provider  atorvastatin (LIPITOR) 40 MG tablet TAKE 1 TABLET (40 MG TOTAL) BY MOUTH DAILY. 05/26/21 05/26/22  Hoy Register, MD  Blood Glucose Monitoring Suppl (TRUE METRIX METER) DEVI 1 each by Does not apply route 3 (three) times daily before meals. 09/25/15   Hoy Register, MD  cephALEXin (KEFLEX) 500 MG capsule Take 2 capsules (1,000 mg total) by mouth 2 (two) times daily for 7 days. 08/14/21 08/21/21  Melene Plan, DO  Dulaglutide (TRULICITY) 0.75 MG/0.5ML SOPN Inject 0.75 mg into the skin once a week. 05/26/21   Hoy Register, MD  glipiZIDE (GLUCOTROL) 10 MG tablet TAKE 1 TABLET (10 MG TOTAL) BY MOUTH 2 (TWO) TIMES DAILY BEFORE A MEAL. 05/26/21 05/26/22  Hoy Register, MD  glucose blood test  strip USE AS DIRECTED THREE TIMES DAILY 09/22/18   Hoy Register, MD  Insulin Pen Needle 31G X 5 MM MISC 1 each by Does not apply route at bedtime. 08/29/19   Hoy Register, MD  lidocaine (LIDODERM) 5 % Place 1 patch onto the skin daily. Remove & Discard patch within 12 hours or as directed by MD Patient not taking: Reported on 07/22/2021 05/30/19   Hoy Register, MD  lisinopril (ZESTRIL) 2.5 MG tablet TAKE 1 TABLET (2.5 MG TOTAL) BY MOUTH DAILY. 12/24/20 12/24/21  Hoy Register, MD  loratadine (CLARITIN) 10 MG tablet Take 1 tablet (10 mg total) by mouth  daily. 07/22/21   Marcine Matar, MD  metFORMIN (GLUCOPHAGE) 1000 MG tablet TAKE 1 TABLET (1,000 MG TOTAL) BY MOUTH 2 (TWO) TIMES DAILY WITH A MEAL. 05/26/21 05/26/22  Hoy Register, MD  methocarbamol (ROBAXIN) 500 MG tablet Take 1 tablet (500 mg total) by mouth 2 (two) times daily as needed for muscle spasms. 07/29/21   Hoy Register, MD  naproxen (NAPROSYN) 500 MG tablet Take 1 tablet (500 mg total) by mouth 2 (two) times daily with a meal. 07/29/21   Newlin, Enobong, MD  ondansetron (ZOFRAN) 4 MG tablet Take 1 tablet (4 mg total) by mouth every 6 (six) hours. 08/12/21   Prosperi, Christian H, PA-C  TRUEPLUS LANCETS 28G MISC 1 each by Does not apply route 3 (three) times daily before meals. 03/23/17   Hoy Register, MD  valACYclovir (VALTREX) 1000 MG tablet Take 1 tablet (1,000 mg total) by mouth 3 (three) times daily. Patient not taking: Reported on 07/22/2021 12/21/17   Hoy Register, MD    Physical Exam: Vitals:   08/14/21 2220 08/14/21 2330 08/15/21 0000 08/15/21 0030  BP:  (!) 121/54 (!) 85/35 (!) 96/46  Pulse: (!) 58 63 62 63  Resp: 16 17 18 19   Temp:    98 F (36.7 C)  TempSrc:    Oral  SpO2: 99% 100% 99% 99%   Constitutional: NAD, calm, comfortable Eyes: PERRL, lids and conjunctivae normal ENMT: Mucous membranes are moist. Posterior pharynx clear of any exudate or lesions.Normal dentition.  Neck: normal, supple, no masses, no thyromegaly Respiratory: clear to auscultation bilaterally, no wheezing, no crackles. Normal respiratory effort. No accessory muscle use.  Cardiovascular: Regular rate and rhythm, no murmurs / rubs / gallops. No extremity edema. 2+ pedal pulses. No carotid bruits.  Abdomen: no tenderness, no masses palpated. No hepatosplenomegaly. Bowel sounds positive.  Musculoskeletal: no clubbing / cyanosis. No joint deformity upper and lower extremities. Good ROM, no contractures. Normal muscle tone.  Skin: no rashes, lesions, ulcers. No induration Neurologic: CN  2-12 grossly intact. Sensation intact, DTR normal. Strength 5/5 in all 4.  Psychiatric: Normal judgment and insight. Alert and oriented x 3. Normal mood.   Data Reviewed:       Latest Ref Rng & Units 08/14/2021    3:58 PM 08/13/2021    1:00 PM 08/12/2021    2:43 PM  CBC  WBC 4.0 - 10.5 K/uL 5.1  7.6  11.9   Hemoglobin 12.0 - 15.0 g/dL 9.4  10/13/2021  09.3   Hematocrit 36.0 - 46.0 % 29.5  31.9  37.4   Platelets 150 - 400 K/uL 151  165  193        Latest Ref Rng & Units 08/14/2021    3:58 PM 08/13/2021    1:00 PM 08/12/2021    2:43 PM  BMP  Glucose 70 - 99 mg/dL 10/13/2021  299  174   BUN 6 - 20 mg/dL 14  20  27    Creatinine 0.44 - 1.00 mg/dL  4.17  4.08   Sodium 135 - 145 mmol/L 136  132  138   Potassium 3.5 - 5.1 mmol/L 3.6  4.4  4.9   Chloride 98 - 111 mmol/L 105  103  103   CO2 22 - 32 mmol/L 19  18  20    Calcium 8.9 - 10.3 mg/dL 8.6  8.2  9.7    Urinalysis    Component Value Date/Time   COLORURINE YELLOW 08/13/2021 1548   APPEARANCEUR CLEAR 08/13/2021 1548   LABSPEC 1.026 08/13/2021 1548   PHURINE 5.0 08/13/2021 1548   GLUCOSEU >=500 (A) 08/13/2021 1548   HGBUR SMALL (A) 08/13/2021 1548   BILIRUBINUR NEGATIVE 08/13/2021 1548   BILIRUBINUR negative 07/22/2021 1215   BILIRUBINUR neg 02/27/2015 1429   KETONESUR 5 (A) 08/13/2021 1548   PROTEINUR NEGATIVE 08/13/2021 1548   UROBILINOGEN 0.2 07/22/2021 1215   UROBILINOGEN 0.2 05/14/2014 1017   NITRITE NEGATIVE 08/13/2021 1548   LEUKOCYTESUR NEGATIVE 08/13/2021 1548    Blood Culture    Component Value Date/Time   SDES BLOOD LEFT ANTECUBITAL 08/13/2021 1655   SDES BLOOD LEFT HAND 08/13/2021 1655   SPECREQUEST  08/13/2021 1655    BOTTLES DRAWN AEROBIC AND ANAEROBIC Blood Culture adequate volume   SPECREQUEST  08/13/2021 1655    BOTTLES DRAWN AEROBIC AND ANAEROBIC Blood Culture results may not be optimal due to an excessive volume of blood received in culture bottles   CULT GRAM NEGATIVE RODS 08/13/2021 1655   CULT  08/13/2021 1655     NO GROWTH < 24 HOURS Performed at [facility], [address] [place], [address] [place]    REPTSTATUS PENDING 08/13/2021 1655   REPTSTATUS PENDING 08/13/2021 1655     Assessment and Plan: * E coli bacteremia E.coli bacteremia in setting of acute pyelonephritis. Acute pyelonephritis occurs in setting of just having started Invokana last month. Rocephin Sensitivities pending Pt not septic at this point, BP looks okay at this point after IVF, ambulating to bathroom unassisted.  Afebrile today.  Suspect that either PO keflex she was put on is working, or the rocephin dose she was given in ED yesterday is working.  Acute pyelonephritis Demonstrated on CT and c/w HPI that brought her in to ED yesterday. Occurs in setting of having just started Invokana last month (med is known to increase risk for UTIs due to causing glucosuria, has "black box warning" for this). Treatment as per bacteremia above  Diabetes mellitus (HCC) Holding home metformin for the moment Stopping Invokana Continue glipizide and trulicity (med rec pending). Mod scale SSI AC  Hypertension Hold home BP meds due to initial soft BPs in ED (now resolved).      Advance Care Planning:   Code Status: Full Code  Consults: None  Family Communication: No family in room  Severity of Illness: The appropriate patient status for this patient is OBSERVATION. Observation status is judged to be reasonable and necessary in order to provide the required intensity of service to ensure the patient's safety. The patient's presenting symptoms, physical exam findings, and initial radiographic and laboratory data in the context of their medical condition is felt to place them at decreased risk for further clinical deterioration. Furthermore, it is anticipated that the patient will be medically stable for discharge from the hospital within 2 midnights of admission.   Author: 10/14/2021., DO 08/15/2021  1:35 AM  For on  call review [email or web address].

## 2021-08-15 NOTE — ED Notes (Signed)
Patient denies pain and is resting comfortably. Speaking on the phone. NAD

## 2021-08-15 NOTE — Progress Notes (Signed)
Progress Note    Margaret Pace   HGD:[id number]  DOB: 05/29/1965  DOA: 08/14/2021     0 PCP: Hoy Register, MD  Initial CC: fevers, back pain  Hospital Course: Margaret Pace is a 56 yo female with PMH DMII, anemia, carpal tunnel syndrome who presented with fevers and back pain.  She was recently seen in the ER on 08/12/2021 and 08/13/2021 with similar symptoms and was discharged home with Keflex for a UTI treatment. She was called to return back to the hospital after blood cultures grew E. coli.   Interval History:  Seen in ER this morning. Comfortable in bed. Understands we are treating for bacteremia and awaiting sensitivities.   Assessment and Plan: * E coli bacteremia - E.coli bacteremia in setting of acute pyelonephritis - continue Rocephin; await sensitivities   Acute pyelonephritis - CT shows perinephric edema around right kidney concerning for pyelonephritis.  Given her E. coli bacteremia, likely translocated from urinary source - Continue Rocephin and follow-up sensitivities of E. coli  Normocytic anemia No signs or symptoms of acute blood loss - continue trending for now  - possible decrease from acute illness  Diabetes mellitus (HCC) - per uptodate: "Sodium-glucose cotransporter 2 (SGLT2) inhibitors, including canagliflozin, have been associated with an increased risk of genitourinary fungal infection (eg, vulvovaginal mycotic infection, vulvovaginal candidiasis, vulvovaginitis, candida balanitis, balanoposthitis) and, to a lesser extent, urinary tract infections, including severe cases of urinary tract infection with sepsis and pyelonephritis requiring hospitalization (Ref). These events are generally mild in intensity, respond to treatment, and do not lead to discontinuation" - therefore, continue Invokana at discharge and patient can further discuss outpatient if needed  - continue SSI  Hypertension Hold home BP meds due to initial soft BPs in ED (now  resolved).  Pancytopenia (HCC) - For now, suspecting due to acute illness in setting of bacteremia - Continue trending CBC and if persists or worsens, will initiate further work-up   Old records reviewed in assessment of this patient  Antimicrobials: Rocephin 08/14/21 >> current  DVT prophylaxis:  enoxaparin (LOVENOX) injection 40 mg Start: 08/15/21 1000   Code Status:   Code Status: Full Code  Mobility Assessment (last 72 hours)     Mobility Assessment   No documentation.           Disposition Plan:  Home 1-2 days Status is: Inpt  Objective: Blood pressure (!) 144/90, pulse (!) 57, temperature 98.8 F (37.1 C), temperature source Oral, resp. rate 16, last menstrual period 11/12/2013, SpO2 100 %.  Examination:  Physical Exam Constitutional:      General: She is not in acute distress.    Appearance: Normal appearance.  HENT:     Head: Normocephalic and atraumatic.     Mouth/Throat:     Mouth: Mucous membranes are moist.  Eyes:     Extraocular Movements: Extraocular movements intact.  Cardiovascular:     Rate and Rhythm: Normal rate and regular rhythm.  Pulmonary:     Effort: Pulmonary effort is normal.     Breath sounds: Normal breath sounds.  Abdominal:     General: Bowel sounds are normal. There is no distension.     Palpations: Abdomen is soft.     Tenderness: There is no abdominal tenderness. There is no right CVA tenderness or left CVA tenderness.  Musculoskeletal:        General: Normal range of motion.     Cervical back: Normal range of motion and neck supple.  Skin:  General: Skin is warm and dry.  Neurological:     General: No focal deficit present.     Mental Status: She is alert and oriented to person, place, and time.  Psychiatric:        Mood and Affect: Mood normal.        Behavior: Behavior normal.      Consultants:    Procedures:    Data Reviewed: Results for orders placed or performed during the hospital encounter of 08/14/21  (from the past 24 hour(s))  Comprehensive metabolic panel     Status: Abnormal   Collection Time: 08/14/21  3:58 PM  Result Value Ref Range   Sodium 136 135 - 145 mmol/L   Potassium 3.6 3.5 - 5.1 mmol/L   Chloride 105 98 - 111 mmol/L   CO2 19 (L) 22 - 32 mmol/L   Glucose, Bld 150 (H) 70 - 99 mg/dL   BUN 14 6 - 20 mg/dL   Creatinine, Ser 8.67 (H) 0.44 - 1.00 mg/dL   Calcium 8.6 (L) 8.9 - 10.3 mg/dL   Total Protein 6.3 (L) 6.5 - 8.1 g/dL   Albumin 2.6 (L) 3.5 - 5.0 g/dL   AST 54 (H) 15 - 41 U/L   ALT 29 0 - 44 U/L   Alkaline Phosphatase 179 (H) 38 - 126 U/L   Total Bilirubin 0.7 0.3 - 1.2 mg/dL   GFR, Estimated >61 >95 mL/min   Anion gap 12 5 - 15  CBC with Differential     Status: Abnormal   Collection Time: 08/14/21  3:58 PM  Result Value Ref Range   WBC 5.1 4.0 - 10.5 K/uL   RBC 3.28 (L) 3.87 - 5.11 MIL/uL   Hemoglobin 9.4 (L) 12.0 - 15.0 g/dL   HCT 09.3 (L) 26.7 - 12.4 %   MCV 89.9 80.0 - 100.0 fL   MCH 28.7 26.0 - 34.0 pg   MCHC 31.9 30.0 - 36.0 g/dL   RDW 58.0 99.8 - 33.8 %   Platelets 151 150 - 400 K/uL   nRBC 0.0 0.0 - 0.2 %   Neutrophils Relative % 80 %   Neutro Abs 4.1 1.7 - 7.7 K/uL   Lymphocytes Relative 11 %   Lymphs Abs 0.6 (L) 0.7 - 4.0 K/uL   Monocytes Relative 8 %   Monocytes Absolute 0.4 0.1 - 1.0 K/uL   Eosinophils Relative 0 %   Eosinophils Absolute 0.0 0.0 - 0.5 K/uL   Basophils Relative 0 %   Basophils Absolute 0.0 0.0 - 0.1 K/uL   Immature Granulocytes 1 %   Abs Immature Granulocytes 0.03 0.00 - 0.07 K/uL  Lactic acid, plasma     Status: None   Collection Time: 08/14/21  9:20 PM  Result Value Ref Range   Lactic Acid, Venous 1.4 0.5 - 1.9 mmol/L  Blood culture (routine x 2)     Status: None (Preliminary result)   Collection Time: 08/14/21  9:20 PM   Specimen: BLOOD  Result Value Ref Range   Specimen Description BLOOD SITE NOT SPECIFIED    Special Requests      BOTTLES DRAWN AEROBIC AND ANAEROBIC Blood Culture adequate volume   Culture       NO GROWTH < 12 HOURS Performed at [facility], [address] [place], [place] [address]    Report Status PENDING   Blood culture (routine x 2)     Status: None (Preliminary result)   Collection Time: 08/14/21  9:23 PM  Specimen: BLOOD  Result Value Ref Range   Specimen Description BLOOD SITE NOT SPECIFIED    Special Requests      BOTTLES DRAWN AEROBIC AND ANAEROBIC Blood Culture adequate volume   Culture      NO GROWTH < 12 HOURS Performed at [facility], [address], [place], [place] [address]    Report Status PENDING   Lactic acid, plasma     Status: Abnormal   Collection Time: 08/14/21 10:48 PM  Result Value Ref Range   Lactic Acid, Venous 2.0 (HH) 0.5 - 1.9 mmol/L  CBG monitoring, ED     Status: None   Collection Time: 08/15/21  1:51 AM  Result Value Ref Range   Glucose-Capillary 87 70 - 99 mg/dL  CBC     Status: Abnormal   Collection Time: 08/15/21  3:38 AM  Result Value Ref Range   WBC 3.2 (L) 4.0 - 10.5 K/uL   RBC 3.11 (L) 3.87 - 5.11 MIL/uL   Hemoglobin 9.0 (L) 12.0 - 15.0 g/dL   HCT 96.7 (L) 89.3 - 81.0 %   MCV 89.1 80.0 - 100.0 fL   MCH 28.9 26.0 - 34.0 pg   MCHC 32.5 30.0 - 36.0 g/dL   RDW 17.5 10.2 - 58.5 %   Platelets 138 (L) 150 - 400 K/uL   nRBC 0.0 0.0 - 0.2 %  Basic metabolic panel     Status: Abnormal   Collection Time: 08/15/21  3:38 AM  Result Value Ref Range   Sodium 141 135 - 145 mmol/L   Potassium 3.5 3.5 - 5.1 mmol/L   Chloride 111 98 - 111 mmol/L   CO2 20 (L) 22 - 32 mmol/L   Glucose, Bld 86 70 - 99 mg/dL   BUN 10 6 - 20 mg/dL   Creatinine, Ser 2.77 0.44 - 1.00 mg/dL   Calcium 8.2 (L) 8.9 - 10.3 mg/dL   GFR, Estimated >82 >42 mL/min   Anion gap 10 5 - 15  CBG monitoring, ED     Status: Abnormal   Collection Time: 08/15/21  7:57 AM  Result Value Ref Range   Glucose-Capillary 67 (L) 70 - 99 mg/dL  CBG monitoring, ED     Status: Abnormal   Collection Time: 08/15/21  8:43 AM  Result Value Ref Range   Glucose-Capillary  103 (H) 70 - 99 mg/dL   Comment 1 Notify RN    Comment 2 Document in Chart   Glucose, capillary     Status: None   Collection Time: 08/15/21 12:55 PM  Result Value Ref Range   Glucose-Capillary 83 70 - 99 mg/dL    I have Reviewed nursing notes, Vitals, and Lab results since pt's last encounter. Pertinent lab results : see above I have ordered test including BMP, CBC, Mg I have reviewed the last note from staff over past 24 hours I have discussed pt's care plan and test results with nursing staff, case manager   LOS: 0 days   Lewie Chamber, MD [facility] 08/15/2021, 2:36 PM

## 2021-08-15 NOTE — Assessment & Plan Note (Signed)
Hold home BP meds due to initial soft BPs in ED (now resolved).

## 2021-08-15 NOTE — ED Notes (Signed)
Admitting provider at bedside updating patient.

## 2021-08-16 LAB — BASIC METABOLIC PANEL
Anion gap: 9 (ref 5–15)
BUN: 5 mg/dL — ABNORMAL LOW (ref 6–20)
CO2: 22 mmol/L (ref 22–32)
Calcium: 8.9 mg/dL (ref 8.9–10.3)
Chloride: 111 mmol/L (ref 98–111)
Creatinine, Ser: 0.84 mg/dL (ref 0.44–1.00)
GFR, Estimated: >60 mL/min (ref >60)
Glucose, Bld: 99 mg/dL (ref 70–99)
Potassium: 3.7 mmol/L (ref 3.5–5.1)
Sodium: 142 mmol/L (ref 135–145)

## 2021-08-16 LAB — CBC WITH DIFFERENTIAL/PLATELET
Abs Immature Granulocytes: 0.01 10*3/uL (ref 0.00–0.07)
Basophils Absolute: 0 10*3/uL (ref 0.0–0.1)
Basophils Relative: 0 %
Eosinophils Absolute: 0 10*3/uL (ref 0.0–0.5)
Eosinophils Relative: 1 %
HCT: 29.5 % — ABNORMAL LOW (ref 36.0–46.0)
Hemoglobin: 9.6 g/dL — ABNORMAL LOW (ref 12.0–15.0)
Immature Granulocytes: 0 %
Lymphocytes Relative: 26 %
Lymphs Abs: 0.9 10*3/uL (ref 0.7–4.0)
MCH: 29 pg (ref 26.0–34.0)
MCHC: 32.5 g/dL (ref 30.0–36.0)
MCV: 89.1 fL (ref 80.0–100.0)
Monocytes Absolute: 0.3 10*3/uL (ref 0.1–1.0)
Monocytes Relative: 9 %
Neutro Abs: 2.2 10*3/uL (ref 1.7–7.7)
Neutrophils Relative %: 64 %
Platelets: 165 10*3/uL (ref 150–400)
RBC: 3.31 MIL/uL — ABNORMAL LOW (ref 3.87–5.11)
RDW: 14.2 % (ref 11.5–15.5)
WBC: 3.4 10*3/uL — ABNORMAL LOW (ref 4.0–10.5)
nRBC: 0 % (ref 0.0–0.2)

## 2021-08-16 LAB — CULTURE, BLOOD (ROUTINE X 2): Special Requests: ADEQUATE

## 2021-08-16 LAB — GLUCOSE, CAPILLARY
Glucose-Capillary: 94 mg/dL (ref 70–99)
Glucose-Capillary: 166 mg/dL — ABNORMAL HIGH (ref 70–99)

## 2021-08-16 LAB — MAGNESIUM: Magnesium: 1.9 mg/dL (ref 1.7–2.4)

## 2021-08-16 LAB — HIV ANTIBODY (ROUTINE TESTING W REFLEX): HIV Screen 4th Generation wRfx: NONREACTIVE

## 2021-08-16 MED ORDER — VITAMIN B-12 1000 MCG PO TABS
1000.0000 ug | ORAL_TABLET | Freq: Every day | ORAL | Status: DC
  Administered 2021-08-16: 1000 ug via ORAL
  Filled 2021-08-16: qty 1

## 2021-08-16 MED ORDER — CEFADROXIL 500 MG PO CAPS
500.0000 mg | ORAL_CAPSULE | Freq: Two times a day (BID) | ORAL | Status: DC
Start: 2021-08-16 — End: 2021-08-16
  Administered 2021-08-16: 500 mg via ORAL
  Filled 2021-08-16: qty 1

## 2021-08-16 MED ORDER — CEFADROXIL 500 MG PO CAPS: 500.0000 mg | ORAL_CAPSULE | Freq: Two times a day (BID) | ORAL | 0 refills | Status: AC

## 2021-08-16 NOTE — Progress Notes (Signed)
Izetta Dakin to be D/C'd  per MD order.  Discussed with the patient and all questions fully answered.  VSS, Skin clean, dry and intact without evidence of skin break down, no evidence of skin tears noted.  IV catheter discontinued intact. Site without signs and symptoms of complications. Dressing and pressure applied.  An After Visit Summary was printed and given to the patient. Patient received prescription.  D/c education completed with patient/family including follow up instructions, medication list, d/c activities limitations if indicated, with other d/c instructions as indicated by MD - patient able to verbalize understanding, all questions fully answered.   Patient instructed to return to ED, call 911, or call MD for any changes in condition.   Patient to be escorted via WC, and D/C home via private auto.

## 2021-08-16 NOTE — Discharge Summary (Signed)
Physician Discharge Summary   Margaret Pace PPI:[id number] DOB: 1965/02/18 DOA: 08/14/2021  PCP: Hoy Register, MD  Admit date: 08/14/2021 Discharge date: 08/16/2021   Admitted From: Home Disposition:  Home Discharging physician: Lewie Chamber, MD  Recommendations for Outpatient Follow-up:  Can discuss whether or not to continue Invokana at follow up  Home Health:  Equipment/Devices:   Discharge Condition: stable CODE STATUS: Full Diet recommendation:  Diet Orders (From admission, onward)     Start     Ordered   08/16/21 0000  Diet Carb Modified        08/16/21 1147   08/15/21 0134  Diet Carb Modified Fluid consistency: Thin; Room service appropriate? Yes  Diet effective now       Question Answer Comment  Diet-HS Snack? Nothing   Calorie Level Medium 1600-2000   Fluid consistency: Thin   Room service appropriate? Yes      08/15/21 0134            Hospital Course: Margaret Pace is a 56 yo female with PMH DMII, anemia, carpal tunnel syndrome who presented with fevers and back pain.  She was recently seen in the ER on 08/12/2021 and 08/13/2021 with similar symptoms and was discharged home with Keflex for a UTI treatment. She was called to return back to the hospital after blood cultures grew E. coli. Sensitivities were reviewed and she was de-escalated down to cefadroxil at discharge to complete course.  Assessment and Plan: * E coli bacteremia - E.coli bacteremia in setting of acute pyelonephritis - continue Rocephin; await sensitivities   Acute pyelonephritis - CT shows perinephric edema around right kidney concerning for pyelonephritis.  Given her E. coli bacteremia, likely translocated from urinary source - Continue Rocephin in hospital; de-escalated to cefadroxil at discharge to complete course  Normocytic anemia No signs or symptoms of acute blood loss -Stable and improving at discharge  Diabetes mellitus (HCC) - per uptodate: "Sodium-glucose cotransporter  2 (SGLT2) inhibitors, including canagliflozin, have been associated with an increased risk of genitourinary fungal infection (eg, vulvovaginal mycotic infection, vulvovaginal candidiasis, vulvovaginitis, candida balanitis, balanoposthitis) and, to a lesser extent, urinary tract infections, including severe cases of urinary tract infection with sepsis and pyelonephritis requiring hospitalization (Ref). These events are generally mild in intensity, respond to treatment, and do not lead to discontinuation" - therefore, continue Invokana at discharge and patient can further discuss outpatient if needed  -Continue home regimen at discharge  Hypertension Hold home BP meds due to initial soft BPs in ED (now resolved).  Pancytopenia (HCC) - Suspected due to acute illness and bacteremia.  Platelet count normalized prior to discharge       The patient's chronic medical conditions were treated accordingly per the patient's home medication regimen except as noted.  On day of discharge, patient was felt deemed stable for discharge. Patient/family member advised to call PCP or come back to ER if needed.   Principal Diagnosis: E coli bacteremia  Discharge Diagnoses: Active Hospital Problems   Diagnosis Date Noted   E coli bacteremia 08/14/2021    Priority: High   Acute pyelonephritis 08/14/2021    Priority: High   Diabetes mellitus (HCC) 11/24/2006    Priority: Medium    Normocytic anemia 11/24/2006    Priority: Medium    Hypertension 06/22/2017    Priority: Low   Pancytopenia (HCC) 08/15/2021    Resolved Hospital Problems  No resolved problems to display.     Discharge Instructions     Diet Carb Modified  Complete by: As directed    Increase activity slowly   Complete by: As directed       Allergies as of 08/16/2021   No Known Allergies      Medication List     STOP taking these medications    cephALEXin 500 MG capsule Commonly known as: KEFLEX   lidocaine 5 % Commonly  known as: Lidoderm   methocarbamol 500 MG tablet Commonly known as: ROBAXIN   valACYclovir 1000 MG tablet Commonly known as: VALTREX       TAKE these medications    atorvastatin 40 MG tablet Commonly known as: LIPITOR Tome 1 tableta (40 mg en total) por va oral diariamente. (TAKE 1 TABLET (40 MG TOTAL) BY MOUTH DAILY.)   cefadroxil 500 MG capsule Commonly known as: DURICEF Take 1 capsule (500 mg total) by mouth 2 (two) times daily for 10 days.   glipiZIDE 10 MG tablet Commonly known as: GLUCOTROL TAKE 1 TABLET (10 MG TOTAL) BY MOUTH 2 (TWO) TIMES DAILY BEFORE A MEAL.   glucose blood test strip USE AS DIRECTED THREE TIMES DAILY   Insulin Pen Needle 31G X 5 MM Misc 1 each by Does not apply route at bedtime.   lisinopril 2.5 MG tablet Commonly known as: ZESTRIL Tome 1 tableta (2.5 mg en total) por va oral diariamente. (TAKE 1 TABLET (2.5 MG TOTAL) BY MOUTH DAILY.)   loratadine 10 MG tablet Commonly known as: CLARITIN Tome 1 tableta (10 mg en total) por va oral diariamente. (Take 1 tablet (10 mg total) by mouth daily.)   metFORMIN 1000 MG tablet Commonly known as: GLUCOPHAGE TAKE 1 TABLET (1,000 MG TOTAL) BY MOUTH 2 (TWO) TIMES DAILY WITH A MEAL.   naproxen 500 MG tablet Commonly known as: Naprosyn Take 1 tablet (500 mg total) by mouth 2 (two) times daily with a meal.   ondansetron 4 MG tablet Commonly known as: ZOFRAN Take 1 tablet (4 mg total) by mouth every 6 (six) hours.   True Metrix Meter Devi 1 each by Does not apply route 3 (three) times daily before meals.   TRUEplus Lancets 28G Misc 1 each by Does not apply route 3 (three) times daily before meals.   Trulicity 0.75 MG/0.5ML Sopn Generic drug: Dulaglutide Inject 0.75 mg into the skin once a week.        No Known Allergies  Consultations:   Procedures:   Discharge Exam: BP 125/88 (BP Location: Left Arm)   Pulse (!) 51   Temp 98.5 F (36.9 C) (Oral)   Resp 16   LMP 11/12/2013    SpO2 100%  Physical Exam Constitutional:      General: She is not in acute distress.    Appearance: Normal appearance.  HENT:     Head: Normocephalic and atraumatic.     Mouth/Throat:     Mouth: Mucous membranes are moist.  Eyes:     Extraocular Movements: Extraocular movements intact.  Cardiovascular:     Rate and Rhythm: Normal rate and regular rhythm.  Pulmonary:     Effort: Pulmonary effort is normal.     Breath sounds: Normal breath sounds.  Abdominal:     General: Bowel sounds are normal. There is no distension.     Palpations: Abdomen is soft.     Tenderness: There is no abdominal tenderness. There is no right CVA tenderness or left CVA tenderness.  Musculoskeletal:        General: Normal range of motion.     Cervical  back: Normal range of motion and neck supple.  Skin:    General: Skin is warm and dry.  Neurological:     General: No focal deficit present.     Mental Status: She is alert and oriented to person, place, and time.  Psychiatric:        Mood and Affect: Mood normal.        Behavior: Behavior normal.      The results of significant diagnostics from this hospitalization (including imaging, microbiology, ancillary and laboratory) are listed below for reference.   Microbiology: Recent Results (from the past 240 hour(s))  SARS Coronavirus 2 by RT PCR (hospital order, performed in [facility] hospital lab) *cepheid single result test* Anterior Nasal Swab     Status: None   Collection Time: 08/12/21  3:11 PM   Specimen: Anterior Nasal Swab  Result Value Ref Range Status   SARS Coronavirus 2 by RT PCR NEGATIVE NEGATIVE Final    Comment: (NOTE) SARS-CoV-2 target nucleic acids are NOT DETECTED.  The SARS-CoV-2 RNA is generally detectable in upper and lower respiratory specimens during the acute phase of infection. The lowest concentration of SARS-CoV-2 viral copies this assay can detect is 250 copies / mL. A negative result does not preclude SARS-CoV-2  infection and should not be used as the sole basis for treatment or other patient management decisions.  A negative result may occur with improper specimen collection / handling, submission of specimen other than nasopharyngeal swab, presence of viral mutation(s) within the areas targeted by this assay, and inadequate number of viral copies (<250 copies / mL). A negative result must be combined with clinical observations, patient history, and epidemiological information.  Fact Sheet for Patients:   RoadLapTop.co.za  Fact Sheet for Healthcare Providers: http://kim-miller.com/  This test is not yet approved or  cleared by the [place] FDA and has been authorized for detection and/or diagnosis of SARS-CoV-2 by FDA under an Emergency Use Authorization (EUA).  This EUA will remain in effect (meaning this test can be used) for the duration of the COVID-19 declaration under Section 564(b)(1) of the Act, 21 U.S.C. section 360bbb-3(b)(1), unless the authorization is terminated or revoked sooner.  Performed at [facility], [address], [place], [place] [address]   Culture, blood (routine x 2)     Status: Abnormal   Collection Time: 08/13/21  4:55 PM   Specimen: BLOOD  Result Value Ref Range Status   Specimen Description BLOOD LEFT ANTECUBITAL  Final   Special Requests   Final    BOTTLES DRAWN AEROBIC AND ANAEROBIC Blood Culture adequate volume   Culture  Setup Time   Final    GRAM NEGATIVE RODS AEROBIC BOTTLE ONLY Organism ID to follow CRITICAL RESULT CALLED TO, READ BACK BY AND VERIFIED WITH: RN Dorian Furnace [phone] [id number] MLM Performed at [facility], [address], [place], [place] [address]    Culture ESCHERICHIA COLI (A)  Final   Report Status 08/16/2021 FINAL  Final   Organism ID, Bacteria ESCHERICHIA COLI  Final      Susceptibility   Escherichia coli - MIC*    AMPICILLIN >=32 RESISTANT Resistant     CEFAZOLIN <=4  SENSITIVE Sensitive     CEFEPIME <=0.12 SENSITIVE Sensitive     CEFTAZIDIME <=1 SENSITIVE Sensitive     CEFTRIAXONE <=0.25 SENSITIVE Sensitive     CIPROFLOXACIN <=0.25 SENSITIVE Sensitive     GENTAMICIN <=1 SENSITIVE Sensitive     IMIPENEM <=0.25 SENSITIVE  Sensitive     TRIMETH/SULFA >=320 RESISTANT Resistant     AMPICILLIN/SULBACTAM 16 INTERMEDIATE Intermediate     PIP/TAZO <=4 SENSITIVE Sensitive     * ESCHERICHIA COLI  Culture, blood (routine x 2)     Status: None (Preliminary result)   Collection Time: 08/13/21  4:55 PM   Specimen: BLOOD LEFT HAND  Result Value Ref Range Status   Specimen Description BLOOD LEFT HAND  Final   Special Requests   Final    BOTTLES DRAWN AEROBIC AND ANAEROBIC Blood Culture results may not be optimal due to an excessive volume of blood received in culture bottles   Culture   Final    NO GROWTH 3 DAYS Performed at [facility], [address], [place], [place] [address]    Report Status PENDING  Incomplete  Blood Culture ID Panel (Reflexed)     Status: Abnormal   Collection Time: 08/13/21  4:55 PM  Result Value Ref Range Status   Enterococcus faecalis NOT DETECTED NOT DETECTED Final   Enterococcus Faecium NOT DETECTED NOT DETECTED Final   Listeria monocytogenes NOT DETECTED NOT DETECTED Final   Staphylococcus species NOT DETECTED NOT DETECTED Final   Staphylococcus aureus (BCID) NOT DETECTED NOT DETECTED Final   Staphylococcus epidermidis NOT DETECTED NOT DETECTED Final   Staphylococcus lugdunensis NOT DETECTED NOT DETECTED Final   Streptococcus species NOT DETECTED NOT DETECTED Final   Streptococcus agalactiae NOT DETECTED NOT DETECTED Final   Streptococcus pneumoniae NOT DETECTED NOT DETECTED Final   Streptococcus pyogenes NOT DETECTED NOT DETECTED Final   A.calcoaceticus-baumannii NOT DETECTED NOT DETECTED Final   Bacteroides fragilis NOT DETECTED NOT DETECTED Final   Enterobacterales DETECTED (A) NOT DETECTED Final    Comment:  Enterobacterales represent a large order of gram negative bacteria, not a single organism. CRITICAL RESULT CALLED TO, READ BACK BY AND VERIFIED WITH: RN Dorian Furnace [phone] MLM    Enterobacter cloacae complex NOT DETECTED NOT DETECTED Final   Escherichia coli DETECTED (A) NOT DETECTED Final    Comment: CRITICAL RESULT CALLED TO, READ BACK BY AND VERIFIED WITH: RN Dorian Furnace [phone] MLM    Klebsiella aerogenes NOT DETECTED NOT DETECTED Final   Klebsiella oxytoca NOT DETECTED NOT DETECTED Final   Klebsiella pneumoniae NOT DETECTED NOT DETECTED Final   Proteus species NOT DETECTED NOT DETECTED Final   Salmonella species NOT DETECTED NOT DETECTED Final   Serratia marcescens NOT DETECTED NOT DETECTED Final   Haemophilus influenzae NOT DETECTED NOT DETECTED Final   Neisseria meningitidis NOT DETECTED NOT DETECTED Final   Pseudomonas aeruginosa NOT DETECTED NOT DETECTED Final   Stenotrophomonas maltophilia NOT DETECTED NOT DETECTED Final   Candida albicans NOT DETECTED NOT DETECTED Final   Candida auris NOT DETECTED NOT DETECTED Final   Candida glabrata NOT DETECTED NOT DETECTED Final   Candida krusei NOT DETECTED NOT DETECTED Final   Candida parapsilosis NOT DETECTED NOT DETECTED Final   Candida tropicalis NOT DETECTED NOT DETECTED Final   Cryptococcus neoformans/gattii NOT DETECTED NOT DETECTED Final   CTX-M ESBL NOT DETECTED NOT DETECTED Final   Carbapenem resistance IMP NOT DETECTED NOT DETECTED Final   Carbapenem resistance KPC NOT DETECTED NOT DETECTED Final   Carbapenem resistance NDM NOT DETECTED NOT DETECTED Final   Carbapenem resist OXA 48 LIKE NOT DETECTED NOT DETECTED Final   Carbapenem resistance VIM NOT DETECTED NOT DETECTED Final    Comment: Performed at [facility], [address] [place], [place] [address]  Blood culture (routine x 2)     Status: None (Preliminary result)   Collection Time: 08/14/21  9:20 PM   Specimen: BLOOD  Result Value Ref Range Status    Specimen Description BLOOD SITE NOT SPECIFIED  Final   Special Requests   Final    BOTTLES DRAWN AEROBIC AND ANAEROBIC Blood Culture adequate volume   Culture   Final    NO GROWTH 2 DAYS Performed at [facility], [address], [place], [place] [address]    Report Status PENDING  Incomplete  Blood culture (routine x 2)     Status: None (Preliminary result)   Collection Time: 08/14/21  9:23 PM   Specimen: BLOOD  Result Value Ref Range Status   Specimen Description BLOOD SITE NOT SPECIFIED  Final   Special Requests   Final    BOTTLES DRAWN AEROBIC AND ANAEROBIC Blood Culture adequate volume   Culture   Final    NO GROWTH 2 DAYS Performed at [facility], [address], [place], [place] [address]    Report Status PENDING  Incomplete     Labs: BNP (last 3 results) No results for input(s): "BNP" in the last 8760 hours. Basic Metabolic Panel: Recent Labs  Lab 08/12/21 1443 08/13/21 1300 08/14/21 1558 08/15/21 0338 08/16/21 0106  NA 138 132* 136 141 142  K 4.9 4.4 3.6 3.5 3.7  CL 103 103 105 111 111  CO2 20* 18* 19* 20* 22  GLUCOSE 174* 304* 150* 86 99  BUN 27* 20 14 10  5*  CREATININE 1.36* 1.28* 1.03* 0.87 0.84  CALCIUM 9.7 8.2* 8.6* 8.2* 8.9  MG  --   --   --   --  1.9   Liver Function Tests: Recent Labs  Lab 08/12/21 1443 08/13/21 1300 08/14/21 1558  AST 21 20 54*  ALT 16 16 29   ALKPHOS 130* 119 179*  BILITOT 0.9 0.7 0.7  PROT 8.2* 6.7 6.3*  ALBUMIN 3.5 2.7* 2.6*   Recent Labs  Lab 08/12/21 1443  LIPASE 30   No results for input(s): "AMMONIA" in the last 168 hours. CBC: Recent Labs  Lab 08/12/21 1443 08/13/21 1300 08/14/21 1558 08/15/21 0338 08/16/21 0106  WBC 11.9* 7.6 5.1 3.2* 3.4*  NEUTROABS  --  6.6 4.1  --  2.2  HGB 12.2 10.2* 9.4* 9.0* 9.6*  HCT 37.4 31.9* 29.5* 27.7* 29.5*  MCV 90.8 88.9 89.9 89.1 89.1  PLT 193 165 151 138* 165   Cardiac Enzymes: No results for input(s): "CKTOTAL", "CKMB", "CKMBINDEX", "TROPONINI" in the  last 168 hours. BNP: Invalid input(s): "POCBNP" CBG: Recent Labs  Lab 08/15/21 1255 08/15/21 1614 08/15/21 2035 08/16/21 0759 08/16/21 1133  GLUCAP 83 144* 126* 94 166*   D-Dimer No results for input(s): "DDIMER" in the last 72 hours. Hgb A1c No results for input(s): "HGBA1C" in the last 72 hours. Lipid Profile No results for input(s): "CHOL", "HDL", "LDLCALC", "TRIG", "CHOLHDL", "LDLDIRECT" in the last 72 hours. Thyroid function studies No results for input(s): "TSH", "T4TOTAL", "T3FREE", "THYROIDAB" in the last 72 hours.  Invalid input(s): "FREET3" Anemia work up Recent Labs    08/15/21 1435  VITAMINB12 296  FOLATE 14.0  FERRITIN 203  TIBC 164*  IRON 18*   Urinalysis    Component Value Date/Time   COLORURINE YELLOW 08/13/2021 1548   APPEARANCEUR CLEAR 08/13/2021 1548   LABSPEC 1.026 08/13/2021 1548   PHURINE 5.0 08/13/2021 1548   GLUCOSEU >=500 (A) 08/13/2021 1548   HGBUR  SMALL (A) 08/13/2021 1548   BILIRUBINUR NEGATIVE 08/13/2021 1548   BILIRUBINUR negative 07/22/2021 1215   BILIRUBINUR neg 02/27/2015 1429   KETONESUR 5 (A) 08/13/2021 1548   PROTEINUR NEGATIVE 08/13/2021 1548   UROBILINOGEN 0.2 07/22/2021 1215   UROBILINOGEN 0.2 05/14/2014 1017   NITRITE NEGATIVE 08/13/2021 1548   LEUKOCYTESUR NEGATIVE 08/13/2021 1548   Sepsis Labs Recent Labs  Lab 08/13/21 1300 08/14/21 1558 08/15/21 0338 08/16/21 0106  WBC 7.6 5.1 3.2* 3.4*   Microbiology Recent Results (from the past 240 hour(s))  SARS Coronavirus 2 by RT PCR (hospital order, performed in [facility] hospital lab) *cepheid single result test* Anterior Nasal Swab     Status: None   Collection Time: 08/12/21  3:11 PM   Specimen: Anterior Nasal Swab  Result Value Ref Range Status   SARS Coronavirus 2 by RT PCR NEGATIVE NEGATIVE Final    Comment: (NOTE) SARS-CoV-2 target nucleic acids are NOT DETECTED.  The SARS-CoV-2 RNA is generally detectable in upper and lower respiratory specimens during  the acute phase of infection. The lowest concentration of SARS-CoV-2 viral copies this assay can detect is 250 copies / mL. A negative result does not preclude SARS-CoV-2 infection and should not be used as the sole basis for treatment or other patient management decisions.  A negative result may occur with improper specimen collection / handling, submission of specimen other than nasopharyngeal swab, presence of viral mutation(s) within the areas targeted by this assay, and inadequate number of viral copies (<250 copies / mL). A negative result must be combined with clinical observations, patient history, and epidemiological information.  Fact Sheet for Patients:   RoadLapTop.co.za  Fact Sheet for Healthcare Providers: http://kim-miller.com/  This test is not yet approved or  cleared by the [place] FDA and has been authorized for detection and/or diagnosis of SARS-CoV-2 by FDA under an Emergency Use Authorization (EUA).  This EUA will remain in effect (meaning this test can be used) for the duration of the COVID-19 declaration under Section 564(b)(1) of the Act, 21 U.S.C. section 360bbb-3(b)(1), unless the authorization is terminated or revoked sooner.  Performed at [facility], [address] [place], [place] [address]   Culture, blood (routine x 2)     Status: Abnormal   Collection Time: 08/13/21  4:55 PM   Specimen: BLOOD  Result Value Ref Range Status   Specimen Description BLOOD LEFT ANTECUBITAL  Final   Special Requests   Final    BOTTLES DRAWN AEROBIC AND ANAEROBIC Blood Culture adequate volume   Culture  Setup Time   Final    GRAM NEGATIVE RODS AEROBIC BOTTLE ONLY Organism ID to follow CRITICAL RESULT CALLED TO, READ BACK BY AND VERIFIED WITH: RN Dorian Furnace [phone] [id number] Performed at [facility], [address], [place], [place] [address]    Culture ESCHERICHIA COLI (A)  Final   Report Status 08/16/2021  FINAL  Final   Organism ID, Bacteria ESCHERICHIA COLI  Final      Susceptibility   Escherichia coli - MIC*    AMPICILLIN >=32 RESISTANT Resistant     CEFAZOLIN <=4 SENSITIVE Sensitive     CEFEPIME <=0.12 SENSITIVE Sensitive     CEFTAZIDIME <=1 SENSITIVE Sensitive     CEFTRIAXONE <=0.25 SENSITIVE Sensitive     CIPROFLOXACIN <=0.25 SENSITIVE Sensitive     GENTAMICIN <=1 SENSITIVE Sensitive     IMIPENEM <=0.25 SENSITIVE Sensitive     TRIMETH/SULFA >=320 RESISTANT Resistant  AMPICILLIN/SULBACTAM 16 INTERMEDIATE Intermediate     PIP/TAZO <=4 SENSITIVE Sensitive     * ESCHERICHIA COLI  Culture, blood (routine x 2)     Status: None (Preliminary result)   Collection Time: 08/13/21  4:55 PM   Specimen: BLOOD LEFT HAND  Result Value Ref Range Status   Specimen Description BLOOD LEFT HAND  Final   Special Requests   Final    BOTTLES DRAWN AEROBIC AND ANAEROBIC Blood Culture results may not be optimal due to an excessive volume of blood received in culture bottles   Culture   Final    NO GROWTH 3 DAYS Performed at [facility], [address], [place], [place] [address]    Report Status PENDING  Incomplete  Blood Culture ID Panel (Reflexed)     Status: Abnormal   Collection Time: 08/13/21  4:55 PM  Result Value Ref Range Status   Enterococcus faecalis NOT DETECTED NOT DETECTED Final   Enterococcus Faecium NOT DETECTED NOT DETECTED Final   Listeria monocytogenes NOT DETECTED NOT DETECTED Final   Staphylococcus species NOT DETECTED NOT DETECTED Final   Staphylococcus aureus (BCID) NOT DETECTED NOT DETECTED Final   Staphylococcus epidermidis NOT DETECTED NOT DETECTED Final   Staphylococcus lugdunensis NOT DETECTED NOT DETECTED Final   Streptococcus species NOT DETECTED NOT DETECTED Final   Streptococcus agalactiae NOT DETECTED NOT DETECTED Final   Streptococcus pneumoniae NOT DETECTED NOT DETECTED Final   Streptococcus pyogenes NOT DETECTED NOT DETECTED Final    A.calcoaceticus-baumannii NOT DETECTED NOT DETECTED Final   Bacteroides fragilis NOT DETECTED NOT DETECTED Final   Enterobacterales DETECTED (A) NOT DETECTED Final    Comment: Enterobacterales represent a large order of gram negative bacteria, not a single organism. CRITICAL RESULT CALLED TO, READ BACK BY AND VERIFIED WITH: RN Dorian Furnace [phone] MLM    Enterobacter cloacae complex NOT DETECTED NOT DETECTED Final   Escherichia coli DETECTED (A) NOT DETECTED Final    Comment: CRITICAL RESULT CALLED TO, READ BACK BY AND VERIFIED WITH: RN Dorian Furnace [phone] MLM    Klebsiella aerogenes NOT DETECTED NOT DETECTED Final   Klebsiella oxytoca NOT DETECTED NOT DETECTED Final   Klebsiella pneumoniae NOT DETECTED NOT DETECTED Final   Proteus species NOT DETECTED NOT DETECTED Final   Salmonella species NOT DETECTED NOT DETECTED Final   Serratia marcescens NOT DETECTED NOT DETECTED Final   Haemophilus influenzae NOT DETECTED NOT DETECTED Final   Neisseria meningitidis NOT DETECTED NOT DETECTED Final   Pseudomonas aeruginosa NOT DETECTED NOT DETECTED Final   Stenotrophomonas maltophilia NOT DETECTED NOT DETECTED Final   Candida albicans NOT DETECTED NOT DETECTED Final   Candida auris NOT DETECTED NOT DETECTED Final   Candida glabrata NOT DETECTED NOT DETECTED Final   Candida krusei NOT DETECTED NOT DETECTED Final   Candida parapsilosis NOT DETECTED NOT DETECTED Final   Candida tropicalis NOT DETECTED NOT DETECTED Final   Cryptococcus neoformans/gattii NOT DETECTED NOT DETECTED Final   CTX-M ESBL NOT DETECTED NOT DETECTED Final   Carbapenem resistance IMP NOT DETECTED NOT DETECTED Final   Carbapenem resistance KPC NOT DETECTED NOT DETECTED Final   Carbapenem resistance NDM NOT DETECTED NOT DETECTED Final   Carbapenem resist OXA 48 LIKE NOT DETECTED NOT DETECTED Final   Carbapenem resistance VIM NOT DETECTED NOT DETECTED Final    Comment: Performed at [facility], [address]  [place], [place] [address]  Blood culture (routine x 2)     Status: None (Preliminary  result)   Collection Time: 08/14/21  9:20 PM   Specimen: BLOOD  Result Value Ref Range Status   Specimen Description BLOOD SITE NOT SPECIFIED  Final   Special Requests   Final    BOTTLES DRAWN AEROBIC AND ANAEROBIC Blood Culture adequate volume   Culture   Final    NO GROWTH 2 DAYS Performed at [facility], [address] [place], [place] [id number]    Report Status PENDING  Incomplete  Blood culture (routine x 2)     Status: None (Preliminary result)   Collection Time: 08/14/21  9:23 PM   Specimen: BLOOD  Result Value Ref Range Status   Specimen Description BLOOD SITE NOT SPECIFIED  Final   Special Requests   Final    BOTTLES DRAWN AEROBIC AND ANAEROBIC Blood Culture adequate volume   Culture   Final    NO GROWTH 2 DAYS Performed at [facility], [address] [place], [place] [address]    Report Status PENDING  Incomplete    Procedures/Studies: MR Lumbar Spine W Wo Contrast  Result Date: 08/13/2021 CLINICAL DATA:  Low back pain EXAM: MRI LUMBAR SPINE WITHOUT AND WITH CONTRAST TECHNIQUE: Multiplanar and multiecho pulse sequences of the lumbar spine were obtained without and with intravenous contrast. CONTRAST:  6mL GADAVIST GADOBUTROL 1 MMOL/ML IV SOLN COMPARISON:  None Available. FINDINGS: Segmentation:  Standard. Alignment:  Physiologic. Vertebrae:  No fracture, evidence of discitis, or bone lesion. Conus medullaris and cauda equina: Conus extends to the L1-2 level. Conus and cauda equina appear normal. Paraspinal and other soft tissues: Negative. Disc levels: L1-L2: Normal disc space and facet joints. No spinal canal stenosis. No neural foraminal stenosis. L2-L3: Normal disc space and facet joints. No spinal canal stenosis. No neural foraminal stenosis. L3-L4: Left asymmetric disc bulge. Left lateral recess narrowing without central spinal canal stenosis. No neural foraminal stenosis.  L4-L5: Disc bulge with superimposed small central protrusion. No spinal canal stenosis. No neural foraminal stenosis. L5-S1: Small disc bulge. No spinal canal stenosis. No neural foraminal stenosis. Visualized sacrum: Normal. IMPRESSION: 1. Mild lower lumbar degenerative disc disease without spinal canal or neural foraminal stenosis. 2. Left lateral recess narrowing at L3-L4 may serve as a source of L4 radiculopathy. Electronically Signed   By: Deatra RobinsonKevin  Herman M.D.   On: 08/13/2021 23:29   CT L-SPINE NO CHARGE  Result Date: 08/13/2021 CLINICAL DATA:  Back pain EXAM: CT LUMBAR SPINE WITHOUT CONTRAST TECHNIQUE: Multidetector CT imaging of the lumbar spine was performed without intravenous contrast administration. Multiplanar CT image reconstructions were also generated. RADIATION DOSE REDUCTION: This exam was performed according to the departmental dose-optimization program which includes automated exposure control, adjustment of the mA and/or kV according to patient size and/or use of iterative reconstruction technique. COMPARISON:  None Available. FINDINGS: Segmentation: 5 lumbar type vertebrae. Alignment: Normal. Vertebrae: No acute fracture or focal pathologic process. Paraspinal and other soft tissues: Negative. Disc levels: No spinal canal stenosis. IMPRESSION: No acute fracture or static subluxation of the lumbar spine. Electronically Signed   By: Deatra RobinsonKevin  Herman M.D.   On: 08/13/2021 20:25   CT Renal Stone Study  Result Date: 08/13/2021 CLINICAL DATA:  Low back pain and fever. Flank pain, stone disease suspected. EXAM: CT ABDOMEN AND PELVIS WITHOUT CONTRAST TECHNIQUE: Multidetector CT imaging of the abdomen and pelvis was performed following the standard protocol without IV contrast. RADIATION DOSE REDUCTION: This exam was performed according to the departmental dose-optimization program which includes automated exposure control,  adjustment of the mA and/or kV according to patient size and/or use of  iterative reconstruction technique. COMPARISON:  CT 12/20/2017 FINDINGS: Lower chest: No acute findings allowing for mild motion artifact. No pleural fluid. Hepatobiliary: Borderline hepatic steatosis. No evidence of focal liver abnormality on this unenhanced exam. Multiple small calcified gallstones. Gallbladder physiologically distended. No pericholecystic fat stranding. No biliary dilatation. Pancreas: Fatty atrophy.  No ductal dilatation or inflammation. Spleen: Normal in size without focal abnormality. Adrenals/Urinary Tract: No adrenal nodule. No renal calculi. Perinephric edema about the mid lower right kidney. No hydronephrosis. Both ureters are decompressed. Occasional areas of scarring in the left kidney. No focal renal lesions are seen on this unenhanced exam. Unremarkable appearance of the urinary bladder, no wall thickening or perivesicular edema. Stomach/Bowel: Decompressed stomach. No small bowel obstruction or inflammation. Normal appendix. Moderate volume of colonic stool without inflammation. Vascular/Lymphatic: Normal caliber abdominal aorta shattered small retroperitoneal lymph nodes are not enlarged by size criteria. Reproductive: Uterus and bilateral adnexa are unremarkable. Other: Small fat containing umbilical hernia. No free air, free fluid, or intra-abdominal fluid collection. Musculoskeletal: There are no acute or suspicious osseous abnormalities. IMPRESSION: 1. Perinephric edema about the mid lower right kidney, suspicious for pyelonephritis. Recommend correlation with urinalysis. 2. No renal stones or obstructive uropathy. 3. Cholelithiasis without CT findings of acute cholecystitis. Electronically Signed   By: Narda Rutherford M.D.   On: 08/13/2021 20:18   CT Head Wo Contrast  Result Date: 08/12/2021 CLINICAL DATA:  Headache EXAM: CT HEAD WITHOUT CONTRAST TECHNIQUE: Contiguous axial images were obtained from the base of the skull through the vertex without intravenous contrast.  RADIATION DOSE REDUCTION: This exam was performed according to the departmental dose-optimization program which includes automated exposure control, adjustment of the mA and/or kV according to patient size and/or use of iterative reconstruction technique. COMPARISON:  CT head 05/14/2014 FINDINGS: Brain: No acute intracranial hemorrhage, mass effect, or herniation. No extra-axial fluid collections. No evidence of acute territorial infarct. No hydrocephalus. Vascular: No hyperdense vessel or unexpected calcification. Skull: Normal. Negative for fracture or focal lesion. Sinuses/Orbits: Old fracture deformity of the right lamina papyracea. No acute process identified. Other: Minimal opacification in the inferior left mastoid air cells. IMPRESSION: No acute intracranial process identified. Electronically Signed   By: Jannifer Hick M.D.   On: 08/12/2021 18:20     Time coordinating discharge: Over 30 minutes    Lewie Chamber, MD  [facility] 08/16/2021, 5:49 PM

## 2021-08-16 NOTE — Plan of Care (Signed)

## 2021-08-17 ENCOUNTER — Telehealth (HOSPITAL_BASED_OUTPATIENT_CLINIC_OR_DEPARTMENT_OTHER): Payer: Self-pay | Admitting: *Deleted

## 2021-08-17 NOTE — Telephone Encounter (Signed)
Post ED Visit - Positive Culture Follow-up  Culture report reviewed by antimicrobial stewardship pharmacist: [facility] []  , Pharm.D. []  Enzo Bi, Pharm.D., BCPS AQ-ID []  , Pharm.D., BCPS []  Celedonio Miyamoto, Pharm.D., BCPS []  [place], Garvin Fila.D., BCPS, AAHIVP []  , Pharm.D., BCPS, AAHIVP []  Georgina Pillion, PharmD, BCPS []  , PharmD, BCPS []  [place], PharmD, BCPS []  [place], PharmD []  , PharmD, BCPS [x]  Estella Husk, PharmD  Pharmacy Team []  Lysle Pearl, PharmD []  , PharmD []  Phillips Climes, PharmD []  , Rph []  Agapito Games) , PharmD []  Verlan Friends, PharmD []  , PharmD []  Mervyn Gay, PharmD []  , PharmD []  Domingo Sep, PharmD []  Wonda Olds, PharmD []  , PharmD []  Len Childs, PharmD   Positive Cephalexin culture Pt was admitted for bacteremia, treated, and discharged 7/8 and no further patient follow-up is required at this time.  Margaret Pace 08/17/2021, 11:01 AM

## 2021-08-18 ENCOUNTER — Telehealth: Payer: Self-pay

## 2021-08-18 LAB — CULTURE, BLOOD (ROUTINE X 2): Culture: NO GROWTH

## 2021-08-18 NOTE — Telephone Encounter (Signed)
Transition Care Management Unsuccessful Follow-up Telephone Call  Call placed with assistance of [employer] [id number]/[employer]   Date of discharge and from where:  08/16/2021, [facility]  Attempts:  1st Attempt  Reason for unsuccessful TCM follow-up call:  Left voice message [phone], call back requested to this CM.   Patient has a follow up appointment scheduled at [facility] with Dr Alvis Lemmings - 09/02/2021.

## 2021-08-19 ENCOUNTER — Telehealth: Payer: Self-pay

## 2021-08-19 LAB — CULTURE, BLOOD (ROUTINE X 2)
Culture: NO GROWTH
Culture: NO GROWTH
Special Requests: ADEQUATE
Special Requests: ADEQUATE

## 2021-08-19 NOTE — Telephone Encounter (Signed)
Transition Care Management Unsuccessful Follow-up Telephone Call Call placed with assistance of Spanish Interpreter [id number]/[employer]   Date of discharge and from where:  08/16/2021, [facility]  Attempts:  2nd Attempt  Reason for unsuccessful TCM follow-up call:  Left voice message  [phone], call back requested to this CM.    Patient has a follow up appointment scheduled at [facility] with Dr Alvis Lemmings - 09/02/2021.

## 2021-08-20 ENCOUNTER — Telehealth: Payer: Self-pay

## 2021-08-20 NOTE — Telephone Encounter (Signed)
Transition Care Management Follow-up Telephone Call  Call completed with assistance of Spanish Interpreter [id number]/[employer] Date of discharge and from where: 08/16/2021, [facility] How have you been since you were released from the hospital? She said she is feeling good.  Any questions or concerns? No  Items Reviewed: Did the pt receive and understand the discharge instructions provided? Yes  Medications obtained and verified? Yes - she said she has all of her medications and a glucometer and did not have any questions about the med regime.  Other? No  Any new allergies since your discharge? No  Dietary orders reviewed? No Do you have support at home? Yes   Home Care and Equipment/Supplies: Were home health services ordered? no If so, what is the name of the agency? N/a  Has the agency set up a time to come to the patient's home? not applicable Were any new equipment or medical supplies ordered?  No What is the name of the medical supply agency? N/a Were you able to get the supplies/equipment? not applicable Do you have any questions related to the use of the equipment or supplies? No  Functional Questionnaire: (I = Independent and D = Dependent) ADLs: independent   Follow up appointments reviewed:  PCP Hospital f/u appt confirmed? Yes  Scheduled to see Dr Alvis Lemmings - 09/02/2021.  Specialist Hospital f/u appt confirmed?  None scheduled at this time    Are transportation arrangements needed? No  If their condition worsens, is the pt aware to call PCP or go to the Emergency Dept.? Yes Was the patient provided with contact information for the PCP's office or ED? Yes Was to pt encouraged to call back with questions or concerns? Yes

## 2021-09-02 ENCOUNTER — Encounter: Payer: Self-pay | Admitting: Family Medicine

## 2021-09-02 ENCOUNTER — Ambulatory Visit: Payer: Self-pay | Attending: Family Medicine | Admitting: Family Medicine

## 2021-09-02 ENCOUNTER — Other Ambulatory Visit: Payer: Self-pay

## 2021-09-02 VITALS — BP 102/65 | HR 59 | Temp 98.2°F | Ht 62.0 in | Wt 141.4 lb

## 2021-09-02 DIAGNOSIS — Z1211 Encounter for screening for malignant neoplasm of colon: Secondary | ICD-10-CM

## 2021-09-02 DIAGNOSIS — I10 Essential (primary) hypertension: Secondary | ICD-10-CM

## 2021-09-02 DIAGNOSIS — E1169 Type 2 diabetes mellitus with other specified complication: Secondary | ICD-10-CM

## 2021-09-02 MED ORDER — ATORVASTATIN CALCIUM 40 MG PO TABS
ORAL_TABLET | Freq: Every day | ORAL | 6 refills | Status: DC
  Filled 2021-09-02: qty 30, fill #0

## 2021-09-02 MED ORDER — METFORMIN HCL 1000 MG PO TABS
ORAL_TABLET | Freq: Two times a day (BID) | ORAL | 6 refills | Status: DC
  Filled 2021-09-02: qty 60, fill #0
  Filled 2021-10-24: qty 60, 30d supply, fill #0
  Filled 2021-12-14 – 2021-12-15 (×3): qty 60, 30d supply, fill #1
  Filled 2022-01-19: qty 60, 30d supply, fill #2
  Filled 2022-03-01 – 2022-03-02 (×2): qty 60, 30d supply, fill #3
  Filled 2022-04-13 (×2): qty 60, 30d supply, fill #4
  Filled 2022-06-14 – 2022-06-15 (×2): qty 60, 30d supply, fill #5
  Filled 2022-07-27: qty 60, 30d supply, fill #6

## 2021-09-02 MED ORDER — LISINOPRIL 2.5 MG PO TABS
ORAL_TABLET | Freq: Every day | ORAL | 6 refills | Status: DC
  Filled 2021-09-02: qty 30, 30d supply, fill #0
  Filled 2021-10-23: qty 30, 30d supply, fill #1
  Filled 2021-12-14 – 2021-12-15 (×2): qty 30, 30d supply, fill #2
  Filled 2022-01-19: qty 30, 30d supply, fill #3
  Filled 2022-03-01 – 2022-03-02 (×2): qty 30, 30d supply, fill #4
  Filled 2022-04-13 (×2): qty 30, 30d supply, fill #5
  Filled 2022-06-14 – 2022-06-15 (×2): qty 30, 30d supply, fill #6

## 2021-09-02 NOTE — Progress Notes (Signed)
Discuss stopping Invokana due to making her feel sick.

## 2021-09-02 NOTE — Progress Notes (Signed)
Subjective:  Patient ID: Margaret Pace, female    DOB: [birth year]  Age: 56 y.o. MRN: [id number]  CC: Hospitalization Follow-up   HPI Jarrod Bodkins is a 56 y.o. year old female with a history of type 2 diabetes mellitus (A1c 7.0), hypertension. She was hospitalized 08/14/2021 through 08/16/2021 for E. coli bacteremia in the setting of pyelonephritis.  Prior to hospitalization she had been treated outpatient for UTI with Keflex after she had presented to the ED with low back pain and was subsequently called into the hospital due to blood cultures being positive for E. coli.  CT revealed perinephric edema around right kidney concerning for pyelonephritis.  She was subsequently discharged on cefadroxil.  Interval History:  Invokana was discontinued during hospitalizatin and she has not been on it for the last 2 weeks.  States her blood sugars have been around 160.  She remains adherent with glipizide, metformin and Trulicity. She is concened that she only eats once a day due to loss of appetite.  Denies presence of urinary symptoms or back pain and has no fever. She is adherent with her antihypertensive and statin. Denies additional concerns today. Past Medical History:  Diagnosis Date   Anemia    Pt reported blood transfusion after left leg fracture.   Carpal tunnel syndrome, bilateral    Diabetes mellitus without complication [facility])     Past Surgical History:  Procedure Laterality Date   APPLICATION OF WOUND VAC Left 05/14/2014   Procedure: APPLICATION OF WOUND VAC;  Surgeon: Myrene Galas, MD;  Location: [facility] OR;  Service: Orthopedics;  Laterality: Left;   FASCIOTOMY Left 05/14/2014   Procedure: FOUR COMPARTMENT FASCIOTOMY;  Surgeon: Myrene Galas, MD;  Location: [facility] OR;  Service: Orthopedics;  Laterality: Left;   FRACTURE SURGERY     HARDWARE REMOVAL Left 10/19/2014   Procedure: HARDWARE REMOVAL LEFT TIBIA AND ANKLE;  Surgeon: Myrene Galas, MD;  Location: [facility] OR;  Service:  Orthopedics;  Laterality: Left;   SECONDARY CLOSURE OF WOUND Left 05/17/2014   Procedure: WOUND CLOSURE LEFT LEG ;  Surgeon: Myrene Galas, MD;  [facility] OR;  Service: Orthopedics;  Laterality: Left;   TIBIA IM NAIL INSERTION Left 05/14/2014   Procedure: INTRAMEDULLARY (IM) NAIL TIBIAL;  Surgeon: Myrene Galas, MD;  Location: [facility] OR;  Service: Orthopedics;  Laterality: Left;    Family History  Problem Relation Age of Onset   Diabetes Mother    Diabetes Sister    Diabetes Brother    Diabetes Sister    Diabetes Sister    Diabetes Brother     Social History   Socioeconomic History   Marital status: Married    Spouse name: Not on file   Number of children: Not on file   Years of education: Not on file   Highest education level: Not on file  Occupational History   Not on file  Tobacco Use   Smoking status: Never   Smokeless tobacco: Never  Vaping Use   Vaping Use: Never used  Substance and Sexual Activity   Alcohol use: No    Alcohol/week: 0.0 standard drinks of alcohol   Drug use: No   Sexual activity: Not Currently  Other Topics Concern   Not on file  Social History Narrative   Not on file   Social Determinants of Health   Financial Resource Strain: Not on file  Food Insecurity: Not on file  Transportation Needs: Not on file  Physical Activity: Inactive (01/28/2017)   Exercise Vital Sign  Days of Exercise per Week: 0 days    Minutes of Exercise per Session: 0 min  Stress: No Stress Concern Present (01/28/2017)   [employer] of Occupational Health - Occupational Stress Questionnaire    Feeling of Stress : Not at all  Social Connections: Moderately Integrated (01/28/2017)   Social Connection and Isolation Panel [NHANES]    Frequency of Communication with Friends and Family: More than three times a week    Frequency of Social Gatherings with Friends and Family: More than three times a week    Attends Religious Services: More than 4 times per year    Active  Member of [employer] or Organizations: No    Attends Banker Meetings: Never    Marital Status: Married    No Known Allergies  Outpatient Medications Prior to Visit  Medication Sig Dispense Refill   Blood Glucose Monitoring Suppl (TRUE METRIX METER) DEVI 1 each by Does not apply route 3 (three) times daily before meals. 1 Device 0   Dulaglutide (TRULICITY) 0.75 MG/0.5ML SOPN Inject 0.75 mg into the skin once a week. 2 mL 6   glipiZIDE (GLUCOTROL) 10 MG tablet TAKE 1 TABLET (10 MG TOTAL) BY MOUTH 2 (TWO) TIMES DAILY BEFORE A MEAL. 60 tablet 3   glucose blood test strip USE AS DIRECTED THREE TIMES DAILY 100 each 12   Insulin Pen Needle 31G X 5 MM MISC 1 each by Does not apply route at bedtime. 100 each 1   loratadine (CLARITIN) 10 MG tablet Take 1 tablet (10 mg total) by mouth daily. 30 tablet 0   naproxen (NAPROSYN) 500 MG tablet Take 1 tablet (500 mg total) by mouth 2 (two) times daily with a meal. 14 tablet 0   ondansetron (ZOFRAN) 4 MG tablet Take 1 tablet (4 mg total) by mouth every 6 (six) hours. 15 tablet 0   TRUEPLUS LANCETS 28G MISC 1 each by Does not apply route 3 (three) times daily before meals. 100 each 12   atorvastatin (LIPITOR) 40 MG tablet TAKE 1 TABLET (40 MG TOTAL) BY MOUTH DAILY. 30 tablet 3   canagliflozin (INVOKANA) 100 MG TABS tablet Take 100 mg by mouth daily before breakfast.     lisinopril (ZESTRIL) 2.5 MG tablet TAKE 1 TABLET (2.5 MG TOTAL) BY MOUTH DAILY. 30 tablet 6   metFORMIN (GLUCOPHAGE) 1000 MG tablet TAKE 1 TABLET (1,000 MG TOTAL) BY MOUTH 2 (TWO) TIMES DAILY WITH A MEAL. 60 tablet 3   No facility-administered medications prior to visit.     ROS Review of Systems  Constitutional:  Negative for activity change and appetite change.  HENT:  Negative for sinus pressure and sore throat.   Respiratory:  Negative for chest tightness, shortness of breath and wheezing.   Cardiovascular:  Negative for chest pain and palpitations.  Gastrointestinal:   Negative for abdominal distention, abdominal pain and constipation.  Genitourinary: Negative.   Musculoskeletal: Negative.   Psychiatric/Behavioral:  Negative for behavioral problems and dysphoric mood.     Objective:  BP 102/65   Pulse (!) 59   Temp 98.2 F (36.8 C) (Oral)   Ht 5\' 2"  (1.575 m)   Wt 141 lb 6.4 oz (64.1 kg)   LMP 11/12/2013   SpO2 100%   BMI 25.86 kg/m      09/02/2021   11:41 AM 08/16/2021    7:49 AM 08/16/2021    5:02 AM  BP/Weight  Systolic BP 102 125 129  Diastolic BP 65 88 49  Wt. (  Lbs) 141.4    BMI 25.86 kg/m2        Physical Exam Constitutional:      Appearance: She is well-developed.  Cardiovascular:     Rate and Rhythm: Normal rate.     Heart sounds: Normal heart sounds. No murmur heard. Pulmonary:     Effort: Pulmonary effort is normal.     Breath sounds: Normal breath sounds. No wheezing or rales.  Chest:     Chest wall: No tenderness.  Abdominal:     General: Bowel sounds are normal. There is no distension.     Palpations: Abdomen is soft. There is no mass.     Tenderness: There is no abdominal tenderness.  Musculoskeletal:        General: Normal range of motion.     Right lower leg: No edema.     Left lower leg: No edema.  Neurological:     Mental Status: She is alert and oriented to person, place, and time.  Psychiatric:        Mood and Affect: Mood normal.        Latest Ref Rng & Units 08/16/2021    1:06 AM 08/15/2021    3:38 AM 08/14/2021    3:58 PM  CMP  Glucose 70 - 99 mg/dL 99  86  354   BUN 6 - 20 mg/dL 5  10  14    Creatinine 0.44 - 1.00 mg/dL  6.56  8.12   Sodium 135 - 145 mmol/L 142  141  136   Potassium 3.5 - 5.1 mmol/L 3.7  3.5  3.6   Chloride 98 - 111 mmol/L 111  111  105   CO2 22 - 32 mmol/L 22  20  19    Calcium 8.9 - 10.3 mg/dL 8.9  8.2  8.6   Total Protein 6.5 - 8.1 g/dL   6.3   Total Bilirubin 0.3 - 1.2 mg/dL   0.7   Alkaline Phos 38 - 126 U/L   179   AST 15 - 41 U/L   54   ALT 0 - 44 U/L   29      Lipid Panel     Component Value Date/Time   CHOL 103 04/22/2020 1205   TRIG 186 (H) 04/22/2020 1205   HDL 36 (L) 04/22/2020 1205   CHOLHDL 2.9 04/22/2020 1205   CHOLHDL 3.3 04/14/2016 0928   VLDL 45 (H) 09/25/2015 1144   LDLCALC 37 04/22/2020 1205    CBC    Component Value Date/Time   WBC 3.4 (L) 08/16/2021 0106   RBC 3.31 (L) 08/16/2021 0106   HGB 9.6 (L) 08/16/2021 0106   HCT 29.5 (L) 08/16/2021 0106   PLT 165 08/16/2021 0106   MCV 89.1 08/16/2021 0106   MCH 29.0 08/16/2021 0106   MCHC 32.5 08/16/2021 0106   RDW 14.2 08/16/2021 0106   LYMPHSABS 0.9 08/16/2021 0106   MONOABS 0.3 08/16/2021 0106   EOSABS 0.0 08/16/2021 0106   BASOSABS 0.0 08/16/2021 0106    Lab Results  Component Value Date   HGBA1C 7.0 (A) 07/22/2021    Assessment & Plan:  1. Essential hypertension Controlled Counseled on blood pressure goal of less than 130/80, low-sodium, DASH diet, medication compliance, 150 minutes of moderate intensity exercise per week. Discussed medication compliance, adverse effects. - lisinopril (ZESTRIL) 2.5 MG tablet; TAKE 1 TABLET (2.5 MG TOTAL) BY MOUTH DAILY.  Dispense: 30 tablet; Refill: 6  2. Type 2 diabetes mellitus with other specified complication, without  long-term current use of insulin (HCC) Controlled with A1c of 7.0 Invokana discontinued due to hospitalization for pyelonephritis Due to complaints about reduced appetite with Trulicity I will hold off on increasing her dose but will reassess at next visit and determine if we need to increase her dose She has made it clear that she would not like to be on insulin. Counseled on Diabetic diet, my plate method, 161 minutes of moderate intensity exercise/week Blood sugar logs with fasting goals of 80-120 mg/dl, random of less than 096 and in the event of sugars less than 60 mg/dl or greater than 045 mg/dl encouraged to notify the clinic. Advised on the need for annual eye exams, annual foot exams, Pneumonia  vaccine. - LP+Non-HDL Cholesterol - Microalbumin/Creatinine Ratio, Urine - atorvastatin (LIPITOR) 40 MG tablet; TAKE 1 TABLET (40 MG TOTAL) BY MOUTH DAILY.  Dispense: 30 tablet; Refill: 6 - metFORMIN (GLUCOPHAGE) 1000 MG tablet; TAKE 1 TABLET (1,000 MG TOTAL) BY MOUTH 2 (TWO) TIMES DAILY WITH A MEAL.  Dispense: 60 tablet; Refill: 6  3. Screening for colon cancer - Fecal occult blood, imunochemical([facility])     Meds ordered this encounter  Medications   atorvastatin (LIPITOR) 40 MG tablet    Sig: TAKE 1 TABLET (40 MG TOTAL) BY MOUTH DAILY.    Dispense:  30 tablet    Refill:  6   lisinopril (ZESTRIL) 2.5 MG tablet    Sig: TAKE 1 TABLET (2.5 MG TOTAL) BY MOUTH DAILY.    Dispense:  30 tablet    Refill:  6   metFORMIN (GLUCOPHAGE) 1000 MG tablet    Sig: TAKE 1 TABLET (1,000 MG TOTAL) BY MOUTH 2 (TWO) TIMES DAILY WITH A MEAL.    Dispense:  60 tablet    Refill:  6    Discontinue Invokana    Follow-up: Return in about 3 months (around 12/03/2021) for Chronic medical conditions.       Hoy Register, MD, FAAFP. [facility] [place], [place] [phone]   09/02/2021, 1:19 PM

## 2021-09-02 NOTE — Patient Instructions (Signed)
Zoster Vaccine, Recombinant injection Qu es este medicamento? [employer] CONTRA EL ZSTER es una vacuna que se [place] para reducir el riesgo de contraer herpes zster (culebrilla). Esta vacuna no se [place] para tratar el herpes zster o el dolor neurolgico causado por herpes zster. Este medicamento puede ser utilizado para otros usos; si tiene alguna pregunta consulte con su proveedor de atencin mdica o con su farmacutico. MARCAS COMUNES: SHINGRIX Qu le debo informar a mi profesional de la salud antes de tomar este medicamento? Necesitan saber si usted presenta alguno de los siguientes problemas o situaciones: cncer problemas del sistema inmunolgico una reaccin alrgica o inusual a la vacuna contra el Zster, a otros medicamentos, alimentos, colorantes o conservantes si est embarazada o buscando quedar embarazada si est amamantando a un beb Cmo debo [employer]? Esta vacuna se inyecta en un msculo. La administra un proveedor de Psychologist, prison and probation services. Recibir una copia de informacin escrita sobre la vacuna antes de cada vacuna. Asegrese de leer esta informacin cada vez cuidadosamente. Esta hoja puede cambiar frecuentemente. Hable con su proveedor de atencin [employer] uso de esta vacuna en nios. Esta vacuna no est aprobado para uso en nios. Sobredosis: Pngase en contacto inmediatamente con un centro toxicolgico o una sala de urgencia si usted cree que haya tomado demasiado medicamento. ATENCIN: [employer] es solo para usted. No comparta este medicamento con nadie. Qu sucede si me olvido de una dosis? Cumpla con las citas para dosis de seguimiento (refuerzo). Es importante no olvidar ninguna dosis. Llame a su proveedor de atencin mdica si no puede asistir a una cita. Qu puede interactuar con este medicamento? medicamentos que suprimen el sistema inmunolgico medicamentos para tratar Buyer, retail esteroideos, tales como la prednisona o la  cortisona Puede ser que esta lista no menciona todas las posibles interacciones. Informe a su profesional de [employer] de [employer] productos a base de hierbas, medicamentos de [place] o suplementos nutritivos que est tomando. Si usted fuma, consume bebidas alcohlicas o si utiliza drogas ilegales, indqueselo tambin a su profesional de [employer]. Algunas sustancias pueden interactuar con su medicamento. A qu debo estar atento al usar [employer]? Visite peridicamente a su proveedor de Psychologist, prison and probation services. Es posible que esta [place], como todas las vacunas, no protejan completamente a todos. Qu efectos secundarios puedo tener al [employer] este medicamento? Efectos secundarios que debe informar a su mdico o a Producer, television/film/video de la salud tan pronto como sea posible: Therapist, art (erupcin cutnea, comezn/picazn o urticaria; hinchazn de la cara, los labios o la lengua) problemas para respirar Efectos secundarios que generalmente no requieren atencin mdica (debe informarlos a su mdico o a Producer, television/film/video de la salud si persisten o si son molestos): escalofros dolor de cabeza fiebre nuseas dolor, enrojecimiento o Marketing executive de la inyeccin cansancio vmito Puede ser que esta lista no menciona todos los posibles efectos secundarios. Comunquese a su mdico por asesoramiento mdico [employer]. Usted puede informar los efectos secundarios a la FDA por telfono al 1-800-FDA-1088. Dnde debo guardar mi medicina? Esta vacuna solamente es administrada por un proveedor de Psychologist, prison and probation services. No se guardar en su casa. ATENCIN: Este folleto es un resumen. Puede ser que no cubra toda la posible informacin. Si usted tiene preguntas acerca de esta medicina, consulte con su mdico, su farmacutico o su profesional de Radiographer, therapeutic.  2023 Elsevier/Gold Standard (2019-08-01 00:00:00)

## 2021-09-03 LAB — LP+NON-HDL CHOLESTEROL
Cholesterol, Total: 213 mg/dL — ABNORMAL HIGH (ref 100–199)
HDL: 38 mg/dL — ABNORMAL LOW (ref >39)
LDL Chol Calc (NIH): 162 mg/dL — ABNORMAL HIGH (ref 0–99)
Total Non-HDL-Chol (LDL+VLDL): 175 mg/dL — ABNORMAL HIGH (ref 0–129)
Triglycerides: 73 mg/dL (ref 0–149)
VLDL Cholesterol Cal: 13 mg/dL (ref 5–40)

## 2021-09-03 LAB — MICROALBUMIN / CREATININE URINE RATIO
Creatinine, Urine: 108.3 mg/dL
Microalb/Creat Ratio: 7 mg/g creat (ref 0–29)
Microalbumin, Urine: 7.3 ug/mL

## 2021-09-04 ENCOUNTER — Other Ambulatory Visit: Payer: Self-pay

## 2021-09-04 ENCOUNTER — Other Ambulatory Visit: Payer: Self-pay | Admitting: Family Medicine

## 2021-09-04 DIAGNOSIS — E1169 Type 2 diabetes mellitus with other specified complication: Secondary | ICD-10-CM

## 2021-09-04 MED ORDER — ATORVASTATIN CALCIUM 80 MG PO TABS
80.0000 mg | ORAL_TABLET | Freq: Every day | ORAL | 6 refills | Status: DC
  Filled 2021-09-04: qty 30, 30d supply, fill #0
  Filled 2021-10-23: qty 30, 30d supply, fill #1
  Filled 2021-12-14: qty 30, 30d supply, fill #2

## 2021-10-24 ENCOUNTER — Other Ambulatory Visit: Payer: Self-pay

## 2021-12-14 ENCOUNTER — Other Ambulatory Visit: Payer: Self-pay | Admitting: Family Medicine

## 2021-12-15 ENCOUNTER — Encounter: Payer: Self-pay | Admitting: Family Medicine

## 2021-12-15 ENCOUNTER — Ambulatory Visit: Payer: Self-pay | Attending: Family Medicine | Admitting: Family Medicine

## 2021-12-15 ENCOUNTER — Other Ambulatory Visit: Payer: Self-pay

## 2021-12-15 VITALS — BP 129/73 | HR 64 | Temp 98.2°F | Ht 62.0 in | Wt 145.0 lb

## 2021-12-15 DIAGNOSIS — E782 Mixed hyperlipidemia: Secondary | ICD-10-CM

## 2021-12-15 DIAGNOSIS — E785 Hyperlipidemia, unspecified: Secondary | ICD-10-CM | POA: Insufficient documentation

## 2021-12-15 DIAGNOSIS — Z23 Encounter for immunization: Secondary | ICD-10-CM

## 2021-12-15 DIAGNOSIS — Z1211 Encounter for screening for malignant neoplasm of colon: Secondary | ICD-10-CM

## 2021-12-15 DIAGNOSIS — E1169 Type 2 diabetes mellitus with other specified complication: Secondary | ICD-10-CM

## 2021-12-15 LAB — POCT GLYCOSYLATED HEMOGLOBIN (HGB A1C): HbA1c, POC (controlled diabetic range): 9 % — AB (ref 0.0–7.0)

## 2021-12-15 LAB — GLUCOSE, POCT (MANUAL RESULT ENTRY): POC Glucose: 182 mg/dl — AB (ref 70–99)

## 2021-12-15 MED ORDER — ATORVASTATIN CALCIUM 80 MG PO TABS
80.0000 mg | ORAL_TABLET | Freq: Every day | ORAL | 6 refills | Status: DC
  Filled 2021-12-15: qty 30, 30d supply, fill #0
  Filled 2022-01-19: qty 30, 30d supply, fill #1
  Filled 2022-03-01 – 2022-03-02 (×2): qty 30, 30d supply, fill #2
  Filled 2022-04-13 (×2): qty 30, 30d supply, fill #3
  Filled 2022-06-14 – 2022-06-15 (×2): qty 30, 30d supply, fill #4
  Filled 2022-07-27: qty 30, 30d supply, fill #5

## 2021-12-15 MED ORDER — GLIPIZIDE 10 MG PO TABS
ORAL_TABLET | Freq: Two times a day (BID) | ORAL | 3 refills | Status: DC
  Filled 2021-12-15: qty 18, 9d supply, fill #0
  Filled 2021-12-15: qty 42, 21d supply, fill #0
  Filled 2022-01-19: qty 60, 30d supply, fill #1
  Filled 2022-03-01 – 2022-03-02 (×2): qty 60, 30d supply, fill #2
  Filled 2022-04-13 (×2): qty 60, 30d supply, fill #3

## 2021-12-15 MED ORDER — MISC. DEVICES MISC: 0 refills | Status: DC

## 2021-12-15 MED ORDER — TRULICITY 1.5 MG/0.5ML ~~LOC~~ SOAJ
1.5000 mg | SUBCUTANEOUS | 6 refills | Status: DC
  Filled 2021-12-15: qty 2, 28d supply, fill #0
  Filled 2022-01-19: qty 2, 28d supply, fill #1
  Filled 2022-03-01 – 2022-03-02 (×2): qty 2, 28d supply, fill #2

## 2021-12-15 NOTE — Progress Notes (Signed)
Subjective:  Patient ID: Margaret Pace, female    DOB: [birth year]  Age: 56 y.o. MRN: [id number]  CC: Diabetes   HPI Margaret Pace is a 56 y.o. year old female with a history of type 2 diabetes mellitus (A1c 9.0), hypertension, hyperlipidemia.  Interval History: A1c is 9.0 up from 7.0 She attributes elevation in A1c to not exercising but endorses adherence with all her medications Adhering to a diabetic diet.  She has no neuropathy symptoms or visual concerns but is not up-to-date on annual eye exam.   Her bottle reveals she is on 40mg  of atorvastatin rather than 80 mg which was prescribed for her hyperlipidemia after her last lipid panel was elevated. Doing well on lisinopril. She denies additional concerns today. Past Medical History:  Diagnosis Date   Anemia    Pt reported blood transfusion after left leg fracture.   Carpal tunnel syndrome, bilateral    Diabetes mellitus without complication [facility])     Past Surgical History:  Procedure Laterality Date   APPLICATION OF WOUND VAC Left 05/14/2014   Procedure: APPLICATION OF WOUND VAC;  Surgeon: 07/14/2014, MD;  [facility] OR;  Service: Orthopedics;  Laterality: Left;   FASCIOTOMY Left 05/14/2014   Procedure: FOUR COMPARTMENT FASCIOTOMY;  Surgeon: 07/14/2014, MD;  [facility] OR;  Service: Orthopedics;  Laterality: Left;   FRACTURE SURGERY     HARDWARE REMOVAL Left 10/19/2014   Procedure: HARDWARE REMOVAL LEFT TIBIA AND ANKLE;  Surgeon: 12/19/2014, MD;  Location: [facility] OR;  Service: Orthopedics;  Laterality: Left;   SECONDARY CLOSURE OF WOUND Left 05/17/2014   Procedure: WOUND CLOSURE LEFT LEG ;  Surgeon: 07/17/2014, MD;  Location: [facility] OR;  Service: Orthopedics;  Laterality: Left;   TIBIA IM NAIL INSERTION Left 05/14/2014   Procedure: INTRAMEDULLARY (IM) NAIL TIBIAL;  Surgeon: 07/14/2014, MD;  Location: [facility] OR;  Service: Orthopedics;  Laterality: Left;    Family History  Problem Relation Age of Onset    Diabetes Mother    Diabetes Sister    Diabetes Brother    Diabetes Sister    Diabetes Sister    Diabetes Brother     Social History   Socioeconomic History   Marital status: Married    Spouse name: Not on file   Number of children: Not on file   Years of education: Not on file   Highest education level: Not on file  Occupational History   Not on file  Tobacco Use   Smoking status: Never   Smokeless tobacco: Never  Vaping Use   Vaping Use: Never used  Substance and Sexual Activity   Alcohol use: No    Alcohol/week: 0.0 standard drinks of alcohol   Drug use: No   Sexual activity: Not Currently  Other Topics Concern   Not on file  Social History Narrative   Not on file   Social Determinants of Health   Financial Resource Strain: Not on file  Food Insecurity: Not on file  Transportation Needs: Not on file  Physical Activity: Inactive (01/28/2017)   Exercise Vital Sign    Days of Exercise per Week: 0 days    Minutes of Exercise per Session: 0 min  Stress: No Stress Concern Present (01/28/2017)   01/30/2017 of Occupational Health - Occupational Stress Questionnaire    Feeling of Stress : Not at all  Social Connections: Moderately Integrated (01/28/2017)   Social Connection and Isolation Panel [NHANES]    Frequency of Communication with  Friends and Family: More than three times a week    Frequency of Social Gatherings with Friends and Family: More than three times a week    Attends Religious Services: More than 4 times per year    Active Member of [employer] or Organizations: No    Attends Banker Meetings: Never    Marital Status: Married    No Known Allergies  Outpatient Medications Prior to Visit  Medication Sig Dispense Refill   Blood Glucose Monitoring Suppl (TRUE METRIX METER) DEVI 1 each by Does not apply route 3 (three) times daily before meals. 1 Device 0   glucose blood test strip USE AS DIRECTED THREE TIMES DAILY 100 each 12   Insulin  Pen Needle 31G X 5 MM MISC 1 each by Does not apply route at bedtime. 100 each 1   lisinopril (ZESTRIL) 2.5 MG tablet TAKE 1 TABLET (2.5 MG TOTAL) BY MOUTH DAILY. 30 tablet 6   loratadine (CLARITIN) 10 MG tablet Take 1 tablet (10 mg total) by mouth daily. 30 tablet 0   metFORMIN (GLUCOPHAGE) 1000 MG tablet TAKE 1 TABLET (1,000 MG TOTAL) BY MOUTH 2 (TWO) TIMES DAILY WITH A MEAL. 60 tablet 6   naproxen (NAPROSYN) 500 MG tablet Take 1 tablet (500 mg total) by mouth 2 (two) times daily with a meal. 14 tablet 0   ondansetron (ZOFRAN) 4 MG tablet Take 1 tablet (4 mg total) by mouth every 6 (six) hours. 15 tablet 0   TRUEPLUS LANCETS 28G MISC 1 each by Does not apply route 3 (three) times daily before meals. 100 each 12   atorvastatin (LIPITOR) 80 MG tablet Take 1 tablet (80 mg total) by mouth daily. 30 tablet 6   Dulaglutide (TRULICITY) 0.75 MG/0.5ML SOPN Inject 0.75 mg into the skin once a week. 2 mL 6   glipiZIDE (GLUCOTROL) 10 MG tablet TAKE 1 TABLET (10 MG TOTAL) BY MOUTH 2 (TWO) TIMES DAILY BEFORE A MEAL. 60 tablet 3   No facility-administered medications prior to visit.     ROS Review of Systems  Constitutional:  Negative for activity change and appetite change.  HENT:  Negative for sinus pressure and sore throat.   Respiratory:  Negative for chest tightness, shortness of breath and wheezing.   Cardiovascular:  Negative for chest pain and palpitations.  Gastrointestinal:  Negative for abdominal distention, abdominal pain and constipation.  Genitourinary: Negative.   Musculoskeletal: Negative.   Psychiatric/Behavioral:  Negative for behavioral problems and dysphoric mood.     Objective:  BP 129/73   Pulse 64   Temp 98.2 F (36.8 C) (Oral)   Ht 5\' 2"  (1.575 m)   Wt 145 lb (65.8 kg)   LMP 11/12/2013   SpO2 99%   BMI 26.52 kg/m      12/15/2021   11:17 AM 09/02/2021   11:41 AM 08/16/2021    7:49 AM  BP/Weight  Systolic BP 129 102 125  Diastolic BP 73 65 88  Wt. (Lbs) 145 141.4    BMI 26.52 kg/m2 25.86 kg/m2       Physical Exam Constitutional:      Appearance: She is well-developed.  Cardiovascular:     Rate and Rhythm: Normal rate.     Heart sounds: Normal heart sounds. No murmur heard. Pulmonary:     Effort: Pulmonary effort is normal.     Breath sounds: Normal breath sounds. No wheezing or rales.  Chest:     Chest wall: No tenderness.  Abdominal:  General: Bowel sounds are normal. There is no distension.     Palpations: Abdomen is soft. There is no mass.     Tenderness: There is no abdominal tenderness.  Musculoskeletal:        General: Normal range of motion.     Right lower leg: No edema.     Left lower leg: No edema.  Neurological:     Mental Status: She is alert and oriented to person, place, and time.  Psychiatric:        Mood and Affect: Mood normal.        Latest Ref Rng & Units 08/16/2021    1:06 AM 08/15/2021    3:38 AM 08/14/2021    3:58 PM  CMP  Glucose 70 - 99 mg/dL 99  86  381   BUN 6 - 20 mg/dL 5  10  14    Creatinine 0.44 - 1.00 mg/dL  8.29  9.37   Sodium 135 - 145 mmol/L 142  141  136   Potassium 3.5 - 5.1 mmol/L 3.7  3.5  3.6   Chloride 98 - 111 mmol/L 111  111  105   CO2 22 - 32 mmol/L 22  20  19    Calcium 8.9 - 10.3 mg/dL 8.9  8.2  8.6   Total Protein 6.5 - 8.1 g/dL   6.3   Total Bilirubin 0.3 - 1.2 mg/dL   0.7   Alkaline Phos 38 - 126 U/L   179   AST 15 - 41 U/L   54   ALT 0 - 44 U/L   29     Lipid Panel     Component Value Date/Time   CHOL 213 (H) 09/02/2021 1216   TRIG 73 09/02/2021 1216   HDL 38 (L) 09/02/2021 1216   CHOLHDL 2.9 04/22/2020 1205   CHOLHDL 3.3 04/14/2016 0928   VLDL 45 (H) 09/25/2015 1144   LDLCALC 162 (H) 09/02/2021 1216    CBC    Component Value Date/Time   WBC 3.4 (L) 08/16/2021 0106   RBC 3.31 (L) 08/16/2021 0106   HGB 9.6 (L) 08/16/2021 0106   HCT 29.5 (L) 08/16/2021 0106   PLT 165 08/16/2021 0106   MCV 89.1 08/16/2021 0106   MCH 29.0 08/16/2021 0106   MCHC 32.5  08/16/2021 0106   RDW 14.2 08/16/2021 0106   LYMPHSABS 0.9 08/16/2021 0106   MONOABS 0.3 08/16/2021 0106   EOSABS 0.0 08/16/2021 0106   BASOSABS 0.0 08/16/2021 0106    Lab Results  Component Value Date   HGBA1C 9.0 (A) 12/15/2021    Assessment & Plan:  1. Type 2 diabetes mellitus with other specified complication, without long-term current use of insulin (HCC) Uncontrolled with A1c of 9.0 which has trended up from 7.0 previously Goal A1c is less than 7.0 Increased dose of Trulicity from 0.75 to 1.5 mg I have provided her with a prescription for an annual eye exam and she will be going to the [facility] to have [facility] done Counseled on Diabetic diet, my plate method, 13/07/2021 minutes of moderate intensity exercise/week Blood sugar logs with fasting goals of 80-120 mg/dl, random of less than [place] and in the event of sugars less than 60 mg/dl or greater than 678 mg/dl encouraged to notify the clinic. Advised on the need for annual eye exams, annual foot exams, Pneumonia vaccine. - POCT glucose (manual entry) - POCT glycosylated hemoglobin (Hb A1C) - LP+Non-HDL Cholesterol - Microalbumin/Creatinine Ratio, Urine - Misc. Devices MISC;  Patient with Diabetes needing annual eye exam  Dispense: 1 each; Refill: 0 - atorvastatin (LIPITOR) 80 MG tablet; Take 1 tablet (80 mg total) by mouth daily.  Dispense: 30 tablet; Refill: 6 - Dulaglutide (TRULICITY) 1.5 KG/2.5KY SOPN; Inject 1.5 mg into the skin once a week.  Dispense: 2 mL; Refill: 6 - glipiZIDE (GLUCOTROL) 10 MG tablet; TAKE 1 TABLET (10 MG TOTAL) BY MOUTH 2 (TWO) TIMES DAILY BEFORE A MEAL.  Dispense: 60 tablet; Refill: 3  2. Screening for colon cancer - Fecal occult blood, imunochemical([facility])  3. Mixed hyperlipidemia Uncontrolled I will make no regimen changes if her cholesterol is still elevated as she has still been on the 40 mg rather than 80 mg of Lipitor - atorvastatin (LIPITOR) 80 MG tablet; Take 1 tablet (80 mg  total) by mouth daily.  Dispense: 30 tablet; Refill: 6   Meds ordered this encounter  Medications   Misc. Devices MISC    Sig: Patient with Diabetes needing annual eye exam    Dispense:  1 each    Refill:  0   atorvastatin (LIPITOR) 80 MG tablet    Sig: Take 1 tablet (80 mg total) by mouth daily.    Dispense:  30 tablet    Refill:  6    Dose increase   Dulaglutide (TRULICITY) 1.5 HC/6.2BJ SOPN    Sig: Inject 1.5 mg into the skin once a week.    Dispense:  2 mL    Refill:  6    Dose increase   glipiZIDE (GLUCOTROL) 10 MG tablet    Sig: TAKE 1 TABLET (10 MG TOTAL) BY MOUTH 2 (TWO) TIMES DAILY BEFORE A MEAL.    Dispense:  60 tablet    Refill:  3    Follow-up: Return in about 3 months (around 03/17/2022) for Chronic medical conditions.       Charlott Rakes, MD, FAAFP. [facility] and [place], [place]   12/15/2021, 11:51 AM

## 2021-12-15 NOTE — Patient Instructions (Signed)
Tumeric capsules do help with inflammation

## 2021-12-17 LAB — MICROALBUMIN / CREATININE URINE RATIO
Creatinine, Urine: 152.3 mg/dL
Microalb/Creat Ratio: 19 mg/g creat (ref 0–29)
Microalbumin, Urine: 29.6 ug/mL

## 2021-12-17 LAB — LP+NON-HDL CHOLESTEROL
Cholesterol, Total: 155 mg/dL (ref 100–199)
HDL: 43 mg/dL (ref >39)
LDL Chol Calc (NIH): 83 mg/dL (ref 0–99)
Total Non-HDL-Chol (LDL+VLDL): 112 mg/dL (ref 0–129)
Triglycerides: 168 mg/dL — ABNORMAL HIGH (ref 0–149)
VLDL Cholesterol Cal: 29 mg/dL (ref 5–40)

## 2022-01-19 ENCOUNTER — Other Ambulatory Visit: Payer: Self-pay

## 2022-03-02 ENCOUNTER — Other Ambulatory Visit: Payer: Self-pay

## 2022-03-18 ENCOUNTER — Encounter: Payer: Self-pay | Admitting: Family Medicine

## 2022-03-18 ENCOUNTER — Other Ambulatory Visit: Payer: Self-pay

## 2022-03-18 ENCOUNTER — Ambulatory Visit: Payer: Self-pay | Attending: Family Medicine | Admitting: Family Medicine

## 2022-03-18 VITALS — BP 115/69 | HR 60 | Temp 98.1°F | Ht 62.0 in | Wt 146.2 lb

## 2022-03-18 DIAGNOSIS — Z1231 Encounter for screening mammogram for malignant neoplasm of breast: Secondary | ICD-10-CM

## 2022-03-18 DIAGNOSIS — Z1211 Encounter for screening for malignant neoplasm of colon: Secondary | ICD-10-CM

## 2022-03-18 DIAGNOSIS — E1169 Type 2 diabetes mellitus with other specified complication: Secondary | ICD-10-CM

## 2022-03-18 DIAGNOSIS — I10 Essential (primary) hypertension: Secondary | ICD-10-CM

## 2022-03-18 DIAGNOSIS — Z634 Disappearance and death of family member: Secondary | ICD-10-CM

## 2022-03-18 LAB — POCT GLYCOSYLATED HEMOGLOBIN (HGB A1C): HbA1c, POC (controlled diabetic range): 7.7 % — AB (ref 0.0–7.0)

## 2022-03-18 LAB — GLUCOSE, POCT (MANUAL RESULT ENTRY): POC Glucose: 156 mg/dl — AB (ref 70–99)

## 2022-03-18 MED ORDER — TRULICITY 3 MG/0.5ML ~~LOC~~ SOAJ
3.0000 mg | SUBCUTANEOUS | 6 refills | Status: DC
  Filled 2022-03-18: qty 2, 28d supply, fill #0
  Filled 2022-04-13: qty 2, 28d supply, fill #1

## 2022-03-18 NOTE — Patient Instructions (Signed)
Duelo prolongado Prolonged Grief El duelo es [place] normal a la muerte de una persona Administrator, Civil Service. Casi todos los que pierden a un ser querido pueden verse afectados por sentimientos de Control and instrumentation engineer, enojo y Biochemist, clinical. Tambin es comn que haya sntomas de depresin durante el duelo. entre ellos, problemas para dormir, falta de apetito y falta de Teacher, early years/pre. que pueden durar [employer]  o meses despus de la prdida. El duelo prolongado es diferente al duelo normal o a la depresin. El duelo normal conlleva tristeza y sentimientos de prdida, pero esas sensaciones mejoran y sanan con Physiological scientist. El duelo prolongado es un tipo grave de duelo que dura mucho tiempo, por lo general, varios meses, un ao o ms. e interfiere en su capacidad de actuar con normalidad. El duelo prolongado puede requerir tratamiento a cargo de [place] en salud mental. Cules son las causas? Se desconoce la causa de esta afeccin. No est claro por qu algunas personas siguen lidiando con el duelo y [place] no. Qu incrementa el riesgo? Es ms probable que contraiga esta afeccin si: La muerte del ser querido fue repentina o inesperada. La muerte del ser querido se debi a un episodio violento. La causa de la muerte de su ser querido fue suicidio. El ser querido era un nio o una persona joven. Usted tena una conexin muy fuerte con su ser querido, o dependa de l o ella. Tiene antecedentes de depresin o ansiedad. Tiene muy poco apoyo de los dems o no tiene ninguno. Cules son los signos o sntomas? Los sntomas de esta afeccin incluyen: Sentimientos de incredulidad o falta de emociones (embotamiento afectivo). Incapacidad de disfrutar de los buenos recuerdos del ser querido. Necesidad de Product/process development scientist todo o algunas cosas que le recuerdan al ser querido. Incapacidad de dejar de pensar acerca de la muerte. Sensacin de ira intensa, soledad, impotencia o culpa. Sensacin de que su vida no tiene sentido y est vaca, y Best boy  dificultad para seguir adelante con su vida. Cmo se diagnostica? Esta afeccin se puede diagnosticar en funcin de lo siguiente: Sus sntomas. El duelo prolongado se diagnosticar si tiene sntomas de duelo continuos durante 6 meses para los nios y 12 meses o ms para los adultos. El efecto de los sntomas en su vida. Pueden diagnosticarle esta afeccin si los sntomas interfieren en su capacidad de llevar una vida normal. Es posible que el mdico le recomiende ver a Teaching laboratory technician en salud mental. Muchos sntomas de depresin son similares a los del duelo prolongado. Es importante someterse a evaluaciones para diagnosticar el duelo prolongado junto con otras afecciones de salud mental. Cmo se trata? Esta afeccin se trata frecuentemente con psicoterapia. Esta terapia la brinde Teaching laboratory technician en salud mental (psiquiatra). Durante la terapia: Aprender formas positivas de sobrellevar la prdida de su ser querido. El Buyer, retail en salud mental tambin puede indicarle antidepresivos. Siga estas instrucciones en su casa: Estilo de vida  [place]ud[place]. Coma con regularidad y siga una dieta saludable. Coma muchas frutas, verduras, protenas magras y cereales integrales. Intente hacer algo de ejercicio [employer]. Haga 30 minutos de ejercicio la [employer] de la [place]. Mantenga un esquema de descanso sistemtico. Trate de dormir 8 horas o ms todas las noches. Comience a hacer cosas que sola disfrutar. No consuma drogas ni alcohol para aliviar los sntomas. Pase tiempo con amigos y seres queridos. Instrucciones generales Use los medicamentos de venta libre y los recetados solamente como se lo haya indicado el mdico. Considere la posibilidad de [place]  a un grupo de apoyo para casos de duelo (sentimiento de prdida) como ayuda para sobrellevar la prdida. Concurra a [place]. Esto es importante. Comunquese con un mdico si: Sus sntomas le impiden  desempear sus funciones con normalidad. Los sntomas no mejoran con Dispensing optician. Solicite ayuda de inmediato si: Piensa seriamente en lastimarse o lastimar a Nurse, children's. Tiene sentimientos suicidas. Busque ayuda de inmediato si alguna vez siente que puede hacerse dao a usted mismo o a otros, o tiene pensamientos de Doctor, hospital a su vida. Dirjase al centro de urgencias ms cercano o: Llame al 911. Llame a National Suicide Prevention Lifeline ([place] para la Prevencin del Suicidio) al [phone] o al 988. Est disponible las 24 horas del da. Enve un mensaje de texto a la lnea para casos de crisis al [phone]. Resumen El duelo prolongado es un tipo grave de duelo que dura [place]. Es probable que este duelo no desaparecer por s solo. Solicite la [employer] necesite. Algunos duelos son ms difciles que otros y pueden causar esta afeccin. Tal vez necesite un tipo de tratamiento para ayudar a recuperarse si la prdida de su ser querido fue repentina, violenta o la cause de un suicidio. Tal vez se sienta culpable por seguir con su vida. Recibir ayuda no significa que usted se est olvidando de su ser querido. Significa que se est cuidado a s mismo. El duelo prolongado se trata mejor con psicoterapia. Adems, es posible que le receten medicamentos. Solicite la [employer] necesite, y encuentre el apoyo que lo ayudar a recuperarse. Esta informacin no tiene Marine scientist el consejo del mdico. Asegrese de hacerle al mdico cualquier pregunta que tenga. Document Revised: 10/11/2020 Document Reviewed: 10/11/2020 Elsevier Patient Education  [place].

## 2022-03-18 NOTE — Progress Notes (Signed)
Subjective:  Patient ID: Margaret Pace, female    DOB: [birth year]  Age: 57 y.o. MRN: [id number]  CC: Diabetes   HPI Margaret Pace is a 57 y.o. year old female with a history of type 2 diabetes mellitus (A1c 7.7), hypertension, hyperlipidemia.   Interval History:  She states she is always thinking of her daughter who passed away a little over 2 years ago.  She never underwent grief counseling and is not interested in doing so.  She denies suicidal ideations or intent, denies presence of insomnia.  A1c is 7.7 down from 9.0 and she is doing well on Trulicity, glipizide, metformin.  She denies hypoglycemic episodes or neuropathy. Not up-to-date on annual eye exams due to lack of medical coverage. Tolerating her antihypertensive and her statin okay with no adverse effects. Denies pensive additional concerns. Past Medical History:  Diagnosis Date   Anemia    Pt reported blood transfusion after left leg fracture.   Carpal tunnel syndrome, bilateral    Diabetes mellitus without complication [facility])     Past Surgical History:  Procedure Laterality Date   APPLICATION OF WOUND VAC Left 05/14/2014   Procedure: APPLICATION OF WOUND VAC;  Surgeon: Myrene Galas, MD;  Location: [facility] OR;  Service: Orthopedics;  Laterality: Left;   FASCIOTOMY Left 05/14/2014   Procedure: FOUR COMPARTMENT FASCIOTOMY;  Surgeon: Myrene Galas, MD;  Location: [facility] OR;  Service: Orthopedics;  Laterality: Left;   FRACTURE SURGERY     HARDWARE REMOVAL Left 10/19/2014   Procedure: HARDWARE REMOVAL LEFT TIBIA AND ANKLE;  Surgeon: Myrene Galas, MD;  Location: [facility] OR;  Service: Orthopedics;  Laterality: Left;   SECONDARY CLOSURE OF WOUND Left 05/17/2014   Procedure: WOUND CLOSURE LEFT LEG ;  Surgeon: Myrene Galas, MD;  Location: [facility] OR;  Service: Orthopedics;  Laterality: Left;   TIBIA IM NAIL INSERTION Left 05/14/2014   Procedure: INTRAMEDULLARY (IM) NAIL TIBIAL;  Surgeon: Myrene Galas, MD;  Location: [facility] OR;  Service:  Orthopedics;  Laterality: Left;    Family History  Problem Relation Age of Onset   Diabetes Mother    Diabetes Sister    Diabetes Brother    Diabetes Sister    Diabetes Sister    Diabetes Brother     Social History   Socioeconomic History   Marital status: Married    Spouse name: Not on file   Number of children: Not on file   Years of education: Not on file   Highest education level: Not on file  Occupational History   Not on file  Tobacco Use   Smoking status: Never   Smokeless tobacco: Never  Vaping Use   Vaping Use: Never used  Substance and Sexual Activity   Alcohol use: No    Alcohol/week: 0.0 standard drinks of alcohol   Drug use: No   Sexual activity: Not Currently  Other Topics Concern   Not on file  Social History Narrative   Not on file   Social Determinants of Health   Financial Resource Strain: Not on file  Food Insecurity: Not on file  Transportation Needs: Not on file  Physical Activity: Inactive (01/28/2017)   Exercise Vital Sign    Days of Exercise per Week: 0 days    Minutes of Exercise per Session: 0 min  Stress: No Stress Concern Present (01/28/2017)   [employer] of Occupational Health - Occupational Stress Questionnaire    Feeling of Stress : Not at all  Social Connections: Moderately Integrated (01/28/2017)  Social Licensed conveyancer [NHANES]    Frequency of Communication with Friends and Family: More than three times a week    Frequency of Social Gatherings with Friends and Family: More than three times a week    Attends Religious Services: More than 4 times per year    Active Member of [employer] or Organizations: No    Attends Archivist Meetings: Never    Marital Status: Married    No Known Allergies  Outpatient Medications Prior to Visit  Medication Sig Dispense Refill   atorvastatin (LIPITOR) 80 MG tablet Take 1 tablet (80 mg total) by mouth daily. 30 tablet 6   Blood Glucose Monitoring Suppl (TRUE  METRIX METER) DEVI 1 each by Does not apply route 3 (three) times daily before meals. 1 Device 0   glipiZIDE (GLUCOTROL) 10 MG tablet TAKE 1 TABLET (10 MG TOTAL) BY MOUTH 2 (TWO) TIMES DAILY BEFORE A MEAL. 60 tablet 3   glucose blood test strip USE AS DIRECTED THREE TIMES DAILY 100 each 12   Insulin Pen Needle 31G X 5 MM MISC 1 each by Does not apply route at bedtime. 100 each 1   lisinopril (ZESTRIL) 2.5 MG tablet TAKE 1 TABLET (2.5 MG TOTAL) BY MOUTH DAILY. 30 tablet 6   loratadine (CLARITIN) 10 MG tablet Take 1 tablet (10 mg total) by mouth daily. 30 tablet 0   metFORMIN (GLUCOPHAGE) 1000 MG tablet TAKE 1 TABLET (1,000 MG TOTAL) BY MOUTH 2 (TWO) TIMES DAILY WITH A MEAL. 60 tablet 6   Misc. Devices [place] Patient with Diabetes needing annual eye exam 1 each 0   naproxen (NAPROSYN) 500 MG tablet Take 1 tablet (500 mg total) by mouth 2 (two) times daily with a meal. 14 tablet 0   ondansetron (ZOFRAN) 4 MG tablet Take 1 tablet (4 mg total) by mouth every 6 (six) hours. 15 tablet 0   TRUEPLUS LANCETS 28G MISC 1 each by Does not apply route 3 (three) times daily before meals. 100 each 12   Dulaglutide (TRULICITY) 1.5 WV/3.7TG SOPN Inject 1.5 mg into the skin once a week. 2 mL 6   No facility-administered medications prior to visit.     ROS Review of Systems  Constitutional:  Negative for activity change and appetite change.  HENT:  Negative for sinus pressure and sore throat.   Respiratory:  Negative for chest tightness, shortness of breath and wheezing.   Cardiovascular:  Negative for chest pain and palpitations.  Gastrointestinal:  Negative for abdominal distention, abdominal pain and constipation.  Genitourinary: Negative.   Musculoskeletal: Negative.   Psychiatric/Behavioral:  Positive for dysphoric mood. Negative for behavioral problems.     Objective:  BP 115/69   Pulse 60   Temp 98.1 F (36.7 C) (Oral)   Ht 5\' 2"  (1.575 m)   Wt 146 lb 3.2 oz (66.3 kg)   LMP 11/12/2013   SpO2  100%   BMI 26.74 kg/m      03/18/2022   10:17 AM 12/15/2021   11:17 AM 09/02/2021   11:41 AM  BP/Weight  Systolic BP 626 948 546  Diastolic BP 69 73 65  Wt. (Lbs) 146.2 145 141.4  BMI 26.74 kg/m2 26.52 kg/m2 25.86 kg/m2      Physical Exam Constitutional:      Appearance: She is well-developed.  Cardiovascular:     Rate and Rhythm: Normal rate.     Heart sounds: Normal heart sounds. No murmur heard. Pulmonary:     Effort:  Pulmonary effort is normal.     Breath sounds: Normal breath sounds. No wheezing or rales.  Chest:     Chest wall: No tenderness.  Abdominal:     General: Bowel sounds are normal. There is no distension.     Palpations: Abdomen is soft. There is no mass.     Tenderness: There is no abdominal tenderness.  Musculoskeletal:        General: Normal range of motion.     Right lower leg: No edema.     Left lower leg: No edema.  Neurological:     Mental Status: She is alert and oriented to person, place, and time.  Psychiatric:     Comments: Dysphoric mood        Latest Ref Rng & Units 08/16/2021    1:06 AM 08/15/2021    3:38 AM 08/14/2021    3:58 PM  CMP  Glucose 70 - 99 mg/dL 99  86  150   BUN 6 - 20 mg/dL 5  10  14    Creatinine 0.44 - 1.00 mg/dL 0.84  0.87  1.03   Sodium 135 - 145 mmol/L 142  141  136   Potassium 3.5 - 5.1 mmol/L 3.7  3.5  3.6   Chloride 98 - 111 mmol/L 111  111  105   CO2 22 - 32 mmol/L 22  20  19    Calcium 8.9 - 10.3 mg/dL 8.9  8.2  8.6   Total Protein 6.5 - 8.1 g/dL   6.3   Total Bilirubin 0.3 - 1.2 mg/dL   0.7   Alkaline Phos 38 - 126 U/L   179   AST 15 - 41 U/L   54   ALT 0 - 44 U/L   29     Lipid Panel     Component Value Date/Time   CHOL 155 12/15/2021 1149   TRIG 168 (H) 12/15/2021 1149   HDL 43 12/15/2021 1149   CHOLHDL 2.9 04/22/2020 1205   CHOLHDL 3.3 04/14/2016 0928   VLDL 45 (H) 09/25/2015 1144   LDLCALC 83 12/15/2021 1149    CBC    Component Value Date/Time   WBC 3.4 (L) 08/16/2021 0106   RBC 3.31 (L)  08/16/2021 0106   HGB 9.6 (L) 08/16/2021 0106   HCT 29.5 (L) 08/16/2021 0106   PLT 165 08/16/2021 0106   MCV 89.1 08/16/2021 0106   MCH 29.0 08/16/2021 0106   MCHC 32.5 08/16/2021 0106   RDW 14.2 08/16/2021 0106   LYMPHSABS 0.9 08/16/2021 0106   MONOABS 0.3 08/16/2021 0106   EOSABS 0.0 08/16/2021 0106   BASOSABS 0.0 08/16/2021 0106    Lab Results  Component Value Date   HGBA1C 7.7 (A) 03/18/2022    Assessment & Plan:  1. Type 2 diabetes mellitus with other specified complication, without long-term current use of insulin (HCC) Not fully optimized with A1c of 7.7, goal is less than 7.0 Increase dose of Trulicity from 1.5 mg to 3 mg Counseled on Diabetic diet, my plate method, 893 minutes of moderate intensity exercise/week Blood sugar logs with fasting goals of 80-120 mg/dl, random of less than 180 and in the event of sugars less than 60 mg/dl or greater than 400 mg/dl encouraged to notify the clinic. Advised on the need for annual eye exams, annual foot exams, Pneumonia vaccine. - POCT glucose (manual entry) - POCT glycosylated hemoglobin (Hb A1C) - Dulaglutide (TRULICITY) 3 YB/0.1BP SOPN; Inject 3 mg as directed once a week.  Dispense:  2 mL; Refill: 6 - Microalbumin / creatinine urine ratio - LP+Non-HDL Cholesterol - CMP14+EGFR  2. Encounter for screening mammogram for malignant neoplasm of breast - [place] DIGITAL SCREENING TOMO BILATERAL; Future  3. Screening for colon cancer - Fecal occult blood, imunochemical([facility])  4. Essential hypertension Controlled Continue current antihypertensive Counseled on blood pressure goal of less than 130/80, low-sodium, DASH diet, medication compliance, 150 minutes of moderate intensity exercise per week. Discussed medication compliance, adverse effects.  5. Bereavement Shared resources for grief counseling but she is not interested She does not have any problems with her sleep and declines medications for  depression.    Meds ordered this encounter  Medications   Dulaglutide (TRULICITY) 3 VZ/8.5YI SOPN    Sig: Inject 3 mg as directed once a week.    Dispense:  2 mL    Refill:  6    Discontinue 1.5mg     Follow-up: No follow-ups on file.       Charlott Rakes, MD, FAAFP. [facility] [place], [place]   03/18/2022, 12:45 PM

## 2022-03-20 ENCOUNTER — Other Ambulatory Visit: Payer: Self-pay | Admitting: Family Medicine

## 2022-03-20 DIAGNOSIS — R748 Abnormal levels of other serum enzymes: Secondary | ICD-10-CM

## 2022-03-20 LAB — CMP14+EGFR
ALT: 10 IU/L (ref 0–32)
AST: 15 IU/L (ref 0–40)
Albumin/Globulin Ratio: 1.4 (ref 1.2–2.2)
Albumin: 4.2 g/dL (ref 3.8–4.9)
Alkaline Phosphatase: 165 IU/L — ABNORMAL HIGH (ref 44–121)
BUN/Creatinine Ratio: 13 (ref 9–23)
BUN: 11 mg/dL (ref 6–24)
Bilirubin Total: 0.3 mg/dL (ref 0.0–1.2)
CO2: 21 mmol/L (ref 20–29)
Calcium: 10 mg/dL (ref 8.7–10.2)
Chloride: 104 mmol/L (ref 96–106)
Creatinine, Ser: 0.83 mg/dL (ref 0.57–1.00)
Globulin, Total: 2.9 g/dL (ref 1.5–4.5)
Glucose: 155 mg/dL — ABNORMAL HIGH (ref 70–99)
Potassium: 4.6 mmol/L (ref 3.5–5.2)
Sodium: 142 mmol/L (ref 134–144)
Total Protein: 7.1 g/dL (ref 6.0–8.5)
eGFR: 83 mL/min/{1.73_m2} (ref >59)

## 2022-03-20 LAB — MICROALBUMIN / CREATININE URINE RATIO
Creatinine, Urine: 51.2 mg/dL
Microalb/Creat Ratio: 21 mg/g creat (ref 0–29)
Microalbumin, Urine: 10.9 ug/mL

## 2022-03-20 LAB — LP+NON-HDL CHOLESTEROL
Cholesterol, Total: 108 mg/dL (ref 100–199)
HDL: 46 mg/dL (ref >39)
LDL Chol Calc (NIH): 47 mg/dL (ref 0–99)
Total Non-HDL-Chol (LDL+VLDL): 62 mg/dL (ref 0–129)
Triglycerides: 72 mg/dL (ref 0–149)
VLDL Cholesterol Cal: 15 mg/dL (ref 5–40)

## 2022-03-26 ENCOUNTER — Ambulatory Visit (HOSPITAL_COMMUNITY)
Admission: RE | Admit: 2022-03-26 | Discharge: 2022-03-26 | Disposition: A | Discharge status: Home / Self Care | Payer: Self-pay | Source: Ambulatory Visit | Attending: Family Medicine | Admitting: Family Medicine

## 2022-03-26 DIAGNOSIS — R748 Abnormal levels of other serum enzymes: Secondary | ICD-10-CM | POA: Insufficient documentation

## 2022-04-01 ENCOUNTER — Other Ambulatory Visit: Payer: Self-pay

## 2022-04-01 DIAGNOSIS — Z1231 Encounter for screening mammogram for malignant neoplasm of breast: Secondary | ICD-10-CM

## 2022-04-10 ENCOUNTER — Ambulatory Visit
Admission: RE | Admit: 2022-04-10 | Discharge: 2022-04-10 | Disposition: A | Discharge status: Home / Self Care | Payer: No Typology Code available for payment source | Source: Ambulatory Visit | Attending: Family Medicine | Admitting: Family Medicine

## 2022-04-10 DIAGNOSIS — Z1231 Encounter for screening mammogram for malignant neoplasm of breast: Secondary | ICD-10-CM

## 2022-04-13 ENCOUNTER — Other Ambulatory Visit: Payer: Self-pay

## 2022-04-14 ENCOUNTER — Other Ambulatory Visit: Payer: Self-pay

## 2022-04-14 ENCOUNTER — Other Ambulatory Visit: Payer: Self-pay | Admitting: Family Medicine

## 2022-04-14 MED ORDER — SEMAGLUTIDE (2 MG/DOSE) 8 MG/3ML ~~LOC~~ SOPN
2.0000 mg | PEN_INJECTOR | SUBCUTANEOUS | 6 refills | Status: DC
  Filled 2022-04-14: qty 3, 28d supply, fill #0
  Filled 2022-06-14 – 2022-06-15 (×2): qty 3, 28d supply, fill #1
  Filled 2022-07-27 (×2): qty 3, 28d supply, fill #2

## 2022-06-14 ENCOUNTER — Other Ambulatory Visit: Payer: Self-pay | Admitting: Family Medicine

## 2022-06-14 DIAGNOSIS — E1169 Type 2 diabetes mellitus with other specified complication: Secondary | ICD-10-CM

## 2022-06-15 ENCOUNTER — Other Ambulatory Visit: Payer: Self-pay

## 2022-06-15 MED ORDER — GLIPIZIDE 10 MG PO TABS
10.0000 mg | ORAL_TABLET | Freq: Two times a day (BID) | ORAL | 0 refills | Status: DC
  Filled 2022-06-15: qty 180, 90d supply, fill #0

## 2022-07-08 ENCOUNTER — Other Ambulatory Visit: Payer: Self-pay

## 2022-07-27 ENCOUNTER — Other Ambulatory Visit: Payer: Self-pay

## 2022-07-27 ENCOUNTER — Other Ambulatory Visit: Payer: Self-pay | Admitting: Family Medicine

## 2022-07-27 DIAGNOSIS — I10 Essential (primary) hypertension: Secondary | ICD-10-CM

## 2022-07-27 DIAGNOSIS — E1169 Type 2 diabetes mellitus with other specified complication: Secondary | ICD-10-CM

## 2022-07-27 MED ORDER — LISINOPRIL 2.5 MG PO TABS
ORAL_TABLET | Freq: Every day | ORAL | 0 refills | Status: DC
  Filled 2022-07-27: qty 30, 30d supply, fill #0

## 2022-07-27 MED ORDER — GLIPIZIDE 10 MG PO TABS
10.0000 mg | ORAL_TABLET | Freq: Two times a day (BID) | ORAL | 0 refills | Status: DC
  Filled 2022-07-27: qty 60, 30d supply, fill #0

## 2022-07-29 ENCOUNTER — Other Ambulatory Visit: Payer: Self-pay

## 2022-08-25 ENCOUNTER — Other Ambulatory Visit: Payer: Self-pay

## 2022-08-25 ENCOUNTER — Ambulatory Visit: Payer: Self-pay | Attending: Family Medicine | Admitting: Family Medicine

## 2022-08-25 ENCOUNTER — Encounter: Payer: Self-pay | Admitting: Family Medicine

## 2022-08-25 VITALS — BP 119/72 | HR 68 | Temp 98.2°F | Ht 62.0 in | Wt 141.2 lb

## 2022-08-25 DIAGNOSIS — M549 Dorsalgia, unspecified: Secondary | ICD-10-CM

## 2022-08-25 DIAGNOSIS — E1169 Type 2 diabetes mellitus with other specified complication: Secondary | ICD-10-CM

## 2022-08-25 DIAGNOSIS — Z1211 Encounter for screening for malignant neoplasm of colon: Secondary | ICD-10-CM

## 2022-08-25 DIAGNOSIS — I10 Essential (primary) hypertension: Secondary | ICD-10-CM

## 2022-08-25 DIAGNOSIS — E782 Mixed hyperlipidemia: Secondary | ICD-10-CM

## 2022-08-25 LAB — POCT GLYCOSYLATED HEMOGLOBIN (HGB A1C): HbA1c, POC (controlled diabetic range): 8.1 % — AB (ref 0.0–7.0)

## 2022-08-25 LAB — GLUCOSE, POCT (MANUAL RESULT ENTRY): POC Glucose: 79 mg/dl (ref 70–99)

## 2022-08-25 MED ORDER — METFORMIN HCL 1000 MG PO TABS
1000.0000 mg | ORAL_TABLET | Freq: Two times a day (BID) | ORAL | 6 refills | Status: DC
Start: 2022-08-25 — End: 2023-03-03
  Filled 2022-08-25: qty 60, 30d supply, fill #0
  Filled 2022-10-04 – 2022-10-05 (×3): qty 60, 30d supply, fill #1
  Filled 2022-11-09 (×2): qty 60, 30d supply, fill #2
  Filled 2022-12-14 – 2022-12-15 (×2): qty 60, 30d supply, fill #3
  Filled 2023-02-01 – 2023-02-02 (×2): qty 60, 30d supply, fill #4

## 2022-08-25 MED ORDER — DICLOFENAC SODIUM 1 % EX GEL
4.0000 g | Freq: Four times a day (QID) | CUTANEOUS | 1 refills | Status: AC
  Filled 2022-08-25: qty 100, fill #0

## 2022-08-25 MED ORDER — GLIPIZIDE 10 MG PO TABS
10.0000 mg | ORAL_TABLET | Freq: Two times a day (BID) | ORAL | 6 refills | Status: DC
Start: 2022-08-25 — End: 2023-03-03
  Filled 2022-08-25 – 2022-10-05 (×3): qty 60, 30d supply, fill #0
  Filled 2022-11-09 (×2): qty 60, 30d supply, fill #1
  Filled 2022-12-14 – 2022-12-15 (×2): qty 60, 30d supply, fill #2
  Filled 2023-02-01 – 2023-02-02 (×2): qty 60, 30d supply, fill #3

## 2022-08-25 MED ORDER — SEMAGLUTIDE (2 MG/DOSE) 8 MG/3ML ~~LOC~~ SOPN
2.0000 mg | PEN_INJECTOR | SUBCUTANEOUS | 6 refills | Status: DC
Start: 2022-08-25 — End: 2023-03-03
  Filled 2022-08-25: qty 3, 28d supply, fill #0
  Filled 2022-10-04 – 2022-10-05 (×2): qty 3, 28d supply, fill #1
  Filled 2022-11-09 (×2): qty 3, 28d supply, fill #2
  Filled 2022-12-14 – 2022-12-15 (×2): qty 3, 28d supply, fill #3
  Filled 2023-02-01 – 2023-02-02 (×2): qty 3, 28d supply, fill #4

## 2022-08-25 MED ORDER — DAPAGLIFLOZIN PROPANEDIOL 10 MG PO TABS
10.0000 mg | ORAL_TABLET | Freq: Every day | ORAL | 6 refills | Status: DC
  Filled 2022-08-25: qty 30, 30d supply, fill #0

## 2022-08-25 MED ORDER — LISINOPRIL 2.5 MG PO TABS
ORAL_TABLET | Freq: Every day | ORAL | 6 refills | Status: DC
Start: 2022-08-25 — End: 2023-03-03
  Filled 2022-08-25: qty 30, 30d supply, fill #0
  Filled 2022-10-04 – 2022-10-05 (×2): qty 30, 30d supply, fill #1
  Filled 2022-11-09 (×2): qty 30, 30d supply, fill #2
  Filled 2022-12-14 – 2022-12-15 (×2): qty 30, 30d supply, fill #3
  Filled 2023-02-01 – 2023-02-02 (×2): qty 30, 30d supply, fill #4

## 2022-08-25 MED ORDER — ATORVASTATIN CALCIUM 80 MG PO TABS
80.0000 mg | ORAL_TABLET | Freq: Every day | ORAL | 6 refills | Status: DC
  Filled 2022-08-25: qty 30, 30d supply, fill #0
  Filled 2022-10-04 – 2022-10-05 (×2): qty 30, 30d supply, fill #1
  Filled 2022-11-09 (×2): qty 30, 30d supply, fill #2
  Filled 2022-12-14 – 2022-12-15 (×2): qty 30, 30d supply, fill #3
  Filled 2023-02-01 – 2023-02-02 (×2): qty 30, 30d supply, fill #4

## 2022-08-25 NOTE — Progress Notes (Signed)
Subjective:  Patient ID: Margaret Pace, female    DOB: [birth year]  Age: 57 y.o. MRN: [id number]  CC: Diabetes   HPI Margaret Pace is a 57 y.o. year old female with a history of  type 2 diabetes mellitus (A1c 8.1), hypertension, hyperlipidemia.   Interval History: Discussed the use of AI scribe software for clinical note transcription with the patient, who gave verbal consent to proceed.  She presents for a follow-up visit. She reports adherence to her current medication regimen, which includes Metformin twice daily and a weekly Ozempic 2mg  injection. Despite this, her HbA1c has increased from 7.7 to 8.1. She denies any changes in her diet, which remains consistent.  In addition to her diabetes, the patient reports chronic back pain, particularly when lying down or sitting for extended periods. The pain is localized to the back and does not radiate to the legs.        Past Medical History:  Diagnosis Date   Anemia    Pt reported blood transfusion after left leg fracture.   Carpal tunnel syndrome, bilateral    Diabetes mellitus without complication [facility])     Past Surgical History:  Procedure Laterality Date   APPLICATION OF WOUND VAC Left 05/14/2014   Procedure: APPLICATION OF WOUND VAC;  Surgeon: Myrene Galas, MD;  Location: [facility] OR;  Service: Orthopedics;  Laterality: Left;   FASCIOTOMY Left 05/14/2014   Procedure: FOUR COMPARTMENT FASCIOTOMY;  Surgeon: Myrene Galas, MD;  Location: [facility] OR;  Service: Orthopedics;  Laterality: Left;   FRACTURE SURGERY     HARDWARE REMOVAL Left 10/19/2014   Procedure: HARDWARE REMOVAL LEFT TIBIA AND ANKLE;  Surgeon: Myrene Galas, MD;  Location: [facility] OR;  Service: Orthopedics;  Laterality: Left;   SECONDARY CLOSURE OF WOUND Left 05/17/2014   Procedure: WOUND CLOSURE LEFT LEG ;  Surgeon: Myrene Galas, MD;  Location: [facility] OR;  Service: Orthopedics;  Laterality: Left;   TIBIA IM NAIL INSERTION Left 05/14/2014   Procedure: INTRAMEDULLARY (IM) NAIL  TIBIAL;  Surgeon: Myrene Galas, MD;  Location: [facility] OR;  Service: Orthopedics;  Laterality: Left;    Family History  Problem Relation Age of Onset   Diabetes Mother    Diabetes Sister    Diabetes Sister    Diabetes Sister    Diabetes Brother    Diabetes Brother    Breast cancer Neg Hx     Social History   Socioeconomic History   Marital status: Legally Separated    Spouse name: Not on file   Number of children: Not on file   Years of education: Not on file   Highest education level: Not on file  Occupational History   Not on file  Tobacco Use   Smoking status: Never   Smokeless tobacco: Never  Vaping Use   Vaping status: Never Used  Substance and Sexual Activity   Alcohol use: No    Alcohol/week: 0.0 standard drinks of alcohol   Drug use: No   Sexual activity: Not Currently  Other Topics Concern   Not on file  Social History Narrative   Not on file   Social Determinants of Health   Financial Resource Strain: Not on file  Food Insecurity: Not on file  Transportation Needs: Not on file  Physical Activity: Inactive (01/28/2017)   Exercise Vital Sign    Days of Exercise per Week: 0 days    Minutes of Exercise per Session: 0 min  Stress: No Stress Concern Present (01/28/2017)   [employer]  of Occupational Health - Occupational Stress Questionnaire    Feeling of Stress : Not at all  Social Connections: Moderately Integrated (01/28/2017)   Social Connection and Isolation Panel [NHANES]    Frequency of Communication with Friends and Family: More than three times a week    Frequency of Social Gatherings with Friends and Family: More than three times a week    Attends Religious Services: More than 4 times per year    Active Member of [employer] or Organizations: No    Attends Banker Meetings: Never    Marital Status: Married    No Known Allergies  Outpatient Medications Prior to Visit  Medication Sig Dispense Refill   Blood Glucose Monitoring Suppl  (TRUE METRIX METER) DEVI 1 each by Does not apply route 3 (three) times daily before meals. 1 Device 0   glucose blood test strip USE AS DIRECTED THREE TIMES DAILY 100 each 12   Insulin Pen Needle 31G X 5 MM MISC 1 each by Does not apply route at bedtime. 100 each 1   loratadine (CLARITIN) 10 MG tablet Take 1 tablet (10 mg total) by mouth daily. 30 tablet 0   Misc. Devices MISC Patient with Diabetes needing annual eye exam 1 each 0   naproxen (NAPROSYN) 500 MG tablet Take 1 tablet (500 mg total) by mouth 2 (two) times daily with a meal. 14 tablet 0   ondansetron (ZOFRAN) 4 MG tablet Take 1 tablet (4 mg total) by mouth every 6 (six) hours. 15 tablet 0   TRUEPLUS LANCETS 28G MISC 1 each by Does not apply route 3 (three) times daily before meals. 100 each 12   atorvastatin (LIPITOR) 80 MG tablet Take 1 tablet (80 mg total) by mouth daily. 30 tablet 6   glipiZIDE (GLUCOTROL) 10 MG tablet Take 1 tablet (10 mg total) by mouth 2 (two) times daily before a meal. 60 tablet 0   lisinopril (ZESTRIL) 2.5 MG tablet TAKE 1 TABLET (2.5 MG TOTAL) BY MOUTH DAILY. 30 tablet 0   metFORMIN (GLUCOPHAGE) 1000 MG tablet TAKE 1 TABLET (1,000 MG TOTAL) BY MOUTH 2 (TWO) TIMES DAILY WITH A MEAL. 60 tablet 6   Semaglutide, 2 MG/DOSE, 8 MG/3ML SOPN Inject 2 mg as directed once a week. 3 mL 6   No facility-administered medications prior to visit.     ROS Review of Systems  Constitutional:  Negative for activity change, appetite change and fatigue.  HENT:  Negative for congestion, sinus pressure and sore throat.   Eyes:  Negative for visual disturbance.  Respiratory:  Negative for cough, chest tightness, shortness of breath and wheezing.   Cardiovascular:  Negative for chest pain and palpitations.  Gastrointestinal:  Negative for abdominal distention, abdominal pain and constipation.  Endocrine: Negative for polydipsia.  Genitourinary:  Negative for dysuria and frequency.  Musculoskeletal:  Positive for back pain.  Negative for arthralgias.  Skin:  Negative for rash.  Neurological:  Negative for tremors, light-headedness and numbness.  Hematological:  Does not bruise/bleed easily.  Psychiatric/Behavioral:  Negative for agitation and behavioral problems.     Objective:  BP 119/72   Pulse 68   Temp 98.2 F (36.8 C) (Oral)   Ht 5\' 2"  (1.575 m)   Wt 141 lb 3.2 oz (64 kg)   LMP 11/12/2013   SpO2 100%   BMI 25.83 kg/m      08/25/2022    9:19 AM 03/18/2022   10:17 AM 12/15/2021   11:17 AM  BP/Weight  Systolic BP 119 115 129  Diastolic BP 72 69 73  Wt. (Lbs) 141.2 146.2 145  BMI 25.83 kg/m2 26.74 kg/m2 26.52 kg/m2      Physical Exam Constitutional:      Appearance: She is well-developed.  Cardiovascular:     Rate and Rhythm: Normal rate.     Heart sounds: Normal heart sounds. No murmur heard. Pulmonary:     Effort: Pulmonary effort is normal.     Breath sounds: Normal breath sounds. No wheezing or rales.  Chest:     Chest wall: No tenderness.  Abdominal:     General: Bowel sounds are normal. There is no distension.     Palpations: Abdomen is soft. There is no mass.     Tenderness: There is no abdominal tenderness.  Musculoskeletal:     Right lower leg: No edema.     Left lower leg: No edema.     Comments: Negative straight leg raise bilaterally  Neurological:     Mental Status: She is alert and oriented to person, place, and time.  Psychiatric:        Mood and Affect: Mood normal.        Latest Ref Rng & Units 03/18/2022   11:03 AM 08/16/2021    1:06 AM 08/15/2021    3:38 AM  CMP  Glucose 70 - 99 mg/dL 161  99  86   BUN 6 - 24 mg/dL 11  5  10    Creatinine 0.57 - 1.00 mg/dL 0.96  0.45  4.09   Sodium 134 - 144 mmol/L 142  142  141   Potassium 3.5 - 5.2 mmol/L 4.6  3.7  3.5   Chloride 96 - 106 mmol/L 104  111  111   CO2 20 - 29 mmol/L 21  22  20    Calcium 8.7 - 10.2 mg/dL 81.1  8.9  8.2   Total Protein 6.0 - 8.5 g/dL 7.1     Total Bilirubin 0.0 - 1.2 mg/dL 0.3     Alkaline  Phos 44 - 121 IU/L 165     AST 0 - 40 IU/L 15     ALT 0 - 32 IU/L 10       Lipid Panel     Component Value Date/Time   CHOL 108 03/18/2022 1103   TRIG 72 03/18/2022 1103   HDL 46 03/18/2022 1103   CHOLHDL 2.9 04/22/2020 1205   CHOLHDL 3.3 04/14/2016 0928   VLDL 45 (H) 09/25/2015 1144   LDLCALC 47 03/18/2022 1103    CBC    Component Value Date/Time   WBC 3.4 (L) 08/16/2021 0106   RBC 3.31 (L) 08/16/2021 0106   HGB 9.6 (L) 08/16/2021 0106   HCT 29.5 (L) 08/16/2021 0106   PLT 165 08/16/2021 0106   MCV 89.1 08/16/2021 0106   MCH 29.0 08/16/2021 0106   MCHC 32.5 08/16/2021 0106   RDW 14.2 08/16/2021 0106   LYMPHSABS 0.9 08/16/2021 0106   MONOABS 0.3 08/16/2021 0106   EOSABS 0.0 08/16/2021 0106   BASOSABS 0.0 08/16/2021 0106    Lab Results  Component Value Date   HGBA1C 8.1 (A) 08/25/2022    Assessment & Plan:      Type 2 Diabetes Mellitus: A1c increased from 7.7 to 8.1 despite adherence to maximum doses of Metformin, Glipizide, and Ozempic. Discussed the need for additional medication to control blood glucose levels. -Add Farxiga once daily to current regimen. -Check renal function in two weeks to monitor for potential side  effects of Farxiga. -Counseled on Diabetic diet, my plate method, 161 minutes of moderate intensity exercise/week Blood sugar logs with fasting goals of 80-120 mg/dl, random of less than 096 and in the event of sugars less than 60 mg/dl or greater than 045 mg/dl encouraged to notify the clinic. Advised on the need for annual eye exams, annual foot exams, Pneumonia vaccine.   Lower Back Pain: Reports pain when lying down or sitting for extended periods. No radiation of pain. Possible arthritis. -Recommend Voltaren gel for topical pain relief.  Hyperlipidemia: Controlled -Continue statin -Low-cholesterol diet  Hypertension: Controlled -Continue lisinopril -Counseled on blood pressure goal of less than 130/80, low-sodium, DASH diet, medication  compliance, 150 minutes of moderate intensity exercise per week. Discussed medication compliance, adverse effects. Colon Cancer Screening: Due for screening. -Provide stool test kit for patient to complete at home.  Follow-up in three months to reassess diabetes management and overall health.           Meds ordered this encounter  Medications   dapagliflozin propanediol (FARXIGA) 10 MG TABS tablet    Sig: Take 1 tablet (10 mg total) by mouth daily before breakfast.    Dispense:  30 tablet    Refill:  6   atorvastatin (LIPITOR) 80 MG tablet    Sig: Take 1 tablet (80 mg total) by mouth daily.    Dispense:  30 tablet    Refill:  6   glipiZIDE (GLUCOTROL) 10 MG tablet    Sig: Take 1 tablet (10 mg total) by mouth 2 (two) times daily before a meal.    Dispense:  60 tablet    Refill:  6   lisinopril (ZESTRIL) 2.5 MG tablet    Sig: TAKE 1 TABLET (2.5 MG TOTAL) BY MOUTH DAILY.    Dispense:  30 tablet    Refill:  6   metFORMIN (GLUCOPHAGE) 1000 MG tablet    Sig: Take 1 tablet (1,000 mg total) by mouth 2 (two) times daily with a meal.    Dispense:  60 tablet    Refill:  6    Discontinue Invokana   Semaglutide, 2 MG/DOSE, 8 MG/3ML SOPN    Sig: Inject 2 mg as directed once a week.    Dispense:  3 mL    Refill:  6   diclofenac Sodium (VOLTAREN) 1 % GEL    Sig: Apply 4 g topically 4 (four) times daily.    Dispense:  100 g    Refill:  1    Follow-up: Return in about 3 months (around 11/25/2022) for Chronic medical conditions.       Hoy Register, MD, FAAFP. [facility] [place], [place] [phone]   08/25/2022, 1:25 PM

## 2022-08-25 NOTE — Patient Instructions (Signed)
Dapagliflozin Tablets Qu es este medicamento? La DAPAGLIFLOZINA trata la diabetes tipo 2. Acta ayudando a los riones a Geophysicist/field seismologist (glucosa) de la sangre a travs de la orina y esto disminuye el nivel de Banker. Tambin se puede usar para reducir Nurse, adult de empeoramiento de la enfermedad y [place] causada por enfermedad renal e insuficiencia cardaca. Acta ayudando a los riones a Producer, television/film/video sal (sodio) de la sangre a travs de la orina. Esto disminuye la cantidad de trabajo que tienen que [employer] riones y Insurance underwriter. Con frecuencia, este medicamento se combina con cambios en la dieta y el ejercicio. Este medicamento puede ser utilizado para otros usos; si tiene alguna pregunta consulte con su proveedor de atencin mdica o con su farmacutico. MARCAS COMUNES: Particia Nearing le debo informar a mi profesional de la salud antes de tomar este medicamento? Necesitan saber si usted presenta alguno de los [employer] [facility] o situaciones: Deshidratacin Cetoacidosis diabtica Dieta baja en sal Comer menos debido a una enfermedad, [place], dieta o cualquier otro motivo Consume bebidas alcohlicas con frecuencia Realizacin de cirugas Antecedentes de pancreatitis o problemas del pncreas Antecedentes de infeccin por cndida en el pene o la vagina Infeccin en la vejiga, los riones o las vas urinarias Enfermedad renal Presin arterial baja Est en dilisis Problemas para orinar Diabetes tipo 1 Varn sin Nurse, adult o inusual a la dapagliflozina, a otros medicamentos, alimentos, colorantes o conservantes Si est embarazada o buscando quedar embarazada Si est amamantando a un beb Cmo debo utilizar este medicamento? Tome este medicamento por va oral con agua. Use el medicamento segn las instrucciones en la etiqueta a la misma hora todos [place]. Puede tomarlo con o sin alimentos. Si el Social worker, tmelo con  alimentos. Siga usndolo a menos que su equipo de atencin le indique dejar de Media planner. Su farmacutico le dar una Gua del medicamento especial (MedGuide, nombre en ingls) con cada receta y en cada ocasin que la vuelva a surtir. Asegrese de leer esta informacin cada vez cuidadosamente. Hable con su equipo de atencin sobre el uso de este medicamento en nios. Puede requerir atencin especial. Sobredosis: Pngase en contacto inmediatamente con un centro toxicolgico o una sala de urgencia si usted cree que haya tomado demasiado medicamento. ATENCIN: [employer] es solo para usted. No comparta este medicamento con nadie. Qu sucede si me olvido de una dosis? Si olvida una dosis, adminstrela lo antes posible. Si es casi la hora de la prxima dosis, administre solo esa dosis. No se administre dosis adicionales o dobles. Qu puede interactuar con este medicamento? Litio Sulfonilureas tales como glimepirida, glipizida, gliburida Puede ser que esta lista no menciona todas las posibles interacciones. Informe a su profesional de [employer] de [employer] productos a base de hierbas, medicamentos de [place] o suplementos nutritivos que est tomando. Si usted fuma, consume bebidas alcohlicas o si utiliza drogas ilegales, indqueselo tambin a su profesional de [employer]. Algunas sustancias pueden interactuar con su medicamento. A qu debo estar atento al usar [employer]? Visite a su equipo de atencin para que revise su evolucin peridicamente. Si los sntomas no comienzan a mejorar o si empeoran, informe a su equipo de atencin. Este medicamento puede causar una afeccin grave en la que hay demasiado cido en la sangre. Si tiene nuseas, vmitos, dolor de [address], cansancio inusual o problemas respiratorios, deje de usar [place] y llame a su equipo de atencin de inmediato. Si es  posible, use una tira reactiva de cetonas para determinar si tiene cetonas en la orina. Consulte con  su equipo de atencin si tiene diarrea grave, nuseas y vmitos, o sudoracin intensa. La prdida de demasiado lquido corporal podra hacer que sea peligroso usar [employer]. Se monitorizarn los resultados de una prueba llamada Hemoglobina A1C (o A1C). Es un anlisis de sangre sencillo. Mide su control del nivel de azcar en la sangre durante los ltimos 2 a 3 meses. Se le realizar esta prueba cada 3 a 6 meses. Aprenda a revisar su nivel de azcar en la sangre. Conozca los sntomas del nivel bajo y alto de International aid/development worker en la sangre, y cmo controlarlos. Siempre lleve con usted una fuente rpida de azcar por si tiene sntomas de nivel bajo de azcar [employer]. Algunos ejemplos incluyen caramelos duros de azcar o tabletas de glucosa. Asegrese de que otras personas sepan que usted se puede ahogar si come o bebe cuando presenta sntomas graves de [place] bajo de azcar en la sangre, como convulsiones o prdida del conocimiento. Debe obtener ayuda mdica de inmediato. Informe a su equipo de atencin si tiene [employer] de Banker. Es posible que deba cambiar la dosis de su medicamento. Si est enfermo o hace ms ejercicio que lo habitual, es posible que necesite cambiar la dosis de su medicamento. No saltee comidas. Pregunte a su equipo de atencin si debe evitar el alcohol. Muchos productos de venta libre para la tos y el resfriado contienen azcar o alcohol. Estos pueden afectar los niveles de Banker. Use un brazalete o una cadena de identificacin mdica. Lleve consigo una tarjeta que describa su afeccin. Incluya en la tarjeta una lista de los medicamentos y las dosis que [place]. Qu efectos secundarios puedo tener al [employer] este medicamento? Efectos secundarios que debe informar a su equipo de atencin tan pronto como sea posible: Reacciones alrgicas: erupcin cutnea, comezn/picazn, urticaria, hinchazn de la cara, los labios, la lengua o la garganta Deshidratacin:  aumento de la sed, boca seca, sensacin de desmayo o aturdimiento, dolor de [place], orina amarilla oscura o marrn Cetoacidosis diabtica: aumento de la sed o cantidad de orina, boca seca, fatiga, aliento frutal, problemas para respirar, dolor de estmago, nuseas, vmitos Infeccin genital por cndida: enrojecimiento, hinchazn, dolor o comezn/picazn, secrecin espesa o con grumos Nuevo dolor o sensibilidad, cambio en el color de la piel, llagas o lceras, infeccin en las piernas o los pies Infeccin o enrojecimiento, hinchazn, sensibilidad o dolor en los genitales o en el rea desde los genitales hasta la parte de atrs del recto Infeccin de las vas urinarias: ardor al Geographical information systems officer, orinar muy poca cantidad y en forma frecuente, orina con sangre o turbia, dolor en la parte inferior de la espalda o los costados Puede ser que esta lista no menciona todos los posibles efectos secundarios. Comunquese a su mdico por asesoramiento mdico [employer]. Usted puede informar los efectos secundarios a la FDA por telfono al 1-800-FDA-1088. Dnde debo guardar mi medicina? Mantenga fuera del alcance de nios y Neurosurgeon. Guarde a [employer], entre 20 y 25 grados Celsius (68 y 32 grados Fahrenheit). Deseche todo el medicamento que no haya utilizado despus de la fecha de vencimiento. Para desechar los medicamentos que ya no necesite o que estn vencidos: Lleve el medicamento a un programa de recuperacin de medicamentos. Consulte con su farmacia o con una entidad reguladora para encontrar un lugar donde llevarlo. Si no puede [employer],  consulte la etiqueta o el folleto de informacin para ver si debe desecharlo en la basura o arrojarlo por el sanitario. Si no est seguro, pregunte a su equipo de atencin. Si es seguro colocarlo en la basura, saque el medicamento del recipiente. Mezcle el medicamento con piedras sanitarias para gatos, tierra, posos (residuos) de caf u otro  desperdicio. Coloque la [employer] bolsa o recipiente que quede [place]. Deseche en la basura. ATENCIN: Este folleto es un resumen. Puede ser que no cubra toda la posible informacin. Si usted tiene preguntas acerca de esta medicina, consulte con su mdico, su farmacutico o su profesional de Radiographer, therapeutic.  2024 Elsevier/Gold Standard (2021-12-17 00:00:00)

## 2022-08-25 NOTE — Progress Notes (Signed)
Back pain

## 2022-08-31 ENCOUNTER — Other Ambulatory Visit: Payer: Self-pay

## 2022-09-07 ENCOUNTER — Other Ambulatory Visit: Payer: Self-pay

## 2022-09-08 ENCOUNTER — Ambulatory Visit: Payer: Self-pay | Attending: Family Medicine

## 2022-09-08 DIAGNOSIS — E1169 Type 2 diabetes mellitus with other specified complication: Secondary | ICD-10-CM

## 2022-09-08 DIAGNOSIS — E782 Mixed hyperlipidemia: Secondary | ICD-10-CM

## 2022-09-21 ENCOUNTER — Other Ambulatory Visit: Payer: Self-pay

## 2022-10-01 ENCOUNTER — Other Ambulatory Visit (HOSPITAL_COMMUNITY): Payer: Self-pay

## 2022-10-01 ENCOUNTER — Other Ambulatory Visit: Payer: Self-pay

## 2022-10-05 ENCOUNTER — Other Ambulatory Visit: Payer: Self-pay

## 2022-11-09 ENCOUNTER — Other Ambulatory Visit: Payer: Self-pay

## 2022-11-25 ENCOUNTER — Other Ambulatory Visit: Payer: Self-pay

## 2022-11-25 ENCOUNTER — Ambulatory Visit: Payer: Self-pay | Attending: Family Medicine | Admitting: Family Medicine

## 2022-11-25 ENCOUNTER — Encounter: Payer: Self-pay | Admitting: Family Medicine

## 2022-11-25 VITALS — BP 127/76 | HR 75 | Ht 62.0 in | Wt 148.6 lb

## 2022-11-25 DIAGNOSIS — E782 Mixed hyperlipidemia: Secondary | ICD-10-CM

## 2022-11-25 DIAGNOSIS — Z7984 Long term (current) use of oral hypoglycemic drugs: Secondary | ICD-10-CM

## 2022-11-25 DIAGNOSIS — E1169 Type 2 diabetes mellitus with other specified complication: Secondary | ICD-10-CM

## 2022-11-25 DIAGNOSIS — I10 Essential (primary) hypertension: Secondary | ICD-10-CM

## 2022-11-25 LAB — POCT GLYCOSYLATED HEMOGLOBIN (HGB A1C): HbA1c, POC (controlled diabetic range): 8.2 % — AB (ref 0.0–7.0)

## 2022-11-25 MED ORDER — PIOGLITAZONE HCL 15 MG PO TABS
15.0000 mg | ORAL_TABLET | Freq: Every day | ORAL | 6 refills | Status: DC
  Filled 2022-11-25: qty 30, 30d supply, fill #0
  Filled 2022-12-14 – 2022-12-15 (×2): qty 30, 30d supply, fill #1
  Filled 2023-02-01 – 2023-02-02 (×2): qty 30, 30d supply, fill #2

## 2022-11-25 MED ORDER — MISC. DEVICES MISC: 0 refills | Status: AC

## 2022-11-25 NOTE — Progress Notes (Signed)
Subjective:  Patient ID: Margaret Pace, female    DOB: 1965/05/27  Age: 57 y.o. MRN: [id number]  CC: Medical Management of Chronic Issues   HPI Margaret Pace is a 57 y.o. year old female with a history of  type 2 diabetes mellitus (A1c 8.2), hypertension, hyperlipidemia.   Interval History: Discussed the use of AI scribe software for clinical note transcription with the patient, who gave verbal consent to proceed.  The patient, with a history of diabetes, presents for a routine follow-up. She has been checking her blood sugars at home, but did not check it this morning. Her most recent A1c was 8.2, slightly increased from the previous value of 8.1. She is currently on Ozempic, metformin, and glipizide for diabetes management. She was previously prescribed Farxiga, but it is unclear if she ever started this medication. She is also physically active, participating in [employer] classes.  She is on a statin and also remains on lisinopril.       Past Medical History:  Diagnosis Date   Anemia    Pt reported blood transfusion after left leg fracture.   Carpal tunnel syndrome, bilateral    Diabetes mellitus without complication [facility])     Past Surgical History:  Procedure Laterality Date   APPLICATION OF WOUND VAC Left 05/14/2014   Procedure: APPLICATION OF WOUND VAC;  Surgeon: Myrene Galas, MD;  Location: [facility] OR;  Service: Orthopedics;  Laterality: Left;   FASCIOTOMY Left 05/14/2014   Procedure: FOUR COMPARTMENT FASCIOTOMY;  Surgeon: Myrene Galas, MD;  Location: [facility] OR;  Service: Orthopedics;  Laterality: Left;   FRACTURE SURGERY     HARDWARE REMOVAL Left 10/19/2014   Procedure: HARDWARE REMOVAL LEFT TIBIA AND ANKLE;  Surgeon: Myrene Galas, MD;  Location[facility] OR;  Service: Orthopedics;  Laterality: Left;   SECONDARY CLOSURE OF WOUND Left 05/17/2014   Procedure: WOUND CLOSURE LEFT LEG ;  Surgeon: Myrene Galas, MD;  Location: [facility] OR;  Service: Orthopedics;  Laterality: Left;   TIBIA  IM NAIL INSERTION Left 05/14/2014   Procedure: INTRAMEDULLARY (IM) NAIL TIBIAL;  Surgeon: Myrene Galas, MD;  Location: [facility] OR;  Service: Orthopedics;  Laterality: Left;    Family History  Problem Relation Age of Onset   Diabetes Mother    Diabetes Sister    Diabetes Sister    Diabetes Sister    Diabetes Brother    Diabetes Brother    Breast cancer Neg Hx     Social History   Socioeconomic History   Marital status: Legally Separated    Spouse name: Not on file   Number of children: Not on file   Years of education: Not on file   Highest education level: Not on file  Occupational History   Not on file  Tobacco Use   Smoking status: Never   Smokeless tobacco: Never  Vaping Use   Vaping status: Never Used  Substance and Sexual Activity   Alcohol use: No    Alcohol/week: 0.0 standard drinks of alcohol   Drug use: No   Sexual activity: Not Currently  Other Topics Concern   Not on file  Social History Narrative   Not on file   Social Determinants of Health   Financial Resource Strain: Patient Declined (11/25/2022)   Overall Financial Resource Strain (CARDIA)    Difficulty of Paying Living Expenses: Patient declined  Food Insecurity: Patient Declined (11/25/2022)   Hunger Vital Sign    Worried About Running Out of Food in the Last Year: Patient  declined    Barista in the Last Year: Patient declined  Transportation Needs: No Transportation Needs (11/25/2022)   PRAPARE - Administrator, Civil Service (Medical): No    Lack of Transportation (Non-Medical): No  Physical Activity: Patient Declined (11/25/2022)   Exercise Vital Sign    Days of Exercise per Week: Patient declined    Minutes of Exercise per Session: Patient declined  Stress: Patient Declined (11/25/2022)   [employer] of Occupational Health - Occupational Stress Questionnaire    Feeling of Stress : Patient declined  Social Connections: Patient Declined (11/25/2022)   Social  Connection and Isolation Panel [NHANES]    Frequency of Communication with Friends and Family: Patient declined    Frequency of Social Gatherings with Friends and Family: Patient declined    Attends Religious Services: Patient declined    Database administrator or Organizations: Patient declined    Attends Banker Meetings: Patient declined    Marital Status: Patient declined    No Known Allergies  Outpatient Medications Prior to Visit  Medication Sig Dispense Refill   atorvastatin (LIPITOR) 80 MG tablet Take 1 tablet (80 mg total) by mouth daily. 30 tablet 6   Blood Glucose Monitoring Suppl (TRUE METRIX METER) DEVI 1 each by Does not apply route 3 (three) times daily before meals. 1 Device 0   diclofenac Sodium (VOLTAREN) 1 % GEL Apply 4 g topically 4 (four) times daily. 100 g 1   glipiZIDE (GLUCOTROL) 10 MG tablet Take 1 tablet (10 mg total) by mouth 2 (two) times daily before a meal. 60 tablet 6   glucose blood test strip USE AS DIRECTED THREE TIMES DAILY 100 each 12   Insulin Pen Needle 31G X 5 MM MISC 1 each by Does not apply route at bedtime. 100 each 1   lisinopril (ZESTRIL) 2.5 MG tablet TAKE 1 TABLET (2.5 MG TOTAL) BY MOUTH DAILY. 30 tablet 6   loratadine (CLARITIN) 10 MG tablet Take 1 tablet (10 mg total) by mouth daily. 30 tablet 0   metFORMIN (GLUCOPHAGE) 1000 MG tablet Take 1 tablet (1,000 mg total) by mouth 2 (two) times daily with a meal. 60 tablet 6   naproxen (NAPROSYN) 500 MG tablet Take 1 tablet (500 mg total) by mouth 2 (two) times daily with a meal. 14 tablet 0   ondansetron (ZOFRAN) 4 MG tablet Take 1 tablet (4 mg total) by mouth every 6 (six) hours. 15 tablet 0   Semaglutide, 2 MG/DOSE, 8 MG/3ML SOPN Inject 2 mg as directed once a week. 3 mL 6   TRUEPLUS LANCETS 28G MISC 1 each by Does not apply route 3 (three) times daily before meals. 100 each 12   dapagliflozin propanediol (FARXIGA) 10 MG TABS tablet Take 1 tablet (10 mg total) by mouth daily before  breakfast. 30 tablet 6   Misc. Devices MISC Patient with Diabetes needing annual eye exam 1 each 0   No facility-administered medications prior to visit.     ROS Review of Systems  Constitutional:  Negative for activity change and appetite change.  HENT:  Negative for sinus pressure and sore throat.   Respiratory:  Negative for chest tightness, shortness of breath and wheezing.   Cardiovascular:  Negative for chest pain and palpitations.  Gastrointestinal:  Negative for abdominal distention, abdominal pain and constipation.  Genitourinary: Negative.   Musculoskeletal: Negative.   Psychiatric/Behavioral:  Negative for behavioral problems and dysphoric mood.     Objective:  BP 127/76   Pulse 75   Ht 5\' 2"  (1.575 m)   Wt 148 lb 9.6 oz (67.4 kg)   LMP 11/12/2013   SpO2 99%   BMI 27.18 kg/m      11/25/2022   10:02 AM 11/25/2022    9:09 AM 08/25/2022    9:19 AM  BP/Weight  Systolic BP 127 147 119  Diastolic BP 76 75 72  Wt. (Lbs)  148.6 141.2  BMI  27.18 kg/m2 25.83 kg/m2      Physical Exam Constitutional:      Appearance: She is well-developed.  Cardiovascular:     Rate and Rhythm: Normal rate.     Heart sounds: Normal heart sounds. No murmur heard. Pulmonary:     Effort: Pulmonary effort is normal.     Breath sounds: Normal breath sounds. No wheezing or rales.  Chest:     Chest wall: No tenderness.  Abdominal:     General: Bowel sounds are normal. There is no distension.     Palpations: Abdomen is soft. There is no mass.     Tenderness: There is no abdominal tenderness.  Musculoskeletal:        General: Normal range of motion.     Right lower leg: No edema.     Left lower leg: No edema.  Neurological:     Mental Status: She is alert and oriented to person, place, and time.  Psychiatric:        Mood and Affect: Mood normal.        Latest Ref Rng & Units 09/08/2022    9:50 AM 03/18/2022   11:03 AM 08/16/2021    1:06 AM  CMP  Glucose 70 - 99 mg/dL 76  147   99   BUN 6 - 24 mg/dL 19  11  5    Creatinine 0.57 - 1.00 mg/dL 8.29  5.62  1.30   Sodium 134 - 144 mmol/L 141  142  142   Potassium 3.5 - 5.2 mmol/L 4.7  4.6  3.7   Chloride 96 - 106 mmol/L 106  104  111   CO2 20 - 29 mmol/L 22  21  22    Calcium 8.7 - 10.2 mg/dL 9.4  86.5  8.9   Total Protein 6.0 - 8.5 g/dL 6.8  7.1    Total Bilirubin 0.0 - 1.2 mg/dL 0.3  0.3    Alkaline Phos 44 - 121 IU/L 127  165    AST 0 - 40 IU/L 19  15    ALT 0 - 32 IU/L 11  10      Lipid Panel     Component Value Date/Time   CHOL 88 (L) 09/08/2022 0950   TRIG 104 09/08/2022 0950   HDL 39 (L) 09/08/2022 0950   CHOLHDL 2.9 04/22/2020 1205   CHOLHDL 3.3 04/14/2016 0928   VLDL 45 (H) 09/25/2015 1144   LDLCALC 29 09/08/2022 0950    CBC    Component Value Date/Time   WBC 3.4 (L) 08/16/2021 0106   RBC 3.31 (L) 08/16/2021 0106   HGB 9.6 (L) 08/16/2021 0106   HCT 29.5 (L) 08/16/2021 0106   PLT 165 08/16/2021 0106   MCV 89.1 08/16/2021 0106   MCH 29.0 08/16/2021 0106   MCHC 32.5 08/16/2021 0106   RDW 14.2 08/16/2021 0106   LYMPHSABS 0.9 08/16/2021 0106   MONOABS 0.3 08/16/2021 0106   EOSABS 0.0 08/16/2021 0106   BASOSABS 0.0 08/16/2021 0106    Lab Results  Component  Value Date   HGBA1C 8.2 (A) 11/25/2022    Assessment & Plan:      Type 2 Diabetes Mellitus A1c 8.2, slightly increased from 8.1. Patient is currently on Ozempic, Metformin, and Glipizide. Marcelline Deist was not taken for unknown reasons. Patient is also engaging in regular physical activity (Zumba). -Add Actos 15mg  to current regimen; discontinue Farxiga from med list as she never filled this prescription -Counseled on Diabetic diet, my plate method, 098 minutes of moderate intensity exercise/week Blood sugar logs with fasting goals of 80-120 mg/dl, random of less than 119 and in the event of sugars less than 60 mg/dl or greater than 147 mg/dl encouraged to notify the clinic. Advised on the need for annual eye exams, annual foot exams,  Pneumonia vaccine. -Check A1c in 3 months.  Hypertension Initial blood pressure was high, but repeat measurement was 127/76. -Continue current antihypertensive medications. -Counseled on blood pressure goal of less than 130/80, low-sodium, DASH diet, medication compliance, 150 minutes of moderate intensity exercise per week. Discussed medication compliance, adverse effects.  Hyperlipidemia -Doing well on statin -Low-cholesterol diet  General Health Maintenance -Administer influenza vaccine today -advised to stop by the pharmacy to receive flu shot -Provide referral for eye examination.          Meds ordered this encounter  Medications   pioglitazone (ACTOS) 15 MG tablet    Sig: Take 1 tablet (15 mg total) by mouth daily.    Dispense:  30 tablet    Refill:  6    Discontinue Farxiga   Misc. Devices MISC    Sig: Patient with Diabetes needing annual eye exam    Dispense:  1 each    Refill:  0    Follow-up: Return in about 3 months (around 02/25/2023) for Chronic medical conditions.       Hoy Register, MD, FAAFP. [facility] [place], [place] [phone]   11/25/2022, 10:17 AM

## 2022-11-25 NOTE — Patient Instructions (Signed)
Pioglitazone Tablets Qu es este medicamento? La PIOGLITAZONA trata la diabetes tipo 2. Ayuda al cuerpo a usar la Comptroller eficiente y esto disminuye el nivel de azcar en la sangre (glucosa). Con frecuencia, este medicamento se combina con cambios en la dieta y el ejercicio. Este medicamento puede ser utilizado para otros usos; si tiene alguna pregunta consulte con su proveedor de atencin mdica o con su farmacutico. MARCAS COMUNES: Actos Qu le debo informar a mi profesional de la salud antes de tomar este medicamento? Necesitan saber si usted presenta alguno de los siguientes problemas o situaciones: Database administrator de vejiga Cetoacidosis diabtica Una enfermedad ocular llamada edema macular Enfermedad cardiaca Insuficiencia cardiaca Enfermedad renal Enfermedad heptica Sndrome de ovario poliqustico Pemenopusica Inflamacin de los brazos, las piernas o los pies Diabetes tipo 1 Una reaccin alrgica o inusual a la pioglitazona, a otros medicamentos, alimentos, colorantes o conservantes Si est embarazada o buscando quedar embarazada Si est amamantando a un beb Cmo debo [employer]? Tome este medicamento por va oral con un vaso de agua. Siga las instrucciones de la etiqueta del [place]. Administre el medicamento aproximadamente a la Manufacturing systems engineer. No lo use con una frecuencia mayor a la indicada. Su farmacutico le dar una Gua del medicamento especial (MedGuide, nombre en ingls) con cada receta y en cada ocasin que la vuelva a surtir. Asegrese de leer esta informacin cada vez cuidadosamente. Hable con su equipo de atencin sobre el uso de este medicamento en nios. Puede requerir atencin especial. Sobredosis: Pngase en contacto inmediatamente con un centro toxicolgico o una sala de urgencia si usted cree que haya tomado demasiado medicamento.<br>ATENCIN: [employer] es solo para usted. No comparta este medicamento con nadie. Qu sucede si  me olvido de una dosis? Si olvida una dosis, tmela lo antes posible. Si es casi la hora de la prxima dosis, tome slo esa dosis. No tome dosis adicionales o dobles. Qu puede interactuar con este medicamento? Atorvastatina Pldoras anticonceptivas u otros mtodos anticonceptivos hormonales [place] Itraconazol Ketoconazol Midazolam Nifedipino Otros medicamentos para la diabetes, incluso insulina Topiramato Muchos medicamentos pueden aumentar o disminuir el nivel de Banker. Estos incluyen: Bebidas con alcohol Aspirina y otros medicamentos tipo aspirina Cloranfenicol Cromo Diurticos Hormonas femeninas, tales como estrgenos o progestinas, y pldoras anticonceptivas Medicamentos para el corazn Isoniazida Hormonas masculinas o esteroides anablicos Medicamentos para bajar de peso Medicamentos para alergias, asma, resfriados o tos Medicamentos para problemas mentales Medicamentos llamados inhibidores de la monoamino oxidasa (IMAO), tales como [place], Parnate, Marplan y Eldepryl Garner, medicamentos para Chief Technology Officer y la inflamacin, tales como ibuprofeno o naproxeno Pentamidina Fenitona Probenecid Antibiticos del grupo de las quinolonas, tales como ciprofloxacino, levofloxacino y ofloxacino Algunos suplementos dietticos a base de Insurance claims handler esteroideos, tales como la prednisona o la cortisona Medicamentos para la tiroides Puede ser que esta lista no menciona todas las posibles interacciones. Informe a su profesional de [employer] de [employer] productos a base de hierbas, medicamentos de [place] o suplementos nutritivos que est tomando. Si usted fuma, consume bebidas alcohlicas o si utiliza drogas ilegales, indqueselo tambin a su profesional de [employer]. Algunas sustancias pueden interactuar con su medicamento. A qu debo estar atento al usar P[employer] Corporation? Visite a su equipo de atencin para que revise su evolucin peridicamente. Es  posible que necesite realizarse pruebas peridicamente para asegurarse de que el hgado est funcionando en forma correcta. Se monitorizar una prueba llamada Hemoglobina A1C (A1C). Es un anlisis de sangre sencillo.  Mide su control del nivel de azcar en la sangre durante los ltimos 2 a 3 meses. Se le realizar esta prueba cada 3 a 6 meses. Aprenda a revisar su nivel de azcar en la sangre. Conozca los sntomas del nivel bajo y alto de International aid/development worker en la sangre, y cmo controlarlos. Siempre lleve con usted una fuente rpida de azcar por si tiene sntomas de nivel bajo de azcar [employer]. Algunos ejemplos incluyen caramelos duros de azcar o tabletas de glucosa. Asegrese de que otras personas sepan que usted se puede ahogar si come o bebe cuando presenta sntomas graves de [place] bajo de azcar en la sangre, como convulsiones o prdida del conocimiento. Deben obtener ayuda mdica de inmediato. Informe a su equipo de atencin si tiene [employer] de Banker. Es posible que deba cambiar la dosis de su medicamento. Si est enfermo o hace ms ejercicio que lo habitual, es posible que necesite cambiar la dosis de su medicamento. No saltee comidas. Pregunte a su equipo de atencin si debe evitar el alcohol. Muchos productos de venta libre para la tos y el resfriado contienen azcar o alcohol. Estos pueden afectar los niveles de Banker. Este medicamento puede aumentar su riesgo de tener ciertos problemas cardiacos. Busque ayuda mdica de inmediato si tiene dolor u opresin en el pecho, o dolor que irradia a la [place] o desciende por el Probation officer, y falta de Soil scientist. Estos pueden ser signos de una afeccin mdica grave. Este medicamento puede causar ovulacin en mujeres premenopusicas que no tienen periodos mensuales regulares. Esto puede aumentar sus posibilidades de quedar embarazada. No debe tomar este medicamento si queda embarazada o si cree que puede estar embarazada. Hable con su equipo  de atencin sobre sus opciones de mtodos anticonceptivos mientras [place] este medicamento. Contacte de inmediato a su equipo de atencin si cree que est embarazada. Use un brazalete o una cadena de identificacin mdica, y lleve con usted una tarjeta que describa su enfermedad, detalles de su medicamento y horario de dosis. Qu efectos secundarios puedo tener al [employer] este medicamento? Efectos secundarios que debe informar a su equipo de atencin tan pronto como sea posible: Reacciones alrgicas: erupcin cutnea, comezn/picazn, urticaria, hinchazn de la cara, los labios, la lengua o la garganta Cambios en la visin tales como visin borrosa, ver halos alrededor Assurant, prdida de visin Insuficiencia cardiaca: falta de aire, hinchazn de los tobillos, los pies o las manos, aumento de peso repentino, debilidad o fatiga inusuales Lesin en el hgado: dolor en la regin abdominal superior derecha, prdida de apetito, nuseas, heces de color claro, orina amarilla oscura o marrn, color amarillento de los ojos o la piel, debilidad o fatiga inusuales Orina color rojo o marrn oscuro, dolor o dificultad para Geographical information systems officer, Geographical information systems officer con frecuencia Efectos secundarios que generalmente no requieren Psychologist, prison and probation services (debe informarlos a su equipo de atencin si persisten o si son molestos): Dolor de Set designer Goteo o congestin nasal Puede ser que esta lista no menciona todos los posibles efectos secundarios. Comunquese a su mdico por asesoramiento mdico [employer]lett[employer]ard. Usted puede informar los efectos secundarios a la FDA por telfono al 1-800-FDA-1088. Dnde debo guardar mi medicina? Mantenga fuera del alcance de nios y Neurosurgeon. Guarde a [employer], entre 15 y 30 grados Celsius (59 y 9 grados Fahrenheit). Mantenga el recipiente bien cerrado y protegido de la humedad. Deseche todo el medicamento que no haya utilizado despus de la fecha de  vencimiento. <  b>ATENCIN: Este folleto es un resumen. Puede ser que no cubra toda la posible informacin. Si usted tiene preguntas acerca de esta medicina, consulte con su mdico, su farmacutico o su profesional de Radiographer, therapeutic.</b>  2024 Elsevier/Gold Standard (2020-08-30 00:00:00)

## 2022-12-15 ENCOUNTER — Other Ambulatory Visit (HOSPITAL_BASED_OUTPATIENT_CLINIC_OR_DEPARTMENT_OTHER): Payer: Self-pay

## 2022-12-15 ENCOUNTER — Other Ambulatory Visit: Payer: Self-pay

## 2023-01-22 LAB — AMB RESULTS CONSOLE CBG: Glucose: 136

## 2023-02-02 ENCOUNTER — Other Ambulatory Visit: Payer: Self-pay

## 2023-02-08 ENCOUNTER — Other Ambulatory Visit (HOSPITAL_BASED_OUTPATIENT_CLINIC_OR_DEPARTMENT_OTHER): Payer: Self-pay

## 2023-02-12 ENCOUNTER — Other Ambulatory Visit: Payer: Self-pay

## 2023-03-03 ENCOUNTER — Other Ambulatory Visit: Payer: Self-pay | Admitting: Pharmacist

## 2023-03-03 ENCOUNTER — Ambulatory Visit: Payer: Self-pay | Attending: Family Medicine | Admitting: Family Medicine

## 2023-03-03 ENCOUNTER — Encounter: Payer: Self-pay | Admitting: Family Medicine

## 2023-03-03 ENCOUNTER — Other Ambulatory Visit: Payer: Self-pay

## 2023-03-03 VITALS — BP 120/66 | HR 66 | Ht 62.0 in | Wt 148.2 lb

## 2023-03-03 DIAGNOSIS — Z23 Encounter for immunization: Secondary | ICD-10-CM

## 2023-03-03 DIAGNOSIS — E1169 Type 2 diabetes mellitus with other specified complication: Secondary | ICD-10-CM

## 2023-03-03 DIAGNOSIS — I10 Essential (primary) hypertension: Secondary | ICD-10-CM

## 2023-03-03 DIAGNOSIS — E782 Mixed hyperlipidemia: Secondary | ICD-10-CM

## 2023-03-03 LAB — POCT GLYCOSYLATED HEMOGLOBIN (HGB A1C): HbA1c, POC (controlled diabetic range): 7.3 % — AB (ref 0.0–7.0)

## 2023-03-03 MED ORDER — ATORVASTATIN CALCIUM 80 MG PO TABS
80.0000 mg | ORAL_TABLET | Freq: Every day | ORAL | 6 refills | Status: AC
  Filled 2023-03-03: qty 30, 30d supply, fill #0
  Filled 2023-04-12 (×2): qty 30, 30d supply, fill #1
  Filled 2023-08-26: qty 30, 30d supply, fill #2
  Filled 2023-09-26 – 2023-09-27 (×2): qty 30, 30d supply, fill #3
  Filled 2023-11-14 – 2023-11-15 (×2): qty 30, 30d supply, fill #4
  Filled 2023-12-26: qty 30, 30d supply, fill #5
  Filled 2024-02-18 – 2024-02-21 (×2): qty 30, 30d supply, fill #6

## 2023-03-03 MED ORDER — GLIPIZIDE 10 MG PO TABS
10.0000 mg | ORAL_TABLET | Freq: Two times a day (BID) | ORAL | 6 refills | Status: DC
  Filled 2023-03-03: qty 60, 30d supply, fill #0
  Filled 2023-04-12 (×2): qty 60, 30d supply, fill #1
  Filled 2023-05-17 – 2023-05-18 (×2): qty 60, 30d supply, fill #2
  Filled 2023-06-12 – 2023-06-14 (×2): qty 60, 30d supply, fill #3
  Filled 2023-07-18 – 2023-07-19 (×2): qty 60, 30d supply, fill #4
  Filled 2023-08-26 (×2): qty 60, 30d supply, fill #5

## 2023-03-03 MED ORDER — PIOGLITAZONE HCL 15 MG PO TABS
15.0000 mg | ORAL_TABLET | Freq: Every day | ORAL | 6 refills | Status: DC
  Filled 2023-03-03: qty 30, 30d supply, fill #0
  Filled 2023-04-12 (×2): qty 30, 30d supply, fill #1
  Filled 2023-05-17 – 2023-05-18 (×2): qty 30, 30d supply, fill #2
  Filled 2023-06-12 – 2023-06-14 (×2): qty 30, 30d supply, fill #3
  Filled 2023-07-18 – 2023-07-19 (×2): qty 30, 30d supply, fill #4
  Filled 2023-08-26 (×2): qty 30, 30d supply, fill #5
  Filled 2023-09-26 – 2023-09-27 (×2): qty 30, 30d supply, fill #6

## 2023-03-03 MED ORDER — SEMAGLUTIDE (2 MG/DOSE) 8 MG/3ML ~~LOC~~ SOPN
2.0000 mg | PEN_INJECTOR | SUBCUTANEOUS | 6 refills | Status: DC
  Filled 2023-03-03 – 2023-04-12 (×2): qty 3, 28d supply, fill #0
  Filled 2023-05-17 – 2023-05-18 (×2): qty 3, 28d supply, fill #1
  Filled 2023-06-12 – 2023-06-14 (×2): qty 3, 28d supply, fill #2
  Filled 2023-07-18 – 2023-07-19 (×2): qty 3, 28d supply, fill #3
  Filled 2023-08-26 (×2): qty 3, 28d supply, fill #4
  Filled 2023-09-26 – 2023-09-27 (×2): qty 3, 28d supply, fill #5
  Filled 2023-11-14 – 2023-11-15 (×2): qty 3, 28d supply, fill #6

## 2023-03-03 MED ORDER — METFORMIN HCL 1000 MG PO TABS
1000.0000 mg | ORAL_TABLET | Freq: Two times a day (BID) | ORAL | 6 refills | Status: DC
  Filled 2023-03-03: qty 60, 30d supply, fill #0
  Filled 2023-04-12 (×2): qty 60, 30d supply, fill #1
  Filled 2023-05-17 – 2023-05-18 (×2): qty 60, 30d supply, fill #2
  Filled 2023-06-12 – 2023-06-14 (×2): qty 60, 30d supply, fill #3
  Filled 2023-07-18 – 2023-07-19 (×2): qty 60, 30d supply, fill #4
  Filled 2023-08-26 (×3): qty 60, 30d supply, fill #5
  Filled 2023-09-26 – 2023-09-27 (×2): qty 60, 30d supply, fill #6

## 2023-03-03 MED ORDER — LISINOPRIL 2.5 MG PO TABS
2.5000 mg | ORAL_TABLET | Freq: Every day | ORAL | 6 refills | Status: DC
  Filled 2023-03-03: qty 30, 30d supply, fill #0
  Filled 2023-04-12 (×2): qty 30, 30d supply, fill #1
  Filled 2023-05-17 – 2023-05-18 (×2): qty 30, 30d supply, fill #2
  Filled 2023-06-12 – 2023-06-14 (×2): qty 30, 30d supply, fill #3
  Filled 2023-07-18 – 2023-07-19 (×2): qty 30, 30d supply, fill #4
  Filled 2023-08-26 (×2): qty 30, 30d supply, fill #5
  Filled 2023-09-26 – 2023-09-27 (×2): qty 30, 30d supply, fill #6

## 2023-03-03 MED ORDER — SEMAGLUTIDE (1 MG/DOSE) 4 MG/3ML ~~LOC~~ SOPN
1.0000 mg | PEN_INJECTOR | SUBCUTANEOUS | 1 refills | Status: AC
  Filled 2023-03-03: qty 3, 28d supply, fill #0
  Filled 2023-04-12 (×2): qty 3, 28d supply, fill #1

## 2023-03-03 NOTE — Progress Notes (Signed)
Subjective:  Patient ID: Margaret Pace, female    DOB: 04/25/1965  Age: 58 y.o. MRN: [id number]  CC: Medical Management of Chronic Issues   HPI Ryelyn Hench is a 59 y.o. year old female with a history of type 2 diabetes mellitus (A1c 7.3), hypertension, hyperlipidemia.     Interval History: Discussed the use of AI scribe software for clinical note transcription with the patient, who gave verbal consent to proceed.   The patient, with a history of diabetes, presents requesting a prescription for glasses. She reports that she previously received a prescription for eye drops, but she needs a prescription for glasses to be filled at [facility].  On further questioning she reports she did have an eye exam and was informed she had cataract but needs to get new glasses.  Regarding her diabetes, the patient has shown significant improvement, with her A1c level decreasing from 8.2 to 7.3. This improvement is attributed to the addition of Actos to her treatment regimen at her last office visit.     Regarding her hypertension she remains adherent with her antihypertensive and her blood pressure is controlled. She is also doing well on her statin and her last lipid panel was normal.  Past Medical History:  Diagnosis Date   Anemia    Pt reported blood transfusion after left leg fracture.   Carpal tunnel syndrome, bilateral    Diabetes mellitus without complication [facility])     Past Surgical History:  Procedure Laterality Date   APPLICATION OF WOUND VAC Left 05/14/2014   Procedure: APPLICATION OF WOUND VAC;  Surgeon: Myrene Galas, MD;  Location: [facility] OR;  Service: Orthopedics;  Laterality: Left;   FASCIOTOMY Left 05/14/2014   Procedure: FOUR COMPARTMENT FASCIOTOMY;  Surgeon: Myrene Galas, MD;  Location: [facility] OR;  Service: Orthopedics;  Laterality: Left;   FRACTURE SURGERY     HARDWARE REMOVAL Left 10/19/2014   Procedure: HARDWARE REMOVAL LEFT TIBIA AND ANKLE;  Surgeon: Myrene Galas, MD;   Location: [facility] OR;  Service: Orthopedics;  Laterality: Left;   SECONDARY CLOSURE OF WOUND Left 05/17/2014   Procedure: WOUND CLOSURE LEFT LEG ;  Surgeon: Myrene Galas, MD;  Location: [facility] OR;  Service: Orthopedics;  Laterality: Left;   TIBIA IM NAIL INSERTION Left 05/14/2014   Procedure: INTRAMEDULLARY (IM) NAIL TIBIAL;  Surgeon: Myrene Galas, MD;  Location: [facility] OR;  Service: Orthopedics;  Laterality: Left;    Family History  Problem Relation Age of Onset   Diabetes Mother    Diabetes Sister    Diabetes Sister    Diabetes Sister    Diabetes Brother    Diabetes Brother    Breast cancer Neg Hx     Social History   Socioeconomic History   Marital status: Legally Separated    Spouse name: Not on file   Number of children: Not on file   Years of education: Not on file   Highest education level: Not on file  Occupational History   Not on file  Tobacco Use   Smoking status: Never   Smokeless tobacco: Never  Vaping Use   Vaping status: Never Used  Substance and Sexual Activity   Alcohol use: No    Alcohol/week: 0.0 standard drinks of alcohol   Drug use: No   Sexual activity: Not Currently  Other Topics Concern   Not on file  Social History Narrative   Not on file   Social Drivers of Health   Financial Resource Strain: Patient Declined (11/25/2022)  Overall Financial Resource Strain (CARDIA)    Difficulty of Paying Living Expenses: Patient declined  Food Insecurity: Patient Declined (11/25/2022)   Hunger Vital Sign    Worried About Running Out of Food in the Last Year: Patient declined    Ran Out of Food in the Last Year: Patient declined  Transportation Needs: No Transportation Needs (11/25/2022)   PRAPARE - Administrator, Civil Service (Medical): No    Lack of Transportation (Non-Medical): No  Physical Activity: Patient Declined (11/25/2022)   Exercise Vital Sign    Days of Exercise per Week: Patient declined    Minutes of Exercise per Session: Patient  declined  Stress: Patient Declined (11/25/2022)   [employer] of Occupational Health - Occupational Stress Questionnaire    Feeling of Stress : Patient declined  Social Connections: Patient Declined (11/25/2022)   Social Connection and Isolation Panel [NHANES]    Frequency of Communication with Friends and Family: Patient declined    Frequency of Social Gatherings with Friends and Family: Patient declined    Attends Religious Services: Patient declined    Database administrator or Organizations: Patient declined    Attends Banker Meetings: Patient declined    Marital Status: Patient declined    No Known Allergies  Outpatient Medications Prior to Visit  Medication Sig Dispense Refill   Blood Glucose Monitoring Suppl (TRUE METRIX METER) DEVI 1 each by Does not apply route 3 (three) times daily before meals. 1 Device 0   diclofenac Sodium (VOLTAREN) 1 % GEL Apply 4 g topically 4 (four) times daily. 100 g 1   glucose blood test strip USE AS DIRECTED THREE TIMES DAILY 100 each 12   Insulin Pen Needle 31G X 5 MM MISC 1 each by Does not apply route at bedtime. 100 each 1   loratadine (CLARITIN) 10 MG tablet Take 1 tablet (10 mg total) by mouth daily. 30 tablet 0   Misc. Devices MISC Patient with Diabetes needing annual eye exam 1 each 0   naproxen (NAPROSYN) 500 MG tablet Take 1 tablet (500 mg total) by mouth 2 (two) times daily with a meal. 14 tablet 0   ondansetron (ZOFRAN) 4 MG tablet Take 1 tablet (4 mg total) by mouth every 6 (six) hours. 15 tablet 0   TRUEPLUS LANCETS 28G MISC 1 each by Does not apply route 3 (three) times daily before meals. 100 each 12   atorvastatin (LIPITOR) 80 MG tablet Take 1 tablet (80 mg total) by mouth daily. 30 tablet 6   glipiZIDE (GLUCOTROL) 10 MG tablet Take 1 tablet (10 mg total) by mouth 2 (two) times daily before a meal. 60 tablet 6   lisinopril (ZESTRIL) 2.5 MG tablet TAKE 1 TABLET (2.5 MG TOTAL) BY MOUTH DAILY. 30 tablet 6    metFORMIN (GLUCOPHAGE) 1000 MG tablet Take 1 tablet (1,000 mg total) by mouth 2 (two) times daily with a meal. 60 tablet 6   pioglitazone (ACTOS) 15 MG tablet Take 1 tablet (15 mg total) by mouth daily. 30 tablet 6   Semaglutide, 2 MG/DOSE, 8 MG/3ML SOPN Inject 2 mg as directed once a week. 3 mL 6   No facility-administered medications prior to visit.     ROS Review of Systems  Constitutional:  Negative for activity change and appetite change.  HENT:  Negative for sinus pressure and sore throat.   Respiratory:  Negative for chest tightness, shortness of breath and wheezing.   Cardiovascular:  Negative for chest pain  and palpitations.  Gastrointestinal:  Negative for abdominal distention, abdominal pain and constipation.  Genitourinary: Negative.   Musculoskeletal: Negative.   Psychiatric/Behavioral:  Negative for behavioral problems and dysphoric mood.     Objective:  BP 120/66   Pulse 66   Ht 5\' 2"  (1.575 m)   Wt 148 lb 3.2 oz (67.2 kg)   LMP 11/12/2013   SpO2 100%   BMI 27.11 kg/m      03/03/2023    8:52 AM 01/22/2023    8:37 PM 11/25/2022   10:02 AM  BP/Weight  Systolic BP 120 144 127  Diastolic BP 66 82 76  Wt. (Lbs) 148.2    BMI 27.11 kg/m2        Physical Exam Constitutional:      Appearance: She is well-developed.  Cardiovascular:     Rate and Rhythm: Normal rate.     Heart sounds: Normal heart sounds. No murmur heard. Pulmonary:     Effort: Pulmonary effort is normal.     Breath sounds: Normal breath sounds. No wheezing or rales.  Chest:     Chest wall: No tenderness.  Abdominal:     General: Bowel sounds are normal. There is no distension.     Palpations: Abdomen is soft. There is no mass.     Tenderness: There is no abdominal tenderness.  Musculoskeletal:        General: Normal range of motion.     Right lower leg: No edema.     Left lower leg: No edema.  Neurological:     Mental Status: She is alert and oriented to person, place, and time.   Psychiatric:        Mood and Affect: Mood normal.        Latest Ref Rng & Units 09/08/2022    9:50 AM 03/18/2022   11:03 AM 08/16/2021    1:06 AM  CMP  Glucose 70 - 99 mg/dL 76  102  99   BUN 6 - 24 mg/dL 19  11  5    Creatinine 0.57 - 1.00 mg/dL 7.25  3.66  4.40   Sodium 134 - 144 mmol/L 141  142  142   Potassium 3.5 - 5.2 mmol/L 4.7  4.6  3.7   Chloride 96 - 106 mmol/L 106  104  111   CO2 20 - 29 mmol/L 22  21  22    Calcium 8.7 - 10.2 mg/dL 9.4  34.7  8.9   Total Protein 6.0 - 8.5 g/dL 6.8  7.1    Total Bilirubin 0.0 - 1.2 mg/dL 0.3  0.3    Alkaline Phos 44 - 121 IU/L 127  165    AST 0 - 40 IU/L 19  15    ALT 0 - 32 IU/L 11  10      Lipid Panel     Component Value Date/Time   CHOL 88 (L) 09/08/2022 0950   TRIG 104 09/08/2022 0950   HDL 39 (L) 09/08/2022 0950   CHOLHDL 2.9 04/22/2020 1205   CHOLHDL 3.3 04/14/2016 0928   VLDL 45 (H) 09/25/2015 1144   LDLCALC 29 09/08/2022 0950    CBC    Component Value Date/Time   WBC 3.4 (L) 08/16/2021 0106   RBC 3.31 (L) 08/16/2021 0106   HGB 9.6 (L) 08/16/2021 0106   HCT 29.5 (L) 08/16/2021 0106   PLT 165 08/16/2021 0106   MCV 89.1 08/16/2021 0106   MCH 29.0 08/16/2021 0106   MCHC 32.5 08/16/2021 0106  RDW 14.2 08/16/2021 0106   LYMPHSABS 0.9 08/16/2021 0106   MONOABS 0.3 08/16/2021 0106   EOSABS 0.0 08/16/2021 0106   BASOSABS 0.0 08/16/2021 0106    Lab Results  Component Value Date   HGBA1C 7.3 (A) 03/03/2023    Assessment & Plan:      Type II Diabetes Mellitus Significant improvement noted with A1c down to 7.3 from 8.2. Actos added to regimen appears to be controlling blood sugar effectively. -Continue current management. -Order labs to monitor cholesterol, kidney function, and anemia. -Completed eye exam but she is unsure of the provider who completed this.  She needs glasses we have explained to her that she will need to obtain this at an optic store  Immunizations Due for influenza and pneumococcal  vaccines. -Administer influenza -She needs a pneumococcal vaccine however the pharmacy states this will be cash price as there is no patient assistance for this vaccine  Hypertension -Controlled -Continue current medications -Counseled on blood pressure goal of less than 130/80, low-sodium, DASH diet, medication compliance, 150 minutes of moderate intensity exercise per week. Discussed medication compliance, adverse effects.   Hyperlipidemia -Controlled -Continue statin -Low-cholesterol diet General Health Maintenance -Order blood work today to check cholesterol, kidney function, and anemia.          Meds ordered this encounter  Medications   atorvastatin (LIPITOR) 80 MG tablet    Sig: Take 1 tablet (80 mg total) by mouth daily.    Dispense:  30 tablet    Refill:  6   glipiZIDE (GLUCOTROL) 10 MG tablet    Sig: Take 1 tablet (10 mg total) by mouth 2 (two) times daily before a meal.    Dispense:  60 tablet    Refill:  6   lisinopril (ZESTRIL) 2.5 MG tablet    Sig: TAKE 1 TABLET (2.5 MG TOTAL) BY MOUTH DAILY.    Dispense:  30 tablet    Refill:  6   metFORMIN (GLUCOPHAGE) 1000 MG tablet    Sig: Take 1 tablet (1,000 mg total) by mouth 2 (two) times daily with a meal.    Dispense:  60 tablet    Refill:  6   pioglitazone (ACTOS) 15 MG tablet    Sig: Take 1 tablet (15 mg total) by mouth daily.    Dispense:  30 tablet    Refill:  6    Discontinue Farxiga   Semaglutide, 2 MG/DOSE, 8 MG/3ML SOPN    Sig: Inject 2 mg as directed once a week.    Dispense:  3 mL    Refill:  6    Follow-up: Return in about 6 months (around 08/31/2023) for Chronic medical conditions.       Hoy Register, MD, FAAFP. [facility] [place], [place] [phone]   03/03/2023, 9:17 AM

## 2023-03-03 NOTE — Patient Instructions (Signed)
 Hacer ejercicio para mantenerse sano Exercising to Stay Healthy Para estar sano y [place] as, es recomendable hacer ejercicio de intensidad moderada y de intensidad vigorosa. Puede saber si est haciendo ejercicio de intensidad moderada si su corazn comienza a latir ms rpido y su respiracin se vuelve ms rpida, pero an Therapist, occupational. Puede saber que est haciendo ejercicio de intensidad vigorosa si respira con mucha ms dificultad y rapidez, y no puede Pharmacologist una conversacin. Cmo puede beneficiarme el ejercicio? Hacer actividad fsica con regularidad es muy importante. Tiene muchos otros beneficios, como por ejemplo: Mejora el estado fsico general, la flexibilidad y la resistencia. Aumenta la densidad sea. Ayuda a Art gallery manager. Disminuye la Art gallery manager. Aumenta la fuerza y la resistencia muscular. Reduce el estrs y la tensin, la ansiedad, la depresin o la ira. Mejora el estado de salud general. Qu pautas debo seguir mientras hago ejercicio? Hable con el mdico antes de comenzar un programa nuevo de Tornillo fsica. No haga ejercicio en exceso que pudiera hacer que se lastime, se sienta mareado o tenga dificultad para respirar. Use ropa cmoda y calzado con buen soporte. Beba gran cantidad de agua mientras hace ejercicio para evitar la deshidratacin o los golpes de Airline pilot. Haga ejercicio hasta que se aceleren su respiracin y sus latidos cardacos (intensidad [place]). Con qu frecuencia debera hacer ejercicio? Elija una actividad que disfrute y establezca objetivos realistas. El mdico puede ayudarlo a Event organiser un plan de actividades, diseado de [place] individual y que funcione mejor para usted. Haga actividad fsica habitualmente como se lo haya indicado el mdico. Esto puede incluir: Hacer ejercicios de fortalecimiento muscular dos veces a la semana, como: Levantamiento de pesas. Uso de bandas elsticas de resistencia. Flexiones de  [employer]. Abdominales. Yoga. Realizar ejercicio de [place] cierta intensidad durante una cantidad determinada de [place]. Elija entre estas opciones: Un total de 150 minutos de ejercicio de intensidad moderada cada semana. Un total de 75 minutos de ejercicio de intensidad vigorosa cada semana. Burlene Arnt de ejercicio de intensidad moderada y vigorosa cada semana. Los nios, las mujeres [place], las personas que no han hecho actividad fsica con regularidad, las personas que tienen sobrepeso y los adultos mayores tal vez tengan que consultar a un mdico sobre qu actividades son seguras para Education officer, environmental. Si tiene Dispensing optician, asegrese de Science writer al mdico antes de comenzar un programa de ejercicios nuevo. Cules son algunas ideas para hacer ejercicio? Algunas ideas de ejercicio de intensidad moderada incluyen: Caminar 1 milla (1.6 km) en aproximadamente 15 minutos. Andar en bicicleta. Practicar senderismo. Jugar al golf. Bailar. Gimnasia acutica. Algunas ideas de ejercicio de intensidad vigorosa incluyen: Caminar 4.5 millas (7.2 km) o ms en aproximadamente una hora. Trotar o correr 5 millas (8 km) en aproximadamente una hora. Andar en bicicleta 10 millas (16.1 km) o ms en aproximadamente una hora. Practicar natacin. Practicar patinaje de ruedas o en lnea. Hacer esqu de fondo. Hacer deportes competitivos vigorosos, como ftbol americano, bsquet y ftbol. Saltar la cuerda. Tomar clases de baile [place] son algunas actividades diarias que pueden ayudarme a hacer ejercicio? Trabajar en el jardn, como: Empujar una cortadora de csped. Juntar y embolsar hojas. Lavar el automvil. Empujar un cochecito. Palear nieve. Cuidar el jardn. Lavar las ventanas o los pisos. Cmo puedo ser ms activo en mis actividades diarias? Utilice las Microbiologist del ascensor. Vaya a caminar durante su hora de almuerzo. Si conduce, estacione el automvil ms lejos del trabajo o de  la escuela. Si [place] transporte  pblico, bjese una parada antes y camine el resto del camino. Pngase de pie o camine durante todas las llamadas telefnicas que haga mientras est adentro. Levntese, estrese y camine cada 30 minutos a lo largo del Futures trader. Haga ejercicio con Leisure centre manager. El apoyo para continuar haciendo ejercicio lo ayudar a Pharmacologist una rutina de actividad frecuente. Dnde obtener ms informacin Puede obtener ms informacin acerca de cmo hacer actividad fsica para mantenerse sano en: U.S. Department of Health and [employer] (Departamento de Salud y Servicios Humanos de los Estados Unidos): ThisPath.fi Centers for Disease Control and Prevention Insurance claims handler) (Centros para Air traffic controller y la Prevencin de Event organiser): [email or web address].com.br Resumen Hacer actividad fsica con regularidad es muy importante. Mejorar el estado fsico general, la flexibilidad y la resistencia. El ejercicio regular tambin mejorar la salud general. Fawn Kirk a controlar su peso, reducir el estrs y Scientist, clinical (histocompatibility and immunogenetics) la densidad sea. No haga ejercicio en exceso que pudiera hacer que se lastime, se sienta mareado o tenga dificultad para respirar. Hable con el mdico antes de comenzar un programa nuevo de [place] fsica. Esta informacin no tiene Theme park manager el consejo del mdico. Asegrese de hacerle al mdico cualquier pregunta que tenga. Document Revised: 06/06/2020 Document Reviewed: 06/06/2020 Elsevier Patient Education  2024 [employer].

## 2023-03-04 LAB — CMP14+EGFR
ALT: 11 [IU]/L (ref 0–32)
AST: 19 [IU]/L (ref 0–40)
Albumin: 4.3 g/dL (ref 3.8–4.9)
Alkaline Phosphatase: 128 [IU]/L — ABNORMAL HIGH (ref 44–121)
BUN/Creatinine Ratio: 15 (ref 9–23)
BUN: 15 mg/dL (ref 6–24)
Bilirubin Total: 0.3 mg/dL (ref 0.0–1.2)
CO2: 21 mmol/L (ref 20–29)
Calcium: 10 mg/dL (ref 8.7–10.2)
Chloride: 106 mmol/L (ref 96–106)
Creatinine, Ser: 0.99 mg/dL (ref 0.57–1.00)
Globulin, Total: 2.8 g/dL (ref 1.5–4.5)
Glucose: 53 mg/dL — ABNORMAL LOW (ref 70–99)
Potassium: 4.4 mmol/L (ref 3.5–5.2)
Sodium: 144 mmol/L (ref 134–144)
Total Protein: 7.1 g/dL (ref 6.0–8.5)
eGFR: 67 mL/min/{1.73_m2} (ref >59)

## 2023-03-04 LAB — LP+NON-HDL CHOLESTEROL
Cholesterol, Total: 97 mg/dL — ABNORMAL LOW (ref 100–199)
HDL: 43 mg/dL (ref >39)
LDL Chol Calc (NIH): 35 mg/dL (ref 0–99)
Total Non-HDL-Chol (LDL+VLDL): 54 mg/dL (ref 0–129)
Triglycerides: 97 mg/dL (ref 0–149)
VLDL Cholesterol Cal: 19 mg/dL (ref 5–40)

## 2023-03-04 LAB — MICROALBUMIN / CREATININE URINE RATIO
Creatinine, Urine: 111 mg/dL
Microalb/Creat Ratio: <3 mg/g{creat} (ref 0–29)
Microalbumin, Urine: <3 ug/mL

## 2023-03-08 ENCOUNTER — Other Ambulatory Visit: Payer: Self-pay

## 2023-04-07 ENCOUNTER — Other Ambulatory Visit: Payer: Self-pay

## 2023-04-12 ENCOUNTER — Other Ambulatory Visit: Payer: Self-pay

## 2023-04-13 ENCOUNTER — Other Ambulatory Visit: Payer: Self-pay

## 2023-04-17 ENCOUNTER — Encounter (HOSPITAL_COMMUNITY): Payer: Self-pay

## 2023-04-17 ENCOUNTER — Other Ambulatory Visit: Payer: Self-pay

## 2023-04-17 ENCOUNTER — Emergency Department (HOSPITAL_COMMUNITY): Payer: Self-pay

## 2023-04-17 ENCOUNTER — Emergency Department (HOSPITAL_COMMUNITY)
Admission: EM | Admit: 2023-04-17 | Discharge: 2023-04-17 | Disposition: A | Discharge status: Home / Self Care | Payer: Self-pay | Attending: Emergency Medicine | Admitting: Emergency Medicine

## 2023-04-17 DIAGNOSIS — W540XXA Bitten by dog, initial encounter: Secondary | ICD-10-CM | POA: Insufficient documentation

## 2023-04-17 DIAGNOSIS — S0185XA Open bite of other part of head, initial encounter: Secondary | ICD-10-CM

## 2023-04-17 DIAGNOSIS — Y9301 Activity, walking, marching and hiking: Secondary | ICD-10-CM | POA: Insufficient documentation

## 2023-04-17 DIAGNOSIS — S52391B Other fracture of shaft of radius, right arm, initial encounter for open fracture type I or II: Secondary | ICD-10-CM | POA: Insufficient documentation

## 2023-04-17 DIAGNOSIS — Z7984 Long term (current) use of oral hypoglycemic drugs: Secondary | ICD-10-CM | POA: Insufficient documentation

## 2023-04-17 DIAGNOSIS — Z23 Encounter for immunization: Secondary | ICD-10-CM | POA: Insufficient documentation

## 2023-04-17 DIAGNOSIS — Z2914 Encounter for prophylactic rabies immune globin: Secondary | ICD-10-CM | POA: Insufficient documentation

## 2023-04-17 DIAGNOSIS — Z794 Long term (current) use of insulin: Secondary | ICD-10-CM | POA: Insufficient documentation

## 2023-04-17 DIAGNOSIS — E119 Type 2 diabetes mellitus without complications: Secondary | ICD-10-CM | POA: Insufficient documentation

## 2023-04-17 DIAGNOSIS — S01411A Laceration without foreign body of right cheek and temporomandibular area, initial encounter: Secondary | ICD-10-CM | POA: Insufficient documentation

## 2023-04-17 DIAGNOSIS — S51851A Open bite of right forearm, initial encounter: Secondary | ICD-10-CM

## 2023-04-17 MED ORDER — ONDANSETRON HCL 4 MG/2ML IJ SOLN: 4.0000 mg | Freq: Once | INTRAMUSCULAR | Status: AC

## 2023-04-17 MED ORDER — RABIES IMMUNE GLOBULIN 150 UNIT/ML IM INJ
20.0000 [IU]/kg | INJECTION | Freq: Once | INTRAMUSCULAR | Status: AC
  Administered 2023-04-17: 1350 [IU] via INTRAMUSCULAR
  Filled 2023-04-17: qty 10

## 2023-04-17 MED ORDER — OXYCODONE HCL 5 MG PO TABS: 5.0000 mg | ORAL_TABLET | Freq: Four times a day (QID) | ORAL | 0 refills | Status: AC | PRN

## 2023-04-17 MED ORDER — FENTANYL CITRATE PF 50 MCG/ML IJ SOSY
PREFILLED_SYRINGE | INTRAMUSCULAR | Status: AC
  Administered 2023-04-17: 50 ug via INTRAVENOUS
  Filled 2023-04-17: qty 1

## 2023-04-17 MED ORDER — OXYCODONE-ACETAMINOPHEN 5-325 MG PO TABS
1.0000 | ORAL_TABLET | Freq: Once | ORAL | Status: AC
  Administered 2023-04-17: 1 via ORAL
  Filled 2023-04-17: qty 1

## 2023-04-17 MED ORDER — AMOXICILLIN-POT CLAVULANATE 875-125 MG PO TABS: 1.0000 | ORAL_TABLET | Freq: Two times a day (BID) | ORAL | 0 refills | Status: AC

## 2023-04-17 MED ORDER — ONDANSETRON HCL 4 MG/2ML IJ SOLN
INTRAMUSCULAR | Status: AC
  Administered 2023-04-17: 4 mg via INTRAVENOUS
  Filled 2023-04-17: qty 2

## 2023-04-17 MED ORDER — OXYCODONE HCL 5 MG PO TABS
5.0000 mg | ORAL_TABLET | Freq: Four times a day (QID) | ORAL | 0 refills | Status: DC | PRN
  Filled 2023-04-17: qty 9, 3d supply, fill #0

## 2023-04-17 MED ORDER — FENTANYL CITRATE PF 50 MCG/ML IJ SOSY: 50.0000 ug | PREFILLED_SYRINGE | Freq: Once | INTRAMUSCULAR | Status: AC

## 2023-04-17 MED ORDER — AMOXICILLIN-POT CLAVULANATE 875-125 MG PO TABS
1.0000 | ORAL_TABLET | Freq: Two times a day (BID) | ORAL | 0 refills | Status: DC
  Filled 2023-04-17: qty 14, 7d supply, fill #0

## 2023-04-17 MED ORDER — AMOXICILLIN-POT CLAVULANATE 875-125 MG PO TABS
1.0000 | ORAL_TABLET | Freq: Once | ORAL | Status: AC
  Administered 2023-04-17: 1 via ORAL
  Filled 2023-04-17: qty 1

## 2023-04-17 MED ORDER — LIDOCAINE-EPINEPHRINE-TETRACAINE (LET) TOPICAL GEL
3.0000 mL | Freq: Once | TOPICAL | Status: AC
  Administered 2023-04-17: 3 mL via TOPICAL
  Filled 2023-04-17: qty 3

## 2023-04-17 MED ORDER — RABIES VACCINE, PCEC IM SUSR
1.0000 mL | Freq: Once | INTRAMUSCULAR | Status: AC
  Administered 2023-04-17: 1 mL via INTRAMUSCULAR
  Filled 2023-04-17: qty 1

## 2023-04-17 NOTE — ED Provider Notes (Signed)
 [place] EMERGENCY DEPARTMENT AT [facility] Provider Note   CSN: [id number] Arrival date & time: 04/17/23  1338     History  Chief Complaint  Patient presents with   Animal Bite    Margaret Pace is a 58 y.o. female.  Patient is a 58 year old female with a past medical history of diabetes presenting to the emergency department after a dog bite.  Patient states that she was outside when she was attacked by 2 pit bulls and was bit on the right forearm and the right side of her face.  She states that she was not trying to pet or feed these dogs and they came out of nowhere.  She states that she was unsure of the owner.  Per police, the dogs were captured and were both shot and killed.  The patient is unsure of the dog's vaccination status.  She states that her tetanus is up-to-date.  She denies any numbness or weakness.  She denies any jaw pain or loose teeth.  The history is provided by the patient. The history is limited by a language barrier. A language interpreter was used [employer] ID# [phone]).  Animal Bite      Home Medications Prior to Admission medications   Medication Sig Start Date End Date Taking? Authorizing Provider  amoxicillin-clavulanate (AUGMENTIN) 875-125 MG tablet Take 1 tablet by mouth every 12 (twelve) hours. 04/17/23  Yes Theresia Lo, Benetta Spar K, DO  oxyCODONE (ROXICODONE) 5 MG immediate release tablet Take 1 tablet (5 mg total) by mouth every 6 (six) hours as needed. 04/17/23  Yes Theresia Lo, Benetta Spar K, DO  atorvastatin (LIPITOR) 80 MG tablet Take 1 tablet (80 mg total) by mouth daily. 03/03/23   Hoy Register, MD  Blood Glucose Monitoring Suppl (TRUE METRIX METER) DEVI 1 each by Does not apply route 3 (three) times daily before meals. 09/25/15   Hoy Register, MD  diclofenac Sodium (VOLTAREN) 1 % GEL Apply 4 g topically 4 (four) times daily. 08/25/22   Hoy Register, MD  glipiZIDE (GLUCOTROL) 10 MG tablet Take 1 tablet (10 mg total) by mouth  2 (two) times daily before a meal. 03/03/23   Newlin, Enobong, MD  glucose blood test strip USE AS DIRECTED THREE TIMES DAILY 09/22/18   Hoy Register, MD  Insulin Pen Needle 31G X 5 MM MISC 1 each by Does not apply route at bedtime. 08/29/19   Hoy Register, MD  lisinopril (ZESTRIL) 2.5 MG tablet Take 1 tablet (2.5 mg total) by mouth daily. 03/03/23   Hoy Register, MD  loratadine (CLARITIN) 10 MG tablet Take 1 tablet (10 mg total) by mouth daily. 07/22/21   Marcine Matar, MD  metFORMIN (GLUCOPHAGE) 1000 MG tablet Take 1 tablet (1,000 mg total) by mouth 2 (two) times daily with a meal. 03/03/23   Hoy Register, MD  Misc. Devices MISC Patient with Diabetes needing annual eye exam 11/25/22   Hoy Register, MD  naproxen (NAPROSYN) 500 MG tablet Take 1 tablet (500 mg total) by mouth 2 (two) times daily with a meal. 07/29/21   Newlin, Odette Horns, MD  ondansetron (ZOFRAN) 4 MG tablet Take 1 tablet (4 mg total) by mouth every 6 (six) hours. 08/12/21   Prosperi, Christian H, PA-C  pioglitazone (ACTOS) 15 MG tablet Take 1 tablet (15 mg total) by mouth daily. 03/03/23   Hoy Register, MD  Semaglutide, 1 MG/DOSE, 4 MG/3ML SOPN Inject 1 mg as directed once a week. 03/03/23 04/28/23  Hoy Register, MD  Semaglutide, 2  MG/DOSE, 8 MG/3ML SOPN Inject 2 mg as directed once a week. 03/03/23   Hoy Register, MD  TRUEPLUS LANCETS 28G MISC 1 each by Does not apply route 3 (three) times daily before meals. 03/23/17   Hoy Register, MD      Allergies    Patient has no known allergies.    Review of Systems   Review of Systems  Physical Exam Updated Vital Signs BP (!) 143/51   Pulse (!) 59   Temp 97.9 F (36.6 C) (Oral)   Wt 68 kg   LMP 11/12/2013   SpO2 100%   BMI 27.44 kg/m  Physical Exam Vitals and nursing note reviewed.  Constitutional:      General: She is not in acute distress.    Appearance: Normal appearance.     Comments: Uncomfortable appearing  HENT:     Head: Normocephalic.      Comments: Bite wound with superficial laceration of R cheek, no active bleeding    Nose: Nose normal.     Mouth/Throat:     Mouth: Mucous membranes are moist.     Pharynx: Oropharynx is clear.     Comments: No intraoral or through and through lacerations of mouth, no jaw tenderness to palpation Eyes:     Extraocular Movements: Extraocular movements intact.     Conjunctiva/sclera: Conjunctivae normal.     Pupils: Pupils are equal, round, and reactive to light.  Cardiovascular:     Rate and Rhythm: Normal rate and regular rhythm.     Heart sounds: Normal heart sounds.  Pulmonary:     Effort: Pulmonary effort is normal.     Breath sounds: Normal breath sounds.  Abdominal:     General: Abdomen is flat.     Palpations: Abdomen is soft.     Tenderness: There is no abdominal tenderness.  Musculoskeletal:     Cervical back: Normal range of motion and neck supple.     Comments: Significant tenderness to palpation and swelling to R mid forearm and with 2 overlying puncture/bite wounds, small amount of active oozing, no pulsatile bleeding R elbow flexion/extension intact actively R wrist ROM limited 2/2 pain but passive ROM intact, wiggling R fingers  Skin:    General: Skin is warm and dry.  Neurological:     General: No focal deficit present.     Mental Status: She is alert and oriented to person, place, and time.  Psychiatric:        Mood and Affect: Mood normal.        Behavior: Behavior normal.     ED Results / Procedures / Treatments   Labs (all labs ordered are listed, but only abnormal results are displayed) Labs Reviewed - No data to display  EKG None  Radiology DG Forearm Right Result Date: 04/17/2023 CLINICAL DATA:  dog bite, eval for fracture or foreign body EXAM: RIGHT FOREARM - 2 VIEW COMPARISON:  None Available. FINDINGS: There is a rounded lucency within the proximal shaft of the radius which may reflect a puncture fracture. No displaced fractures noted. Degenerative  changes of the carpal bones. Soft tissue edema with soft tissue air. No unexpected radiopaque foreign body. IMPRESSION: 1. There is a lucency within the proximal shaft of the radius which may reflect a puncture fracture. No displaced fractures noted. 2. No unexpected radiopaque foreign body. Electronically Signed   By: Meda Klinefelter M.D.   On: 04/17/2023 14:22    Procedures Procedures    Medications Ordered in ED Medications  rabies immune globulin (HYPERRAB/KEDRAB) injection 1,350 Units (has no administration in time range)  lidocaine-EPINEPHrine-tetracaine (LET) topical gel (has no administration in time range)  oxyCODONE-acetaminophen (PERCOCET/ROXICET) 5-325 MG per tablet 1 tablet (has no administration in time range)  fentaNYL (SUBLIMAZE) injection 50 mcg (50 mcg Intravenous Given 04/17/23 1425)  rabies vaccine (RABAVERT) injection 1 mL (1 mL Intramuscular Given 04/17/23 1423)  amoxicillin-clavulanate (AUGMENTIN) 875-125 MG per tablet 1 tablet (1 tablet Oral Given 04/17/23 1431)  ondansetron (ZOFRAN) injection 4 mg (4 mg Intravenous Given 04/17/23 1425)    ED Course/ Medical Decision Making/ A&P Clinical Course as of 04/17/23 1531  Sat Apr 17, 2023  1431 Possible puncture fracture on R forearm XR. Will consult ortho for possible open fracture. [VK]  1503 I spoke with Dr. Melvyn Novas with hand surgery who recommended bedside irrigation of the wound and splint placement. Can dc home on antibiotics and follow up in the office next week.  [VK]  1529 Patient signed out to Dr. Wilkie Aye pending CT read and splint placement. [VK]    Clinical Course User Index [VK] Rexford Maus, DO                                 Medical Decision Making This patient presents to the ED with chief complaint(s) of dog bite with pertinent past medical history of diabetes which further complicates the presenting complaint. The complaint involves an extensive differential diagnosis and also carries with it a high  risk of complications and morbidity.    The differential diagnosis includes laceration, foreign body, facial fracture forearm fracture  Additional history obtained: Additional history obtained from EMS  Records reviewed Primary Care Documents  ED Course and Reassessment: On patient's arrival she is hemodynamically stable, uncomfortable appearing but in no acute distress.  Patient is neurovascularly intact.  Patient will need forearm x-ray as well as CT max face to evaluate for fractures or foreign body.  She was given pain control.  Reports her tetanus is up-to-date and is not require update today.  Patient would like to undergo rabies vaccination series and will be given antibiotic prophylaxis.  Independent labs interpretation:  N/A  Independent visualization of imaging: - I independently visualized the following imaging with scope of interpretation limited to determining acute life threatening conditions related to emergency care: R Forearm XR, which revealed puncture fracture of radius   Consultation: - Consulted or discussed management/test interpretation w/ external professional: hand surgery   Amount and/or Complexity of Data Reviewed Radiology: ordered.  Risk Prescription drug management.          Final Clinical Impression(s) / ED Diagnoses Final diagnoses:  Dog bite of face, initial encounter  Dog bite of right forearm, initial encounter  Other type I or II open fracture of shaft of right radius, initial encounter    Rx / DC Orders ED Discharge Orders          Ordered    amoxicillin-clavulanate (AUGMENTIN) 875-125 MG tablet  Every 12 hours        04/17/23 1505    oxyCODONE (ROXICODONE) 5 MG immediate release tablet  Every 6 hours PRN        04/17/23 1507              Rexford Maus, DO 04/17/23 1531

## 2023-04-17 NOTE — ED Triage Notes (Signed)
 Pt BIB [facility] d/t dog bite. Per EMS ,pt was walking around in her neighborhood. 2 pit bull dogs came out of nowhere started bit the pt. Pt got bit on her R cheeks and R forearm. Pt had 1 episode of vomiting w EMS.

## 2023-04-17 NOTE — Discharge Instructions (Addendum)
 SEGUIMIENTO DE LA VACUNA ANTIRRBICA  [place] del paciente: Margaret Pace                     [place] original de solicitud:04/17/2023  Lucianne Muss mdico: [id number] Mdico de emergencia: Rexford Maus, DO Diagnstico primario: Exposicin a la rabia       Proveedor de Herbert Spires (PCP): Hoy Register, MD  [place] de telfono del paciente: [phone] (home)    (celular)  Telephone Information:  Mobile [phone]    Aleen Campi) There is no work phone number on file. Especie del animal:     Usted ha sido examinado por el Departamento de Emergencia por Neomia Dear posible exposicin a la rabia. Es importante que regrese para recibir Arts development officer de las dosis de Financial trader.  Llame a la clnica que figura abajo para averiguar los horarios de atencin.   La clnica que administrar sus vacunas contra la rabia:    DA 0:  04/17/2023      DA 3:  04/20/2023       DA 7:  04/24/2023     DA 14:  05/01/2023         La 5ta inyeccin de la vacuna se considera exclusivamente para los pacientes comprometidos inmunitariamente.  DA 28:  05/15/2023      You were seen in the emergency department after your dog bite.  You did have a small puncture fracture in your forearm and we have placed your arm in a splint.  You should keep this on at all times and keep this dry until you follow-up with orthopedics.  You should call them on Monday to schedule follow-up appointment next week.  And given you a course of antibiotics he should complete this as prescribed.  You can take Tylenol and Motrin every 6 hours as needed for pain and I have given you oxycodone for breakthrough pain.  This can make you drowsy so do not drive, work or operate heavy machinery while on this medication.  You can ice your forearm and keep it elevated to help with the swelling.  You should follow the above instructions to complete your rabies vaccination series.  You  should return to the emergency department if you notice pus draining from your wounds, you have fevers despite the antibiotics, significant increased pain or any other new or concerning symptoms.  Lo atendieron en el departamento de emergencias despus de la mordedura de su perro. Tena una pequea fractura punzante en el antebrazo y le hemos colocado una frula en el brazo. Debe mantenerla puesta en todo momento y [place] seca hasta que tenga una cita de seguimiento con el ortopedista. Debe llamarlos el lunes para programar una cita de seguimiento la prxima semana. Y le han dado un tratamiento con antibiticos que debe completar segn lo prescrito. Puede tomar Tylenol y Motrin cada 6 horas segn sea necesario para el dolor y le he dado oxicodona para Chief Technology Officer irruptivo. Esto puede causarle somnolencia, as que no conduzca, trabaje ni opere maquinaria pesada mientras est tomando [employer]. Puede aplicar hielo en el antebrazo y mantenerlo elevado para ayudar con la hinchazn. Debe seguir las instrucciones anteriores para completar su serie de vacunas contra la rabia. Debe regresar al departamento de emergencias si nota que le supura pus de las heridas, tiene fiebre a Scientist, water quality de los antibiticos, un aumento significativo del dolor o cualquier otro sntoma nuevo o preocupante.

## 2023-04-17 NOTE — ED Provider Notes (Signed)
 Patient signed out to me by previous provider. Please refer to their note for full HPI.  Briefly this is a 58 year old female who was attacked by dogs.  Sustained right forearm and right facial injury.  Right forearm diagnosed and treated by previous provider, washed out and splinted per orthopedic/hand recommendations.  Antibiotics ordered.  Patient signed out pending CT of the face.  CT of the face shows no acute fracture.  She does have a small laceration that we will be plan to leave open due to high risk for infection.  Face has been dressed.  Patient will be discharged with outpatient follow-up instructions for orthopedic/hand.  Antibiotics have been ordered.  Patient and family has been updated.  Patient at this time appears safe and stable for discharge and close outpatient follow up. Discharge plan and strict return to ED precautions discussed, patient verbalizes understanding and agreement.   Rozelle Logan, [place] 04/17/23 [id number]

## 2023-04-17 NOTE — Progress Notes (Signed)
 Orthopedic Tech Progress Note Patient Details:  Margaret Pace 1965/07/01 [id number]  Ortho Devices Type of Ortho Device: Sugartong splint, Arm sling Ortho Device/Splint Location: RUE Ortho Device/Splint Interventions: Application   Post Interventions Patient Tolerated: Well  Genelle Bal [name] 04/17/2023, 6:05 PM

## 2023-04-19 ENCOUNTER — Telehealth: Payer: Self-pay | Admitting: *Deleted

## 2023-04-19 ENCOUNTER — Ambulatory Visit: Payer: Self-pay | Admitting: Family Medicine

## 2023-04-19 ENCOUNTER — Other Ambulatory Visit: Payer: Self-pay

## 2023-04-19 NOTE — Telephone Encounter (Signed)
 Noted.

## 2023-04-19 NOTE — Telephone Encounter (Signed)
 PT son called regarding follow-up rabies shots.  RNCM advised to go to [facility] on [address] [facility] [address] [facility].

## 2023-04-19 NOTE — Telephone Encounter (Signed)
 Attempted to contact patient via interrupter services with agent [phone], left vm to call office back.    Copied from CRM [id number]. Topic: Clinical - Medical Advice >> Apr 19, 2023 12:59 PM Haroldine Laws wrote: Reason for CRM: pt's son Gwendalyn Ege called saying his mother was in the ER this weekend for a dog bite.  They are treating her with shots for rabies.  She is supposed to get a shot tomorrow but they do not know where to go.  Please advise  [phone]

## 2023-04-19 NOTE — Telephone Encounter (Signed)
 Patient son advised in separate encounter today to go to [facility] on [address].

## 2023-04-20 ENCOUNTER — Ambulatory Visit (HOSPITAL_COMMUNITY)
Admission: EM | Admit: 2023-04-20 | Discharge: 2023-04-20 | Disposition: A | Discharge status: Home / Self Care | Payer: Self-pay | Attending: Family Medicine | Admitting: Family Medicine

## 2023-04-20 DIAGNOSIS — Z203 Contact with and (suspected) exposure to rabies: Secondary | ICD-10-CM

## 2023-04-20 MED ORDER — RABIES VACCINE, PCEC IM SUSR
INTRAMUSCULAR | Status: AC
  Filled 2023-04-20: qty 1

## 2023-04-20 MED ORDER — RABIES VACCINE, PCEC IM SUSR
1.0000 mL | Freq: Once | INTRAMUSCULAR | Status: AC
  Administered 2023-04-20: 1 mL via INTRAMUSCULAR

## 2023-04-20 NOTE — ED Triage Notes (Signed)
 Pt here for 2nd rabies vaccine. Denies any complications.

## 2023-04-27 ENCOUNTER — Other Ambulatory Visit: Payer: Self-pay

## 2023-04-27 ENCOUNTER — Ambulatory Visit (HOSPITAL_COMMUNITY): Payer: Self-pay

## 2023-04-27 ENCOUNTER — Ambulatory Visit (HOSPITAL_COMMUNITY)
Admission: EM | Admit: 2023-04-27 | Discharge: 2023-04-27 | Disposition: A | Discharge status: Home / Self Care | Payer: Self-pay | Attending: Family Medicine | Admitting: Family Medicine

## 2023-04-27 DIAGNOSIS — Z203 Contact with and (suspected) exposure to rabies: Secondary | ICD-10-CM

## 2023-04-27 MED ORDER — RABIES VACCINE, PCEC IM SUSR
INTRAMUSCULAR | Status: AC
  Filled 2023-04-27: qty 1

## 2023-04-27 MED ORDER — RABIES VACCINE, PCEC IM SUSR
1.0000 mL | Freq: Once | INTRAMUSCULAR | Status: AC
  Administered 2023-04-27: 1 mL via INTRAMUSCULAR

## 2023-04-27 NOTE — ED Triage Notes (Signed)
 Via [employer] video interpreter [phone]:  Pt presents for rabies injection. Per record, pt is off schedule for rabies vaccine. Pt states she was unaware that she was supposed to have another injection 04/24/23. Informed pt she will need to return 05/01/23 for her final injection -- pt verbalized understanding.

## 2023-04-27 NOTE — Discharge Instructions (Signed)
 Regressa 05/01/2023.

## 2023-05-01 ENCOUNTER — Ambulatory Visit (HOSPITAL_COMMUNITY)
Admission: EM | Admit: 2023-05-01 | Discharge: 2023-05-01 | Disposition: A | Discharge status: Home / Self Care | Payer: Self-pay | Attending: Emergency Medicine | Admitting: Emergency Medicine

## 2023-05-01 DIAGNOSIS — Z203 Contact with and (suspected) exposure to rabies: Secondary | ICD-10-CM

## 2023-05-01 MED ORDER — RABIES VACCINE, PCEC IM SUSR
1.0000 mL | Freq: Once | INTRAMUSCULAR | Status: AC
  Administered 2023-05-01: 1 mL via INTRAMUSCULAR

## 2023-05-01 MED ORDER — RABIES VACCINE, PCEC IM SUSR
INTRAMUSCULAR | Status: AC
  Filled 2023-05-01: qty 1

## 2023-05-01 NOTE — ED Triage Notes (Signed)
 Interpreter used for this session.  Patient is here for last rabies injection vaccine.  Given in Rt arm. Tolerated well.  NDC# [phone]

## 2023-05-06 ENCOUNTER — Other Ambulatory Visit: Payer: Self-pay

## 2023-05-18 ENCOUNTER — Other Ambulatory Visit: Payer: Self-pay

## 2023-06-14 ENCOUNTER — Other Ambulatory Visit: Payer: Self-pay

## 2023-07-19 ENCOUNTER — Other Ambulatory Visit: Payer: Self-pay

## 2023-07-26 ENCOUNTER — Other Ambulatory Visit: Payer: Self-pay

## 2023-08-26 ENCOUNTER — Other Ambulatory Visit: Payer: Self-pay

## 2023-08-27 ENCOUNTER — Other Ambulatory Visit: Payer: Self-pay

## 2023-08-31 ENCOUNTER — Telehealth: Payer: Self-pay | Admitting: Family Medicine

## 2023-08-31 NOTE — Telephone Encounter (Signed)
 Pt confirmed appt

## 2023-09-01 ENCOUNTER — Encounter: Payer: Self-pay | Admitting: Family Medicine

## 2023-09-01 ENCOUNTER — Ambulatory Visit: Payer: Self-pay | Attending: Family Medicine | Admitting: Family Medicine

## 2023-09-01 ENCOUNTER — Other Ambulatory Visit: Payer: Self-pay

## 2023-09-01 VITALS — BP 131/73 | HR 61 | Ht 62.0 in | Wt 152.2 lb

## 2023-09-01 DIAGNOSIS — Z7984 Long term (current) use of oral hypoglycemic drugs: Secondary | ICD-10-CM

## 2023-09-01 DIAGNOSIS — I10 Essential (primary) hypertension: Secondary | ICD-10-CM

## 2023-09-01 DIAGNOSIS — E1169 Type 2 diabetes mellitus with other specified complication: Secondary | ICD-10-CM

## 2023-09-01 DIAGNOSIS — Z7985 Long-term (current) use of injectable non-insulin antidiabetic drugs: Secondary | ICD-10-CM

## 2023-09-01 DIAGNOSIS — E782 Mixed hyperlipidemia: Secondary | ICD-10-CM

## 2023-09-01 DIAGNOSIS — Z23 Encounter for immunization: Secondary | ICD-10-CM

## 2023-09-01 LAB — POCT GLYCOSYLATED HEMOGLOBIN (HGB A1C): HbA1c, POC (controlled diabetic range): 6.1 % (ref 0.0–7.0)

## 2023-09-01 MED ORDER — GLIPIZIDE 5 MG PO TABS
5.0000 mg | ORAL_TABLET | Freq: Two times a day (BID) | ORAL | 1 refills | Status: DC
  Filled 2023-09-01: qty 180, 90d supply, fill #0
  Filled 2023-09-26 – 2023-11-15 (×3): qty 180, 90d supply, fill #1

## 2023-09-01 NOTE — Progress Notes (Signed)
 Subjective:  Patient ID: Margaret Pace, female    DOB: 11-04-1965  Age: 58 y.o. MRN: [id number]  CC: Medical Management of Chronic Issues     Discussed the use of AI scribe software for clinical note transcription with the patient, who gave verbal consent to proceed.  History of Present Illness Margaret Pace is a 58 year old female with  a history of type 2 diabetes mellitus , hypertension, hyperlipidemia who presents for a follow-up visit.  Her A1c has decreased from 7.3 to 6.1. She experiences frequent hypoglycemic episodes with blood sugar levels as low as 30-40 mg/dL. Her current diabetes medications include metformin , Farxiga , Actos , and a weekly Ozempic  injection.  She is on atorvastatin  for cholesterol management and takes lisinopril  for blood pressure control.  Denies presence of additional concerns today.  Past Medical History:  Diagnosis Date   Anemia    Pt reported blood transfusion after left leg fracture.   Carpal tunnel syndrome, bilateral    Diabetes mellitus without complication [facility])     Past Surgical History:  Procedure Laterality Date   APPLICATION OF WOUND VAC Left 05/14/2014   Procedure: APPLICATION OF WOUND VAC;  Surgeon: Ozell Bruch, MD;  Location: [facility] OR;  Service: Orthopedics;  Laterality: Left;   FASCIOTOMY Left 05/14/2014   Procedure: FOUR COMPARTMENT FASCIOTOMY;  Surgeon: Ozell Bruch, MD;  Location: [facility] OR;  Service: Orthopedics;  Laterality: Left;   FRACTURE SURGERY     HARDWARE REMOVAL Left 10/19/2014   Procedure: HARDWARE REMOVAL LEFT TIBIA AND ANKLE;  Surgeon: Ozell Bruch, MD;  Location: [facility] OR;  Service: Orthopedics;  Laterality: Left;   SECONDARY CLOSURE OF WOUND Left 05/17/2014   Procedure: WOUND CLOSURE LEFT LEG ;  Surgeon: Ozell Bruch, MD;  Location: [facility] OR;  Service: Orthopedics;  Laterality: Left;   TIBIA IM NAIL INSERTION Left 05/14/2014   Procedure: INTRAMEDULLARY (IM) NAIL TIBIAL;  Surgeon: Ozell Bruch, MD;  Location: [facility]  OR;  Service: Orthopedics;  Laterality: Left;    Family History  Problem Relation Age of Onset   Diabetes Mother    Diabetes Sister    Diabetes Sister    Diabetes Sister    Diabetes Brother    Diabetes Brother    Breast cancer Neg Hx     Social History   Socioeconomic History   Marital status: Legally Separated    Spouse name: Not on file   Number of children: Not on file   Years of education: Not on file   Highest education level: Not on file  Occupational History   Not on file  Tobacco Use   Smoking status: Never   Smokeless tobacco: Never  Vaping Use   Vaping status: Never Used  Substance and Sexual Activity   Alcohol  use: No    Alcohol /week: 0.0 standard drinks of alcohol    Drug use: No   Sexual activity: Not Currently  Other Topics Concern   Not on file  Social History Narrative   Not on file   Social Drivers of Health   Financial Resource Strain: Patient Declined (11/25/2022)   Overall Financial Resource Strain (CARDIA)    Difficulty of Paying Living Expenses: Patient declined  Food Insecurity: Patient Declined (11/25/2022)   Hunger Vital Sign    Worried About Running Out of Food in the Last Year: Patient declined    Ran Out of Food in the Last Year: Patient declined  Transportation Needs: No Transportation Needs (11/25/2022)   PRAPARE - Transportation    Lack  of Transportation (Medical): No    Lack of Transportation (Non-Medical): No  Physical Activity: Patient Declined (11/25/2022)   Exercise Vital Sign    Days of Exercise per Week: Patient declined    Minutes of Exercise per Session: Patient declined  Stress: Patient Declined (11/25/2022)   [employer] of Occupational Health - Occupational Stress Questionnaire    Feeling of Stress : Patient declined  Social Connections: Patient Declined (11/25/2022)   Social Connection and Isolation Panel    Frequency of Communication with Friends and Family: Patient declined    Frequency of Social  Gatherings with Friends and Family: Patient declined    Attends Religious Services: Patient declined    Database administrator or Organizations: Patient declined    Attends Banker Meetings: Patient declined    Marital Status: Patient declined    No Known Allergies  Outpatient Medications Prior to Visit  Medication Sig Dispense Refill   atorvastatin  (LIPITOR) 80 MG tablet Take 1 tablet (80 mg total) by mouth daily. 30 tablet 6   Blood Glucose Monitoring Suppl (TRUE METRIX METER) DEVI 1 each by Does not apply route 3 (three) times daily before meals. 1 Device 0   diclofenac  Sodium (VOLTAREN ) 1 % GEL Apply 4 g topically 4 (four) times daily. 100 g 1   glucose blood test strip USE AS DIRECTED THREE TIMES DAILY 100 each 12   Insulin  Pen Needle 31G X 5 MM MISC 1 each by Does not apply route at bedtime. 100 each 1   lisinopril  (ZESTRIL ) 2.5 MG tablet Take 1 tablet (2.5 mg total) by mouth daily. 30 tablet 6   loratadine  (CLARITIN ) 10 MG tablet Take 1 tablet (10 mg total) by mouth daily. 30 tablet 0   metFORMIN  (GLUCOPHAGE ) 1000 MG tablet Take 1 tablet (1,000 mg total) by mouth 2 (two) times daily with a meal. 60 tablet 6   Misc. Devices MISC Patient with Diabetes needing annual eye exam 1 each 0   naproxen  (NAPROSYN ) 500 MG tablet Take 1 tablet (500 mg total) by mouth 2 (two) times daily with a meal. 14 tablet 0   ondansetron  (ZOFRAN ) 4 MG tablet Take 1 tablet (4 mg total) by mouth every 6 (six) hours. 15 tablet 0   pioglitazone  (ACTOS ) 15 MG tablet Take 1 tablet (15 mg total) by mouth daily. 30 tablet 6   Semaglutide , 2 MG/DOSE, 8 MG/3ML SOPN Inject 2 mg as directed once a week. 3 mL 6   TRUEPLUS LANCETS 28G MISC 1 each by Does not apply route 3 (three) times daily before meals. 100 each 12   glipiZIDE  (GLUCOTROL ) 10 MG tablet Take 1 tablet (10 mg total) by mouth 2 (two) times daily before a meal. 60 tablet 6   amoxicillin -clavulanate (AUGMENTIN ) 875-125 MG tablet Take 1 tablet by  mouth every 12 (twelve) hours. (Patient not taking: Reported on 09/01/2023) 14 tablet 0   oxyCODONE  (ROXICODONE ) 5 MG immediate release tablet Take 1 tablet (5 mg total) by mouth every 6 (six) hours as needed. (Patient not taking: Reported on 09/01/2023) 9 tablet 0   No facility-administered medications prior to visit.     ROS Review of Systems  Constitutional:  Negative for activity change and appetite change.  HENT:  Negative for sinus pressure and sore throat.   Respiratory:  Negative for chest tightness, shortness of breath and wheezing.   Cardiovascular:  Negative for chest pain and palpitations.  Gastrointestinal:  Negative for abdominal distention, abdominal pain and constipation.  Genitourinary: Negative.   Musculoskeletal: Negative.   Psychiatric/Behavioral:  Negative for behavioral problems and dysphoric mood.     Objective:  BP 131/73   Pulse 61   Ht 5' 2 (1.575 m)   Wt 152 lb 3.2 oz (69 kg)   LMP 11/12/2013   SpO2 100%   BMI 27.84 kg/m      09/01/2023   10:14 AM 04/17/2023    7:28 PM 04/17/2023    4:19 PM  BP/Weight  Systolic BP 131 135   Diastolic BP 73 74   Wt. (Lbs) 152.2  150  BMI 27.84 kg/m2  27.44 kg/m2      Physical Exam Constitutional:      Appearance: She is well-developed.  Cardiovascular:     Rate and Rhythm: Normal rate.     Heart sounds: Normal heart sounds. No murmur heard. Pulmonary:     Effort: Pulmonary effort is normal.     Breath sounds: Normal breath sounds. No wheezing or rales.  Chest:     Chest wall: No tenderness.  Abdominal:     General: Bowel sounds are normal. There is no distension.     Palpations: Abdomen is soft. There is no mass.     Tenderness: There is no abdominal tenderness.  Musculoskeletal:        General: Normal range of motion.     Right lower leg: No edema.     Left lower leg: No edema.  Neurological:     Mental Status: She is alert and oriented to person, place, and time.  Psychiatric:        Mood and  Affect: Mood normal.        Latest Ref Rng & Units 03/03/2023    9:39 AM 09/08/2022    9:50 AM 03/18/2022   11:03 AM  CMP  Glucose 70 - 99 mg/dL 53  76  844   BUN 6 - 24 mg/dL 15  19  11    Creatinine 0.57 - 1.00 mg/dL 9.00  9.03  9.16   Sodium 134 - 144 mmol/L 144  141  142   Potassium 3.5 - 5.2 mmol/L 4.4  4.7  4.6   Chloride 96 - 106 mmol/L 106  106  104   CO2 20 - 29 mmol/L 21  22  21    Calcium  8.7 - 10.2 mg/dL 89.9  9.4  89.9   Total Protein 6.0 - 8.5 g/dL 7.1  6.8  7.1   Total Bilirubin 0.0 - 1.2 mg/dL 0.3  0.3  0.3   Alkaline Phos 44 - 121 IU/L 128  127  165   AST 0 - 40 IU/L 19  19  15    ALT 0 - 32 IU/L 11  11  10      Lipid Panel     Component Value Date/Time   CHOL 97 (L) 03/03/2023 0939   TRIG 97 03/03/2023 0939   HDL 43 03/03/2023 0939   CHOLHDL 2.9 04/22/2020 1205   CHOLHDL 3.3 04/14/2016 0928   VLDL 45 (H) 09/25/2015 1144   LDLCALC 35 03/03/2023 0939    CBC    Component Value Date/Time   WBC 3.4 (L) 08/16/2021 0106   RBC 3.31 (L) 08/16/2021 0106   HGB 9.6 (L) 08/16/2021 0106   HCT 29.5 (L) 08/16/2021 0106   PLT 165 08/16/2021 0106   MCV 89.1 08/16/2021 0106   MCH 29.0 08/16/2021 0106   MCHC 32.5 08/16/2021 0106   RDW 14.2 08/16/2021 0106   LYMPHSABS 0.9  08/16/2021 0106   MONOABS 0.3 08/16/2021 0106   EOSABS 0.0 08/16/2021 0106   BASOSABS 0.0 08/16/2021 0106    Lab Results  Component Value Date   HGBA1C 6.1 09/01/2023    Lab Results  Component Value Date   HGBA1C 6.1 09/01/2023   HGBA1C 7.3 (A) 03/03/2023   HGBA1C 8.2 (A) 11/25/2022       1. Type 2 diabetes mellitus with other specified complication, without long-term current use of insulin  (HCC) (Primary) Controlled with A1c of 6.1 but she is experiencing hypoglycemia Glipizide  dose decreased from 10 mg to 5 mg twice daily Continue metformin , Farxiga , Actos  and Ozempic  Counseled on Diabetic diet, the healthy plate, 849 minutes of moderate intensity exercise/week Blood sugar logs with  fasting goals of 80-120 mg/dl, random of less than 819 and in the event of sugars less than 60 mg/dl or greater than 599 mg/dl encouraged to notify the clinic. Advised on the need for annual eye exams, annual foot exams, Pneumonia vaccine. - POCT glycosylated hemoglobin (Hb A1C) - glipiZIDE  (GLUCOTROL ) 5 MG tablet; Take 1 tablet (5 mg total) by mouth 2 (two) times daily.  Dispense: 180 tablet; Refill: 1 - Basic Metabolic Panel  2. Mixed hyperlipidemia Controlled Continue atorvastatin  Low-cholesterol diet  3. Essential hypertension Controlled Continue lisinopril  Counseled on blood pressure goal of less than 130/80, low-sodium, DASH diet, medication compliance, 150 minutes of moderate intensity exercise per week. Discussed medication compliance, adverse effects.   4. Need for Streptococcus pneumoniae vaccination - Pneumococcal conjugate vaccine 20-valent  Meds ordered this encounter  Medications   glipiZIDE  (GLUCOTROL ) 5 MG tablet    Sig: Take 1 tablet (5 mg total) by mouth 2 (two) times daily.    Dispense:  180 tablet    Refill:  1    Dose decrease    Follow-up: Return in about 6 months (around 03/03/2024) for Chronic medical conditions.       Corrina Sabin, MD, FAAFP. [facility] [place], [place] [phone]   09/01/2023, 12:26 PM

## 2023-09-01 NOTE — Patient Instructions (Signed)
 VISIT SUMMARY:  Margaret Pace, you had a follow-up visit today to review your diabetes management, cholesterol, and blood pressure. Your A1c has improved significantly, but you are experiencing frequent low blood sugar episodes. We discussed adjustments to your medications and general health maintenance.  YOUR PLAN:  -TYPE 2 DIABETES MELLITUS: Type 2 Diabetes Mellitus is a condition where your body does not use insulin  properly, leading to high blood sugar levels. Your A1c has improved to 6.1, but you are having frequent low blood sugar episodes. We will reduce your Glipizide  dose to 5 mg. Please take half of your 10 mg tablets until your new prescription is filled. Continue taking Metformin , Pioglitazone , and Semaglutide  at their current doses.  -HYPERLIPIDEMIA: Hyperlipidemia is a condition with high levels of fats (lipids) in your blood. Your cholesterol is well controlled with atorvastatin . Continue taking atorvastatin  at your current dose.  -HYPERTENSION: Hypertension is high blood pressure. Your blood pressure is stable with lisinopril . Continue taking lisinopril  at your current dose.  -GENERAL HEALTH MAINTENANCE: You are due for a pneumonia vaccine to help prevent infections, which is especially important in diabetes. We will administer the pneumonia vaccine today.  INSTRUCTIONS:  Please follow up as needed for any concerns or if you experience any more frequent or severe hypoglycemic episodes. Make sure to get your new Glipizide  prescription filled. Continue with your current medications and lifestyle as discussed.

## 2023-09-02 ENCOUNTER — Ambulatory Visit: Payer: Self-pay | Admitting: Family Medicine

## 2023-09-02 LAB — BASIC METABOLIC PANEL WITH GFR
BUN/Creatinine Ratio: 24 — ABNORMAL HIGH (ref 9–23)
BUN: 25 mg/dL — ABNORMAL HIGH (ref 6–24)
CO2: 21 mmol/L (ref 20–29)
Calcium: 10 mg/dL (ref 8.7–10.2)
Chloride: 103 mmol/L (ref 96–106)
Creatinine, Ser: 1.06 mg/dL — ABNORMAL HIGH (ref 0.57–1.00)
Glucose: 65 mg/dL — ABNORMAL LOW (ref 70–99)
Potassium: 5 mmol/L (ref 3.5–5.2)
Sodium: 139 mmol/L (ref 134–144)
eGFR: 61 mL/min/1.73 (ref >59)

## 2023-09-27 ENCOUNTER — Other Ambulatory Visit: Payer: Self-pay

## 2023-09-29 ENCOUNTER — Other Ambulatory Visit: Payer: Self-pay

## 2023-11-01 ENCOUNTER — Other Ambulatory Visit: Payer: Self-pay

## 2023-11-05 ENCOUNTER — Other Ambulatory Visit: Payer: Self-pay

## 2023-11-14 ENCOUNTER — Other Ambulatory Visit: Payer: Self-pay | Admitting: Family Medicine

## 2023-11-14 DIAGNOSIS — E1169 Type 2 diabetes mellitus with other specified complication: Secondary | ICD-10-CM

## 2023-11-14 DIAGNOSIS — I10 Essential (primary) hypertension: Secondary | ICD-10-CM

## 2023-11-15 ENCOUNTER — Other Ambulatory Visit: Payer: Self-pay

## 2023-11-15 MED ORDER — PIOGLITAZONE HCL 15 MG PO TABS
15.0000 mg | ORAL_TABLET | Freq: Every day | ORAL | 6 refills | Status: AC
  Filled 2023-11-15: qty 30, 30d supply, fill #0
  Filled 2023-12-26 – 2023-12-27 (×2): qty 30, 30d supply, fill #1
  Filled 2024-02-18 – 2024-02-21 (×2): qty 30, 30d supply, fill #2

## 2023-11-15 MED ORDER — METFORMIN HCL 1000 MG PO TABS
1000.0000 mg | ORAL_TABLET | Freq: Two times a day (BID) | ORAL | 6 refills | Status: AC
  Filled 2023-11-15: qty 60, 30d supply, fill #0
  Filled 2023-12-26 – 2023-12-27 (×2): qty 60, 30d supply, fill #1
  Filled 2024-02-18 – 2024-02-21 (×2): qty 60, 30d supply, fill #2

## 2023-11-15 MED ORDER — LISINOPRIL 2.5 MG PO TABS
2.5000 mg | ORAL_TABLET | Freq: Every day | ORAL | 6 refills | Status: AC
  Filled 2023-11-15: qty 30, 30d supply, fill #0
  Filled 2023-12-26: qty 30, 30d supply, fill #1
  Filled 2024-02-18 – 2024-02-21 (×2): qty 30, 30d supply, fill #2

## 2023-11-16 ENCOUNTER — Other Ambulatory Visit: Payer: Self-pay

## 2023-11-17 ENCOUNTER — Other Ambulatory Visit: Payer: Self-pay

## 2023-12-26 ENCOUNTER — Other Ambulatory Visit: Payer: Self-pay | Admitting: Family Medicine

## 2023-12-27 ENCOUNTER — Other Ambulatory Visit: Payer: Self-pay

## 2023-12-28 ENCOUNTER — Other Ambulatory Visit: Payer: Self-pay

## 2023-12-28 MED ORDER — OZEMPIC (2 MG/DOSE) 8 MG/3ML ~~LOC~~ SOPN
2.0000 mg | PEN_INJECTOR | SUBCUTANEOUS | 6 refills | Status: AC
  Filled 2023-12-28: qty 3, 28d supply, fill #0
  Filled 2024-02-14: qty 12, 112d supply, fill #1

## 2023-12-31 ENCOUNTER — Other Ambulatory Visit: Payer: Self-pay

## 2024-02-14 ENCOUNTER — Other Ambulatory Visit: Payer: Self-pay

## 2024-02-18 ENCOUNTER — Other Ambulatory Visit: Payer: Self-pay | Admitting: Family Medicine

## 2024-02-18 DIAGNOSIS — E1169 Type 2 diabetes mellitus with other specified complication: Secondary | ICD-10-CM

## 2024-02-20 MED ORDER — GLIPIZIDE 5 MG PO TABS
5.0000 mg | ORAL_TABLET | Freq: Two times a day (BID) | ORAL | 0 refills | Status: AC
  Filled 2024-02-20 – 2024-02-21 (×2): qty 180, 90d supply, fill #0

## 2024-02-21 ENCOUNTER — Other Ambulatory Visit: Payer: Self-pay

## 2024-03-06 ENCOUNTER — Ambulatory Visit: Payer: Self-pay | Admitting: Family Medicine

## 2024-04-11 ENCOUNTER — Ambulatory Visit: Payer: Self-pay | Admitting: Family Medicine
